# Patient Record
Sex: Male | Born: 1945 | Race: White | Hispanic: No | Marital: Married | Smoking: Former smoker
Health system: Southern US, Community
[De-identification: ages and names within clinical notes are randomized; demographics above are authoritative.]

## PROBLEM LIST (undated history)

## (undated) DIAGNOSIS — Z8489 Family history of other specified conditions: Secondary | ICD-10-CM

## (undated) DIAGNOSIS — K219 Gastro-esophageal reflux disease without esophagitis: Secondary | ICD-10-CM

## (undated) DIAGNOSIS — M549 Dorsalgia, unspecified: Secondary | ICD-10-CM

## (undated) DIAGNOSIS — N400 Enlarged prostate without lower urinary tract symptoms: Secondary | ICD-10-CM

## (undated) DIAGNOSIS — G8929 Other chronic pain: Secondary | ICD-10-CM

## (undated) DIAGNOSIS — M1990 Unspecified osteoarthritis, unspecified site: Secondary | ICD-10-CM

## (undated) DIAGNOSIS — E785 Hyperlipidemia, unspecified: Secondary | ICD-10-CM

## (undated) DIAGNOSIS — I10 Essential (primary) hypertension: Secondary | ICD-10-CM

## (undated) DIAGNOSIS — J189 Pneumonia, unspecified organism: Secondary | ICD-10-CM

## (undated) DIAGNOSIS — M4800 Spinal stenosis, site unspecified: Secondary | ICD-10-CM

## (undated) HISTORY — PX: COLONOSCOPY: SHX174

## (undated) HISTORY — DX: Essential (primary) hypertension: I10

## (undated) HISTORY — PX: FINGER SURGERY: SHX640

## (undated) HISTORY — DX: Hyperlipidemia, unspecified: E78.5

## (undated) HISTORY — DX: Other chronic pain: G89.29

## (undated) HISTORY — DX: Spinal stenosis, site unspecified: M48.00

## (undated) HISTORY — DX: Benign prostatic hyperplasia without lower urinary tract symptoms: N40.0

## (undated) HISTORY — DX: Dorsalgia, unspecified: M54.9

---

## 2000-04-04 ENCOUNTER — Ambulatory Visit (HOSPITAL_COMMUNITY): Admission: RE | Admit: 2000-04-04 | Discharge: 2000-04-04 | Payer: Self-pay | Admitting: Family Medicine

## 2000-04-04 ENCOUNTER — Encounter: Payer: Self-pay | Admitting: Family Medicine

## 2005-02-25 ENCOUNTER — Encounter: Admission: RE | Admit: 2005-02-25 | Discharge: 2005-02-25 | Payer: Self-pay | Admitting: Family Medicine

## 2008-06-27 ENCOUNTER — Encounter: Admission: RE | Admit: 2008-06-27 | Discharge: 2008-06-27 | Payer: Self-pay | Admitting: Family Medicine

## 2010-10-18 ENCOUNTER — Encounter: Payer: Self-pay | Admitting: Family Medicine

## 2011-09-02 ENCOUNTER — Ambulatory Visit
Admission: RE | Admit: 2011-09-02 | Discharge: 2011-09-02 | Disposition: A | Discharge status: Home / Self Care | Payer: Medicare Other | Source: Ambulatory Visit | Attending: Family Medicine | Admitting: Family Medicine

## 2011-09-02 ENCOUNTER — Other Ambulatory Visit: Payer: Self-pay | Admitting: Family Medicine

## 2011-09-02 DIAGNOSIS — M545 Low back pain, unspecified: Secondary | ICD-10-CM

## 2011-11-03 ENCOUNTER — Other Ambulatory Visit: Payer: Self-pay | Admitting: Family Medicine

## 2011-11-03 DIAGNOSIS — M545 Low back pain, unspecified: Secondary | ICD-10-CM | POA: Diagnosis not present

## 2011-11-03 DIAGNOSIS — M5430 Sciatica, unspecified side: Secondary | ICD-10-CM | POA: Diagnosis not present

## 2011-11-10 ENCOUNTER — Ambulatory Visit
Admission: RE | Admit: 2011-11-10 | Discharge: 2011-11-10 | Disposition: A | Discharge status: Home / Self Care | Payer: Medicare Other | Source: Ambulatory Visit | Attending: Family Medicine | Admitting: Family Medicine

## 2011-11-10 DIAGNOSIS — M5430 Sciatica, unspecified side: Secondary | ICD-10-CM

## 2011-11-10 DIAGNOSIS — M5137 Other intervertebral disc degeneration, lumbosacral region: Secondary | ICD-10-CM | POA: Diagnosis not present

## 2011-11-10 DIAGNOSIS — M47817 Spondylosis without myelopathy or radiculopathy, lumbosacral region: Secondary | ICD-10-CM | POA: Diagnosis not present

## 2011-11-10 DIAGNOSIS — M5126 Other intervertebral disc displacement, lumbar region: Secondary | ICD-10-CM | POA: Diagnosis not present

## 2011-11-22 DIAGNOSIS — M545 Low back pain, unspecified: Secondary | ICD-10-CM | POA: Diagnosis not present

## 2011-11-22 DIAGNOSIS — M5137 Other intervertebral disc degeneration, lumbosacral region: Secondary | ICD-10-CM | POA: Diagnosis not present

## 2011-11-22 DIAGNOSIS — IMO0002 Reserved for concepts with insufficient information to code with codable children: Secondary | ICD-10-CM | POA: Diagnosis not present

## 2011-11-22 DIAGNOSIS — M48061 Spinal stenosis, lumbar region without neurogenic claudication: Secondary | ICD-10-CM | POA: Diagnosis not present

## 2011-11-22 DIAGNOSIS — M47817 Spondylosis without myelopathy or radiculopathy, lumbosacral region: Secondary | ICD-10-CM | POA: Diagnosis not present

## 2012-01-17 DIAGNOSIS — M5137 Other intervertebral disc degeneration, lumbosacral region: Secondary | ICD-10-CM | POA: Diagnosis not present

## 2012-01-17 DIAGNOSIS — IMO0002 Reserved for concepts with insufficient information to code with codable children: Secondary | ICD-10-CM | POA: Diagnosis not present

## 2012-01-17 DIAGNOSIS — M47817 Spondylosis without myelopathy or radiculopathy, lumbosacral region: Secondary | ICD-10-CM | POA: Diagnosis not present

## 2012-01-17 DIAGNOSIS — M48061 Spinal stenosis, lumbar region without neurogenic claudication: Secondary | ICD-10-CM | POA: Diagnosis not present

## 2012-02-02 DIAGNOSIS — M545 Low back pain, unspecified: Secondary | ICD-10-CM | POA: Diagnosis not present

## 2012-02-02 DIAGNOSIS — M48061 Spinal stenosis, lumbar region without neurogenic claudication: Secondary | ICD-10-CM | POA: Diagnosis not present

## 2012-02-02 DIAGNOSIS — M5137 Other intervertebral disc degeneration, lumbosacral region: Secondary | ICD-10-CM | POA: Diagnosis not present

## 2012-02-02 DIAGNOSIS — M47817 Spondylosis without myelopathy or radiculopathy, lumbosacral region: Secondary | ICD-10-CM | POA: Diagnosis not present

## 2012-02-02 DIAGNOSIS — M4310 Spondylolisthesis, site unspecified: Secondary | ICD-10-CM | POA: Diagnosis not present

## 2012-02-16 DIAGNOSIS — M4310 Spondylolisthesis, site unspecified: Secondary | ICD-10-CM | POA: Diagnosis not present

## 2012-02-16 DIAGNOSIS — M48061 Spinal stenosis, lumbar region without neurogenic claudication: Secondary | ICD-10-CM | POA: Diagnosis not present

## 2012-02-16 DIAGNOSIS — M5137 Other intervertebral disc degeneration, lumbosacral region: Secondary | ICD-10-CM | POA: Diagnosis not present

## 2012-02-16 DIAGNOSIS — M47817 Spondylosis without myelopathy or radiculopathy, lumbosacral region: Secondary | ICD-10-CM | POA: Diagnosis not present

## 2012-02-23 DIAGNOSIS — I10 Essential (primary) hypertension: Secondary | ICD-10-CM | POA: Diagnosis not present

## 2012-02-23 DIAGNOSIS — Z125 Encounter for screening for malignant neoplasm of prostate: Secondary | ICD-10-CM | POA: Diagnosis not present

## 2012-02-23 DIAGNOSIS — M545 Low back pain, unspecified: Secondary | ICD-10-CM | POA: Diagnosis not present

## 2012-02-23 DIAGNOSIS — Z23 Encounter for immunization: Secondary | ICD-10-CM | POA: Diagnosis not present

## 2012-02-23 DIAGNOSIS — R7301 Impaired fasting glucose: Secondary | ICD-10-CM | POA: Diagnosis not present

## 2012-02-23 DIAGNOSIS — E782 Mixed hyperlipidemia: Secondary | ICD-10-CM | POA: Diagnosis not present

## 2012-02-23 DIAGNOSIS — Z0000 Encounter for general adult medical examination without abnormal findings: Secondary | ICD-10-CM | POA: Diagnosis not present

## 2012-02-23 DIAGNOSIS — Z1211 Encounter for screening for malignant neoplasm of colon: Secondary | ICD-10-CM | POA: Diagnosis not present

## 2012-03-16 DIAGNOSIS — Z87891 Personal history of nicotine dependence: Secondary | ICD-10-CM | POA: Diagnosis not present

## 2012-04-06 DIAGNOSIS — Z86010 Personal history of colonic polyps: Secondary | ICD-10-CM | POA: Diagnosis not present

## 2012-04-10 DIAGNOSIS — M48061 Spinal stenosis, lumbar region without neurogenic claudication: Secondary | ICD-10-CM | POA: Diagnosis not present

## 2012-07-04 DIAGNOSIS — H5310 Unspecified subjective visual disturbances: Secondary | ICD-10-CM | POA: Diagnosis not present

## 2012-07-04 DIAGNOSIS — H5210 Myopia, unspecified eye: Secondary | ICD-10-CM | POA: Diagnosis not present

## 2012-07-04 DIAGNOSIS — H259 Unspecified age-related cataract: Secondary | ICD-10-CM | POA: Diagnosis not present

## 2012-07-04 DIAGNOSIS — H524 Presbyopia: Secondary | ICD-10-CM | POA: Diagnosis not present

## 2012-08-07 DIAGNOSIS — H25019 Cortical age-related cataract, unspecified eye: Secondary | ICD-10-CM | POA: Diagnosis not present

## 2012-08-07 DIAGNOSIS — H269 Unspecified cataract: Secondary | ICD-10-CM | POA: Diagnosis not present

## 2012-08-07 DIAGNOSIS — H2510 Age-related nuclear cataract, unspecified eye: Secondary | ICD-10-CM | POA: Diagnosis not present

## 2012-08-07 DIAGNOSIS — H2181 Floppy iris syndrome: Secondary | ICD-10-CM | POA: Diagnosis not present

## 2012-09-11 DIAGNOSIS — E782 Mixed hyperlipidemia: Secondary | ICD-10-CM | POA: Diagnosis not present

## 2012-09-11 DIAGNOSIS — I10 Essential (primary) hypertension: Secondary | ICD-10-CM | POA: Diagnosis not present

## 2012-09-11 DIAGNOSIS — E669 Obesity, unspecified: Secondary | ICD-10-CM | POA: Diagnosis not present

## 2012-09-11 DIAGNOSIS — R7301 Impaired fasting glucose: Secondary | ICD-10-CM | POA: Diagnosis not present

## 2013-05-15 DIAGNOSIS — H259 Unspecified age-related cataract: Secondary | ICD-10-CM | POA: Diagnosis not present

## 2013-05-15 DIAGNOSIS — Z961 Presence of intraocular lens: Secondary | ICD-10-CM | POA: Diagnosis not present

## 2013-05-15 DIAGNOSIS — H04129 Dry eye syndrome of unspecified lacrimal gland: Secondary | ICD-10-CM | POA: Diagnosis not present

## 2013-05-15 DIAGNOSIS — H5231 Anisometropia: Secondary | ICD-10-CM | POA: Diagnosis not present

## 2013-05-22 DIAGNOSIS — S61209A Unspecified open wound of unspecified finger without damage to nail, initial encounter: Secondary | ICD-10-CM | POA: Diagnosis not present

## 2013-05-22 DIAGNOSIS — Z1211 Encounter for screening for malignant neoplasm of colon: Secondary | ICD-10-CM | POA: Diagnosis not present

## 2013-05-22 DIAGNOSIS — Z23 Encounter for immunization: Secondary | ICD-10-CM | POA: Diagnosis not present

## 2013-05-22 DIAGNOSIS — R7301 Impaired fasting glucose: Secondary | ICD-10-CM | POA: Diagnosis not present

## 2013-05-22 DIAGNOSIS — E782 Mixed hyperlipidemia: Secondary | ICD-10-CM | POA: Diagnosis not present

## 2013-05-22 DIAGNOSIS — Z1331 Encounter for screening for depression: Secondary | ICD-10-CM | POA: Diagnosis not present

## 2013-05-22 DIAGNOSIS — I10 Essential (primary) hypertension: Secondary | ICD-10-CM | POA: Diagnosis not present

## 2013-05-22 DIAGNOSIS — Z125 Encounter for screening for malignant neoplasm of prostate: Secondary | ICD-10-CM | POA: Diagnosis not present

## 2013-05-22 DIAGNOSIS — Z0000 Encounter for general adult medical examination without abnormal findings: Secondary | ICD-10-CM | POA: Diagnosis not present

## 2013-05-28 DIAGNOSIS — Z1211 Encounter for screening for malignant neoplasm of colon: Secondary | ICD-10-CM | POA: Diagnosis not present

## 2013-08-07 DIAGNOSIS — I10 Essential (primary) hypertension: Secondary | ICD-10-CM | POA: Diagnosis not present

## 2013-08-07 DIAGNOSIS — R42 Dizziness and giddiness: Secondary | ICD-10-CM | POA: Diagnosis not present

## 2013-11-18 DIAGNOSIS — H5231 Anisometropia: Secondary | ICD-10-CM | POA: Diagnosis not present

## 2013-11-18 DIAGNOSIS — H259 Unspecified age-related cataract: Secondary | ICD-10-CM | POA: Diagnosis not present

## 2013-11-18 DIAGNOSIS — H524 Presbyopia: Secondary | ICD-10-CM | POA: Diagnosis not present

## 2013-11-18 DIAGNOSIS — Z961 Presence of intraocular lens: Secondary | ICD-10-CM | POA: Diagnosis not present

## 2013-11-25 DIAGNOSIS — I10 Essential (primary) hypertension: Secondary | ICD-10-CM | POA: Diagnosis not present

## 2013-11-25 DIAGNOSIS — R7301 Impaired fasting glucose: Secondary | ICD-10-CM | POA: Diagnosis not present

## 2013-11-25 DIAGNOSIS — Z23 Encounter for immunization: Secondary | ICD-10-CM | POA: Diagnosis not present

## 2013-11-25 DIAGNOSIS — E782 Mixed hyperlipidemia: Secondary | ICD-10-CM | POA: Diagnosis not present

## 2013-12-03 DIAGNOSIS — H25049 Posterior subcapsular polar age-related cataract, unspecified eye: Secondary | ICD-10-CM | POA: Diagnosis not present

## 2013-12-03 DIAGNOSIS — H2510 Age-related nuclear cataract, unspecified eye: Secondary | ICD-10-CM | POA: Diagnosis not present

## 2013-12-03 DIAGNOSIS — H25019 Cortical age-related cataract, unspecified eye: Secondary | ICD-10-CM | POA: Diagnosis not present

## 2013-12-03 DIAGNOSIS — H269 Unspecified cataract: Secondary | ICD-10-CM | POA: Diagnosis not present

## 2014-05-27 DIAGNOSIS — J029 Acute pharyngitis, unspecified: Secondary | ICD-10-CM | POA: Diagnosis not present

## 2014-05-27 DIAGNOSIS — I10 Essential (primary) hypertension: Secondary | ICD-10-CM | POA: Diagnosis not present

## 2014-05-27 DIAGNOSIS — J019 Acute sinusitis, unspecified: Secondary | ICD-10-CM | POA: Diagnosis not present

## 2014-06-03 DIAGNOSIS — Z1211 Encounter for screening for malignant neoplasm of colon: Secondary | ICD-10-CM | POA: Diagnosis not present

## 2014-06-03 DIAGNOSIS — R7301 Impaired fasting glucose: Secondary | ICD-10-CM | POA: Diagnosis not present

## 2014-06-03 DIAGNOSIS — Z23 Encounter for immunization: Secondary | ICD-10-CM | POA: Diagnosis not present

## 2014-06-03 DIAGNOSIS — Z125 Encounter for screening for malignant neoplasm of prostate: Secondary | ICD-10-CM | POA: Diagnosis not present

## 2014-06-03 DIAGNOSIS — E782 Mixed hyperlipidemia: Secondary | ICD-10-CM | POA: Diagnosis not present

## 2014-06-03 DIAGNOSIS — R972 Elevated prostate specific antigen [PSA]: Secondary | ICD-10-CM | POA: Diagnosis not present

## 2014-06-03 DIAGNOSIS — I10 Essential (primary) hypertension: Secondary | ICD-10-CM | POA: Diagnosis not present

## 2014-06-03 DIAGNOSIS — Z0000 Encounter for general adult medical examination without abnormal findings: Secondary | ICD-10-CM | POA: Diagnosis not present

## 2014-06-11 DIAGNOSIS — J019 Acute sinusitis, unspecified: Secondary | ICD-10-CM | POA: Diagnosis not present

## 2014-06-11 DIAGNOSIS — H6500 Acute serous otitis media, unspecified ear: Secondary | ICD-10-CM | POA: Diagnosis not present

## 2014-06-11 DIAGNOSIS — I499 Cardiac arrhythmia, unspecified: Secondary | ICD-10-CM | POA: Diagnosis not present

## 2014-07-22 DIAGNOSIS — R972 Elevated prostate specific antigen [PSA]: Secondary | ICD-10-CM | POA: Diagnosis not present

## 2014-08-27 DIAGNOSIS — R972 Elevated prostate specific antigen [PSA]: Secondary | ICD-10-CM | POA: Diagnosis not present

## 2014-09-02 DIAGNOSIS — R972 Elevated prostate specific antigen [PSA]: Secondary | ICD-10-CM | POA: Diagnosis not present

## 2015-01-28 DIAGNOSIS — J018 Other acute sinusitis: Secondary | ICD-10-CM | POA: Diagnosis not present

## 2015-01-28 DIAGNOSIS — R05 Cough: Secondary | ICD-10-CM | POA: Diagnosis not present

## 2015-06-29 DIAGNOSIS — Z23 Encounter for immunization: Secondary | ICD-10-CM | POA: Diagnosis not present

## 2015-06-29 DIAGNOSIS — Z0000 Encounter for general adult medical examination without abnormal findings: Secondary | ICD-10-CM | POA: Diagnosis not present

## 2015-06-29 DIAGNOSIS — L309 Dermatitis, unspecified: Secondary | ICD-10-CM | POA: Diagnosis not present

## 2015-06-29 DIAGNOSIS — Z125 Encounter for screening for malignant neoplasm of prostate: Secondary | ICD-10-CM | POA: Diagnosis not present

## 2015-06-29 DIAGNOSIS — M545 Low back pain: Secondary | ICD-10-CM | POA: Diagnosis not present

## 2015-06-29 DIAGNOSIS — E669 Obesity, unspecified: Secondary | ICD-10-CM | POA: Diagnosis not present

## 2015-06-29 DIAGNOSIS — I10 Essential (primary) hypertension: Secondary | ICD-10-CM | POA: Diagnosis not present

## 2015-06-29 DIAGNOSIS — Z1211 Encounter for screening for malignant neoplasm of colon: Secondary | ICD-10-CM | POA: Diagnosis not present

## 2015-06-29 DIAGNOSIS — N400 Enlarged prostate without lower urinary tract symptoms: Secondary | ICD-10-CM | POA: Diagnosis not present

## 2015-06-29 DIAGNOSIS — E782 Mixed hyperlipidemia: Secondary | ICD-10-CM | POA: Diagnosis not present

## 2015-06-29 DIAGNOSIS — R7301 Impaired fasting glucose: Secondary | ICD-10-CM | POA: Diagnosis not present

## 2015-06-29 DIAGNOSIS — Z6830 Body mass index (BMI) 30.0-30.9, adult: Secondary | ICD-10-CM | POA: Diagnosis not present

## 2015-09-01 DIAGNOSIS — R972 Elevated prostate specific antigen [PSA]: Secondary | ICD-10-CM | POA: Diagnosis not present

## 2015-09-08 DIAGNOSIS — R972 Elevated prostate specific antigen [PSA]: Secondary | ICD-10-CM | POA: Diagnosis not present

## 2015-09-08 DIAGNOSIS — N400 Enlarged prostate without lower urinary tract symptoms: Secondary | ICD-10-CM | POA: Diagnosis not present

## 2016-02-01 DIAGNOSIS — E669 Obesity, unspecified: Secondary | ICD-10-CM | POA: Diagnosis not present

## 2016-02-01 DIAGNOSIS — I10 Essential (primary) hypertension: Secondary | ICD-10-CM | POA: Diagnosis not present

## 2016-02-01 DIAGNOSIS — J45909 Unspecified asthma, uncomplicated: Secondary | ICD-10-CM | POA: Diagnosis not present

## 2016-02-01 DIAGNOSIS — R7301 Impaired fasting glucose: Secondary | ICD-10-CM | POA: Diagnosis not present

## 2016-02-01 DIAGNOSIS — Z6831 Body mass index (BMI) 31.0-31.9, adult: Secondary | ICD-10-CM | POA: Diagnosis not present

## 2016-02-01 DIAGNOSIS — E782 Mixed hyperlipidemia: Secondary | ICD-10-CM | POA: Diagnosis not present

## 2016-06-02 DIAGNOSIS — J029 Acute pharyngitis, unspecified: Secondary | ICD-10-CM | POA: Diagnosis not present

## 2016-06-02 DIAGNOSIS — H66002 Acute suppurative otitis media without spontaneous rupture of ear drum, left ear: Secondary | ICD-10-CM | POA: Diagnosis not present

## 2016-06-07 DIAGNOSIS — J069 Acute upper respiratory infection, unspecified: Secondary | ICD-10-CM | POA: Diagnosis not present

## 2016-06-14 DIAGNOSIS — N411 Chronic prostatitis: Secondary | ICD-10-CM | POA: Diagnosis not present

## 2016-08-31 DIAGNOSIS — E782 Mixed hyperlipidemia: Secondary | ICD-10-CM | POA: Diagnosis not present

## 2016-08-31 DIAGNOSIS — M545 Low back pain: Secondary | ICD-10-CM | POA: Diagnosis not present

## 2016-08-31 DIAGNOSIS — Z0000 Encounter for general adult medical examination without abnormal findings: Secondary | ICD-10-CM | POA: Diagnosis not present

## 2016-08-31 DIAGNOSIS — Z23 Encounter for immunization: Secondary | ICD-10-CM | POA: Diagnosis not present

## 2016-08-31 DIAGNOSIS — R972 Elevated prostate specific antigen [PSA]: Secondary | ICD-10-CM | POA: Diagnosis not present

## 2016-08-31 DIAGNOSIS — E669 Obesity, unspecified: Secondary | ICD-10-CM | POA: Diagnosis not present

## 2016-08-31 DIAGNOSIS — R7301 Impaired fasting glucose: Secondary | ICD-10-CM | POA: Diagnosis not present

## 2016-08-31 DIAGNOSIS — L309 Dermatitis, unspecified: Secondary | ICD-10-CM | POA: Diagnosis not present

## 2016-08-31 DIAGNOSIS — Z1211 Encounter for screening for malignant neoplasm of colon: Secondary | ICD-10-CM | POA: Diagnosis not present

## 2016-08-31 DIAGNOSIS — I10 Essential (primary) hypertension: Secondary | ICD-10-CM | POA: Diagnosis not present

## 2016-08-31 DIAGNOSIS — N400 Enlarged prostate without lower urinary tract symptoms: Secondary | ICD-10-CM | POA: Diagnosis not present

## 2016-09-07 DIAGNOSIS — R972 Elevated prostate specific antigen [PSA]: Secondary | ICD-10-CM | POA: Diagnosis not present

## 2016-09-07 DIAGNOSIS — N400 Enlarged prostate without lower urinary tract symptoms: Secondary | ICD-10-CM | POA: Diagnosis not present

## 2017-03-13 DIAGNOSIS — E782 Mixed hyperlipidemia: Secondary | ICD-10-CM | POA: Diagnosis not present

## 2017-03-13 DIAGNOSIS — D692 Other nonthrombocytopenic purpura: Secondary | ICD-10-CM | POA: Diagnosis not present

## 2017-03-13 DIAGNOSIS — E669 Obesity, unspecified: Secondary | ICD-10-CM | POA: Diagnosis not present

## 2017-03-13 DIAGNOSIS — R7301 Impaired fasting glucose: Secondary | ICD-10-CM | POA: Diagnosis not present

## 2017-03-13 DIAGNOSIS — I10 Essential (primary) hypertension: Secondary | ICD-10-CM | POA: Diagnosis not present

## 2017-07-03 DIAGNOSIS — L570 Actinic keratosis: Secondary | ICD-10-CM | POA: Diagnosis not present

## 2017-07-03 DIAGNOSIS — D1801 Hemangioma of skin and subcutaneous tissue: Secondary | ICD-10-CM | POA: Diagnosis not present

## 2017-07-03 DIAGNOSIS — C44612 Basal cell carcinoma of skin of right upper limb, including shoulder: Secondary | ICD-10-CM | POA: Diagnosis not present

## 2017-07-03 DIAGNOSIS — D692 Other nonthrombocytopenic purpura: Secondary | ICD-10-CM | POA: Diagnosis not present

## 2017-07-03 DIAGNOSIS — D485 Neoplasm of uncertain behavior of skin: Secondary | ICD-10-CM | POA: Diagnosis not present

## 2017-07-03 DIAGNOSIS — L821 Other seborrheic keratosis: Secondary | ICD-10-CM | POA: Diagnosis not present

## 2017-07-25 ENCOUNTER — Other Ambulatory Visit: Payer: Self-pay | Admitting: Family Medicine

## 2017-07-25 DIAGNOSIS — I10 Essential (primary) hypertension: Secondary | ICD-10-CM

## 2017-08-02 ENCOUNTER — Ambulatory Visit
Admission: RE | Admit: 2017-08-02 | Discharge: 2017-08-02 | Disposition: A | Discharge status: Home / Self Care | Payer: Medicare Other | Source: Ambulatory Visit | Attending: Family Medicine | Admitting: Family Medicine

## 2017-08-02 DIAGNOSIS — I10 Essential (primary) hypertension: Secondary | ICD-10-CM

## 2017-08-02 DIAGNOSIS — N281 Cyst of kidney, acquired: Secondary | ICD-10-CM | POA: Diagnosis not present

## 2017-09-25 DIAGNOSIS — Z1211 Encounter for screening for malignant neoplasm of colon: Secondary | ICD-10-CM | POA: Diagnosis not present

## 2017-09-25 DIAGNOSIS — N400 Enlarged prostate without lower urinary tract symptoms: Secondary | ICD-10-CM | POA: Diagnosis not present

## 2017-09-25 DIAGNOSIS — I10 Essential (primary) hypertension: Secondary | ICD-10-CM | POA: Diagnosis not present

## 2017-09-25 DIAGNOSIS — E782 Mixed hyperlipidemia: Secondary | ICD-10-CM | POA: Diagnosis not present

## 2017-09-25 DIAGNOSIS — D692 Other nonthrombocytopenic purpura: Secondary | ICD-10-CM | POA: Diagnosis not present

## 2017-09-25 DIAGNOSIS — R7301 Impaired fasting glucose: Secondary | ICD-10-CM | POA: Diagnosis not present

## 2017-09-25 DIAGNOSIS — M791 Myalgia, unspecified site: Secondary | ICD-10-CM | POA: Diagnosis not present

## 2017-09-25 DIAGNOSIS — Z125 Encounter for screening for malignant neoplasm of prostate: Secondary | ICD-10-CM | POA: Diagnosis not present

## 2017-09-25 DIAGNOSIS — M4800 Spinal stenosis, site unspecified: Secondary | ICD-10-CM | POA: Diagnosis not present

## 2017-09-25 DIAGNOSIS — Z0000 Encounter for general adult medical examination without abnormal findings: Secondary | ICD-10-CM | POA: Diagnosis not present

## 2017-09-25 DIAGNOSIS — Z1159 Encounter for screening for other viral diseases: Secondary | ICD-10-CM | POA: Diagnosis not present

## 2017-09-25 DIAGNOSIS — E669 Obesity, unspecified: Secondary | ICD-10-CM | POA: Diagnosis not present

## 2017-09-25 DIAGNOSIS — R29898 Other symptoms and signs involving the musculoskeletal system: Secondary | ICD-10-CM | POA: Diagnosis not present

## 2017-10-12 DIAGNOSIS — R69 Illness, unspecified: Secondary | ICD-10-CM | POA: Diagnosis not present

## 2017-11-06 DIAGNOSIS — R69 Illness, unspecified: Secondary | ICD-10-CM | POA: Diagnosis not present

## 2018-01-02 DIAGNOSIS — M546 Pain in thoracic spine: Secondary | ICD-10-CM | POA: Diagnosis not present

## 2018-01-02 DIAGNOSIS — M25552 Pain in left hip: Secondary | ICD-10-CM | POA: Diagnosis not present

## 2018-01-02 DIAGNOSIS — M4156 Other secondary scoliosis, lumbar region: Secondary | ICD-10-CM | POA: Diagnosis not present

## 2018-01-02 DIAGNOSIS — M549 Dorsalgia, unspecified: Secondary | ICD-10-CM | POA: Diagnosis not present

## 2018-01-02 DIAGNOSIS — M47816 Spondylosis without myelopathy or radiculopathy, lumbar region: Secondary | ICD-10-CM | POA: Diagnosis not present

## 2018-01-02 DIAGNOSIS — M5136 Other intervertebral disc degeneration, lumbar region: Secondary | ICD-10-CM | POA: Diagnosis not present

## 2018-01-02 DIAGNOSIS — M1612 Unilateral primary osteoarthritis, left hip: Secondary | ICD-10-CM | POA: Diagnosis not present

## 2018-01-02 DIAGNOSIS — M5416 Radiculopathy, lumbar region: Secondary | ICD-10-CM | POA: Diagnosis not present

## 2018-02-13 DIAGNOSIS — M5416 Radiculopathy, lumbar region: Secondary | ICD-10-CM | POA: Diagnosis not present

## 2018-02-13 DIAGNOSIS — M47816 Spondylosis without myelopathy or radiculopathy, lumbar region: Secondary | ICD-10-CM | POA: Diagnosis not present

## 2018-02-13 DIAGNOSIS — M5136 Other intervertebral disc degeneration, lumbar region: Secondary | ICD-10-CM | POA: Diagnosis not present

## 2018-02-13 DIAGNOSIS — M25552 Pain in left hip: Secondary | ICD-10-CM | POA: Diagnosis not present

## 2018-02-13 DIAGNOSIS — M4156 Other secondary scoliosis, lumbar region: Secondary | ICD-10-CM | POA: Diagnosis not present

## 2018-02-13 DIAGNOSIS — M1612 Unilateral primary osteoarthritis, left hip: Secondary | ICD-10-CM | POA: Diagnosis not present

## 2018-02-20 DIAGNOSIS — M25552 Pain in left hip: Secondary | ICD-10-CM | POA: Diagnosis not present

## 2018-02-20 DIAGNOSIS — M1612 Unilateral primary osteoarthritis, left hip: Secondary | ICD-10-CM | POA: Diagnosis not present

## 2018-02-20 DIAGNOSIS — M5416 Radiculopathy, lumbar region: Secondary | ICD-10-CM | POA: Diagnosis not present

## 2018-02-20 DIAGNOSIS — M545 Low back pain: Secondary | ICD-10-CM | POA: Diagnosis not present

## 2018-02-23 DIAGNOSIS — H9201 Otalgia, right ear: Secondary | ICD-10-CM | POA: Diagnosis not present

## 2018-03-26 DIAGNOSIS — M5416 Radiculopathy, lumbar region: Secondary | ICD-10-CM | POA: Diagnosis not present

## 2018-03-26 DIAGNOSIS — M47816 Spondylosis without myelopathy or radiculopathy, lumbar region: Secondary | ICD-10-CM | POA: Diagnosis not present

## 2018-03-26 DIAGNOSIS — Z6833 Body mass index (BMI) 33.0-33.9, adult: Secondary | ICD-10-CM | POA: Diagnosis not present

## 2018-03-26 DIAGNOSIS — M48062 Spinal stenosis, lumbar region with neurogenic claudication: Secondary | ICD-10-CM | POA: Diagnosis not present

## 2018-03-26 DIAGNOSIS — M5136 Other intervertebral disc degeneration, lumbar region: Secondary | ICD-10-CM | POA: Diagnosis not present

## 2018-03-26 DIAGNOSIS — M4156 Other secondary scoliosis, lumbar region: Secondary | ICD-10-CM | POA: Diagnosis not present

## 2018-04-02 DIAGNOSIS — D692 Other nonthrombocytopenic purpura: Secondary | ICD-10-CM | POA: Diagnosis not present

## 2018-04-02 DIAGNOSIS — M4800 Spinal stenosis, site unspecified: Secondary | ICD-10-CM | POA: Diagnosis not present

## 2018-04-02 DIAGNOSIS — R7301 Impaired fasting glucose: Secondary | ICD-10-CM | POA: Diagnosis not present

## 2018-04-02 DIAGNOSIS — I10 Essential (primary) hypertension: Secondary | ICD-10-CM | POA: Diagnosis not present

## 2018-04-02 DIAGNOSIS — E782 Mixed hyperlipidemia: Secondary | ICD-10-CM | POA: Diagnosis not present

## 2018-04-02 DIAGNOSIS — Z6832 Body mass index (BMI) 32.0-32.9, adult: Secondary | ICD-10-CM | POA: Diagnosis not present

## 2018-04-02 DIAGNOSIS — E669 Obesity, unspecified: Secondary | ICD-10-CM | POA: Diagnosis not present

## 2018-04-12 DIAGNOSIS — M5136 Other intervertebral disc degeneration, lumbar region: Secondary | ICD-10-CM | POA: Diagnosis not present

## 2018-04-12 DIAGNOSIS — M48061 Spinal stenosis, lumbar region without neurogenic claudication: Secondary | ICD-10-CM | POA: Diagnosis not present

## 2018-04-12 DIAGNOSIS — M4726 Other spondylosis with radiculopathy, lumbar region: Secondary | ICD-10-CM | POA: Diagnosis not present

## 2018-05-21 DIAGNOSIS — R69 Illness, unspecified: Secondary | ICD-10-CM | POA: Diagnosis not present

## 2018-05-30 ENCOUNTER — Other Ambulatory Visit: Payer: Self-pay | Admitting: Neurosurgery

## 2018-05-30 DIAGNOSIS — M5416 Radiculopathy, lumbar region: Secondary | ICD-10-CM | POA: Diagnosis not present

## 2018-05-30 DIAGNOSIS — M47816 Spondylosis without myelopathy or radiculopathy, lumbar region: Secondary | ICD-10-CM | POA: Diagnosis not present

## 2018-05-30 DIAGNOSIS — M48062 Spinal stenosis, lumbar region with neurogenic claudication: Secondary | ICD-10-CM | POA: Diagnosis not present

## 2018-05-30 DIAGNOSIS — M4156 Other secondary scoliosis, lumbar region: Secondary | ICD-10-CM | POA: Diagnosis not present

## 2018-05-30 DIAGNOSIS — Z6833 Body mass index (BMI) 33.0-33.9, adult: Secondary | ICD-10-CM | POA: Diagnosis not present

## 2018-05-30 DIAGNOSIS — M5136 Other intervertebral disc degeneration, lumbar region: Secondary | ICD-10-CM | POA: Diagnosis not present

## 2018-07-02 DIAGNOSIS — L570 Actinic keratosis: Secondary | ICD-10-CM | POA: Diagnosis not present

## 2018-07-02 DIAGNOSIS — L821 Other seborrheic keratosis: Secondary | ICD-10-CM | POA: Diagnosis not present

## 2018-07-02 DIAGNOSIS — D171 Benign lipomatous neoplasm of skin and subcutaneous tissue of trunk: Secondary | ICD-10-CM | POA: Diagnosis not present

## 2018-07-02 DIAGNOSIS — Z85828 Personal history of other malignant neoplasm of skin: Secondary | ICD-10-CM | POA: Diagnosis not present

## 2018-07-09 DIAGNOSIS — E782 Mixed hyperlipidemia: Secondary | ICD-10-CM | POA: Diagnosis not present

## 2018-07-19 ENCOUNTER — Other Ambulatory Visit: Payer: Self-pay | Admitting: Neurosurgery

## 2018-07-19 NOTE — Pre-Procedure Instructions (Addendum)
Ruben Rodriguez  07/19/2018    Your procedure is scheduled on Wednesday, August 01, 2018 at 8:30 AM.   Report to [facility] Entrance "A" Admitting Office at 6:30 AM.   Call this number if you have problems the morning of surgery: [phone]   Questions prior to day of surgery, please call [phone] between 8 & 4 PM.   Remember:  Do not eat or drink after midnight Tuesday, 07/31/18.  Take these medicines the morning of surgery with A SIP OF WATER: Amlodipine (Norvasc), Bisoprolol-Hydrochlorothiazide (Ziac), Finasteride (Proscar)  Stop NSAIDS (Diclofenac, Advil, Aleve, etc) 7 days prior to surgery. Do not use any Aspirin products (BC powders, Goodys, etc) or Herbal medications 7 days prior to surgery     Do not wear jewelry.  Do not wear lotions, powders, cologne or deodorant.  Men may shave face and neck.  Do not bring valuables to the hospital.  [facility] is not responsible for any belongings or valuables.  Contacts, dentures or bridgework may not be worn into surgery.  Leave your suitcase in the car.  After surgery it may be brought to your room.  For patients admitted to the hospital, discharge time will be determined by your treatment team.  [facility] - Preparing for Surgery  Before surgery, you can play an important role.  Because skin is not sterile, your skin needs to be as free of germs as possible.  You can reduce the number of germs on you skin by washing with CHG (chlorahexidine gluconate) soap before surgery.  CHG is an antiseptic cleaner which kills germs and bonds with the skin to continue killing germs even after washing.  Oral Hygiene is also important in reducing the risk of infection.  Remember to brush your teeth with your regular toothpaste the morning of surgery.  Please DO NOT use if you have an allergy to CHG or antibacterial soaps.  If your skin becomes reddened/irritated stop using the CHG and inform your nurse when you arrive at Short  Stay.  Do not shave (including legs and underarms) for at least 48 hours prior to the first CHG shower.  You may shave your face.  Please follow these instructions carefully:   1.  Shower with CHG Soap the night before surgery and the morning of Surgery.  2.  If you choose to wash your hair, wash your hair first as usual with your normal shampoo.  3.  After you shampoo, rinse your hair and body thoroughly to remove the shampoo. 4.  Use CHG as you would any other liquid soap.  You can apply chg directly to the skin and wash gently with a      scrungie or washcloth.           5.  Apply the CHG Soap to your body ONLY FROM THE NECK DOWN.   Do not use on open wounds or open sores. Avoid contact with your eyes, ears, mouth and genitals (private parts).  Wash genitals (private parts) with your normal soap.  6.  Wash thoroughly, paying special attention to the area where your surgery will be performed.  7.  Thoroughly rinse your body with warm water from the neck down.  8.  DO NOT shower/wash with your normal soap after using and rinsing off the CHG Soap.  9.  Pat yourself dry with a clean towel.            10.  Wear clean pajamas.  11.  Place clean sheets on your bed the night of your first shower and do not sleep with pets.  Day of Surgery  Shower as above. Do not apply any lotions/deodorants the morning of surgery.   Please wear clean clothes to the hospital. Remember to brush your teeth with toothpaste.  Please read over the fact sheets that you were given.

## 2018-07-20 ENCOUNTER — Other Ambulatory Visit: Payer: Self-pay

## 2018-07-20 ENCOUNTER — Encounter (HOSPITAL_COMMUNITY): Payer: Self-pay

## 2018-07-20 ENCOUNTER — Encounter (HOSPITAL_COMMUNITY)
Admission: RE | Admit: 2018-07-20 | Discharge: 2018-07-20 | Disposition: A | Discharge status: Home / Self Care | Payer: Medicare HMO | Source: Ambulatory Visit | Attending: Neurosurgery | Admitting: Neurosurgery

## 2018-07-20 DIAGNOSIS — Z01818 Encounter for other preprocedural examination: Secondary | ICD-10-CM | POA: Diagnosis not present

## 2018-07-20 DIAGNOSIS — I10 Essential (primary) hypertension: Secondary | ICD-10-CM | POA: Diagnosis not present

## 2018-07-20 HISTORY — DX: Pneumonia, unspecified organism: J18.9

## 2018-07-20 HISTORY — DX: Unspecified osteoarthritis, unspecified site: M19.90

## 2018-07-20 HISTORY — DX: Gastro-esophageal reflux disease without esophagitis: K21.9

## 2018-07-20 HISTORY — DX: Family history of other specified conditions: Z84.89

## 2018-07-20 LAB — CBC
HCT: 43.4 % (ref 39.0–52.0)
HEMOGLOBIN: 13.2 g/dL (ref 13.0–17.0)
MCH: 25.7 pg — ABNORMAL LOW (ref 26.0–34.0)
MCHC: 30.4 g/dL (ref 30.0–36.0)
MCV: 84.6 fL (ref 80.0–100.0)
NRBC: 0 % (ref 0.0–0.2)
PLATELETS: 216 10*3/uL (ref 150–400)
RBC: 5.13 MIL/uL (ref 4.22–5.81)
RDW: 14.7 % (ref 11.5–15.5)
WBC: 7.4 10*3/uL (ref 4.0–10.5)

## 2018-07-20 LAB — BASIC METABOLIC PANEL
Anion gap: 11 (ref 5–15)
BUN: 16 mg/dL (ref 8–23)
CHLORIDE: 105 mmol/L (ref 98–111)
CO2: 26 mmol/L (ref 22–32)
Calcium: 9.4 mg/dL (ref 8.9–10.3)
Creatinine, Ser: 1.17 mg/dL (ref 0.61–1.24)
Glucose, Bld: 81 mg/dL (ref 70–99)
POTASSIUM: 3.7 mmol/L (ref 3.5–5.1)
Sodium: 142 mmol/L (ref 135–145)

## 2018-07-20 LAB — SURGICAL PCR SCREEN
MRSA, PCR: NEGATIVE
Staphylococcus aureus: POSITIVE — AB

## 2018-07-20 LAB — TYPE AND SCREEN
ABO/RH(D): O POS
Antibody Screen: NEGATIVE

## 2018-07-20 LAB — ABO/RH: ABO/RH(D): O POS

## 2018-07-20 NOTE — Progress Notes (Signed)
Mupirocin Ointment Rx called into [facility] in [employer] on [address] for positive PCR of Staph. Pt notified of results and need to pick up Rx.

## 2018-07-20 NOTE — Progress Notes (Signed)
Pt denies cardiac history, chest pain or sob. Pt is treated for HTN. Pt is an 81 mg Aspirin and he states he was told to stop it 7 days prior to surgery by Dr. Sherwood Gambler. Pt's PCP is Dr. Mayra Neer.

## 2018-08-01 ENCOUNTER — Inpatient Hospital Stay (HOSPITAL_COMMUNITY): Payer: Medicare HMO

## 2018-08-01 ENCOUNTER — Other Ambulatory Visit: Payer: Self-pay

## 2018-08-01 ENCOUNTER — Encounter (HOSPITAL_COMMUNITY): Payer: Self-pay | Admitting: Surgery

## 2018-08-01 ENCOUNTER — Inpatient Hospital Stay (HOSPITAL_COMMUNITY): Payer: Medicare HMO | Admitting: Anesthesiology

## 2018-08-01 ENCOUNTER — Inpatient Hospital Stay (HOSPITAL_COMMUNITY)
Admission: RE | Admit: 2018-08-01 | Discharge: 2018-08-02 | DRG: 455 | Disposition: A | Discharge status: Home / Self Care | Payer: Medicare HMO | Attending: Neurosurgery | Admitting: Neurosurgery

## 2018-08-01 ENCOUNTER — Inpatient Hospital Stay (HOSPITAL_COMMUNITY)
Admission: RE | Disposition: A | Discharge status: Home / Self Care | Payer: Self-pay | Source: Home / Self Care | Attending: Neurosurgery

## 2018-08-01 DIAGNOSIS — I10 Essential (primary) hypertension: Secondary | ICD-10-CM | POA: Diagnosis not present

## 2018-08-01 DIAGNOSIS — G8929 Other chronic pain: Secondary | ICD-10-CM | POA: Diagnosis present

## 2018-08-01 DIAGNOSIS — Z87891 Personal history of nicotine dependence: Secondary | ICD-10-CM

## 2018-08-01 DIAGNOSIS — E785 Hyperlipidemia, unspecified: Secondary | ICD-10-CM | POA: Diagnosis present

## 2018-08-01 DIAGNOSIS — Z880 Allergy status to penicillin: Secondary | ICD-10-CM | POA: Diagnosis not present

## 2018-08-01 DIAGNOSIS — M4326 Fusion of spine, lumbar region: Secondary | ICD-10-CM | POA: Diagnosis not present

## 2018-08-01 DIAGNOSIS — M5416 Radiculopathy, lumbar region: Secondary | ICD-10-CM | POA: Diagnosis not present

## 2018-08-01 DIAGNOSIS — M48062 Spinal stenosis, lumbar region with neurogenic claudication: Secondary | ICD-10-CM | POA: Diagnosis not present

## 2018-08-01 DIAGNOSIS — Z888 Allergy status to other drugs, medicaments and biological substances status: Secondary | ICD-10-CM | POA: Diagnosis not present

## 2018-08-01 DIAGNOSIS — M4726 Other spondylosis with radiculopathy, lumbar region: Secondary | ICD-10-CM | POA: Diagnosis not present

## 2018-08-01 DIAGNOSIS — M47816 Spondylosis without myelopathy or radiculopathy, lumbar region: Secondary | ICD-10-CM | POA: Diagnosis not present

## 2018-08-01 DIAGNOSIS — Z79899 Other long term (current) drug therapy: Secondary | ICD-10-CM | POA: Diagnosis not present

## 2018-08-01 DIAGNOSIS — N400 Enlarged prostate without lower urinary tract symptoms: Secondary | ICD-10-CM | POA: Diagnosis present

## 2018-08-01 DIAGNOSIS — Z79891 Long term (current) use of opiate analgesic: Secondary | ICD-10-CM | POA: Diagnosis not present

## 2018-08-01 DIAGNOSIS — Z419 Encounter for procedure for purposes other than remedying health state, unspecified: Secondary | ICD-10-CM

## 2018-08-01 DIAGNOSIS — M48061 Spinal stenosis, lumbar region without neurogenic claudication: Secondary | ICD-10-CM | POA: Diagnosis not present

## 2018-08-01 DIAGNOSIS — K219 Gastro-esophageal reflux disease without esophagitis: Secondary | ICD-10-CM | POA: Diagnosis not present

## 2018-08-01 HISTORY — PX: LAMINECTOMY WITH POSTERIOR LATERAL ARTHRODESIS LEVEL 2: SHX6336

## 2018-08-01 SURGERY — LAMINECTOMY WITH POSTERIOR LATERAL ARTHRODESIS LEVEL 2
Anesthesia: General | Site: Spine Lumbar

## 2018-08-01 MED ORDER — PHENOL 1.4 % MT LIQD: 1.0000 | OROMUCOSAL | Status: DC | PRN

## 2018-08-01 MED ORDER — MIDAZOLAM HCL 2 MG/2ML IJ SOLN
INTRAMUSCULAR | Status: AC
  Filled 2018-08-01: qty 2

## 2018-08-01 MED ORDER — DEXAMETHASONE SODIUM PHOSPHATE 10 MG/ML IJ SOLN
INTRAMUSCULAR | Status: DC | PRN
  Administered 2018-08-01: 10 mg via INTRAVENOUS

## 2018-08-01 MED ORDER — CHLORHEXIDINE GLUCONATE CLOTH 2 % EX PADS: 6.0000 | MEDICATED_PAD | Freq: Once | CUTANEOUS | Status: DC

## 2018-08-01 MED ORDER — ONDANSETRON HCL 4 MG/2ML IJ SOLN
INTRAMUSCULAR | Status: DC | PRN
  Administered 2018-08-01 (×2): 4 mg via INTRAVENOUS

## 2018-08-01 MED ORDER — GENTAMICIN IN SALINE 1.6-0.9 MG/ML-% IV SOLN
80.0000 mg | INTRAVENOUS | Status: AC
  Administered 2018-08-01: 80 mg via INTRAVENOUS
  Filled 2018-08-01: qty 50

## 2018-08-01 MED ORDER — BUPIVACAINE HCL (PF) 0.5 % IJ SOLN
INTRAMUSCULAR | Status: AC
  Filled 2018-08-01: qty 30

## 2018-08-01 MED ORDER — EPHEDRINE SULFATE 50 MG/ML IJ SOLN
INTRAMUSCULAR | Status: DC | PRN
  Administered 2018-08-01 (×5): 10 mg via INTRAVENOUS

## 2018-08-01 MED ORDER — THROMBIN (RECOMBINANT) 20000 UNITS EX SOLR
CUTANEOUS | Status: AC
  Filled 2018-08-01: qty 20000

## 2018-08-01 MED ORDER — LIDOCAINE-EPINEPHRINE 1 %-1:100000 IJ SOLN
INTRAMUSCULAR | Status: AC
  Filled 2018-08-01: qty 1

## 2018-08-01 MED ORDER — VECURONIUM BROMIDE 10 MG IV SOLR
INTRAVENOUS | Status: AC
  Filled 2018-08-01: qty 10

## 2018-08-01 MED ORDER — ONDANSETRON HCL 4 MG/2ML IJ SOLN
INTRAMUSCULAR | Status: AC
  Filled 2018-08-01: qty 4

## 2018-08-01 MED ORDER — FINASTERIDE 5 MG PO TABS: 5.0000 mg | ORAL_TABLET | Freq: Every day | ORAL | Status: DC

## 2018-08-01 MED ORDER — LIDOCAINE 2% (20 MG/ML) 5 ML SYRINGE
INTRAMUSCULAR | Status: DC | PRN
  Administered 2018-08-01: 80 mg via INTRAVENOUS

## 2018-08-01 MED ORDER — SODIUM CHLORIDE 0.9% FLUSH: 3.0000 mL | INTRAVENOUS | Status: DC | PRN

## 2018-08-01 MED ORDER — SODIUM CHLORIDE 0.9 % IV SOLN: 250.0000 mL | INTRAVENOUS | Status: DC

## 2018-08-01 MED ORDER — SUGAMMADEX SODIUM 200 MG/2ML IV SOLN
INTRAVENOUS | Status: DC | PRN
  Administered 2018-08-01: 195 mg via INTRAVENOUS

## 2018-08-01 MED ORDER — HYDROXYZINE HCL 25 MG PO TABS: 50.0000 mg | ORAL_TABLET | ORAL | Status: DC | PRN

## 2018-08-01 MED ORDER — VECURONIUM BROMIDE 10 MG IV SOLR
INTRAVENOUS | Status: DC | PRN
  Administered 2018-08-01: 4 mg via INTRAVENOUS
  Administered 2018-08-01: 1 mg via INTRAVENOUS

## 2018-08-01 MED ORDER — LACTATED RINGERS IV SOLN
INTRAVENOUS | Status: DC | PRN
  Administered 2018-08-01: 08:00:00 via INTRAVENOUS

## 2018-08-01 MED ORDER — ACETAMINOPHEN 10 MG/ML IV SOLN
INTRAVENOUS | Status: DC | PRN
  Administered 2018-08-01: 1000 mg via INTRAVENOUS

## 2018-08-01 MED ORDER — SODIUM CHLORIDE 0.9 % IV SOLN
INTRAVENOUS | Status: DC | PRN
  Administered 2018-08-01: 500 mL

## 2018-08-01 MED ORDER — SODIUM CHLORIDE 0.9% FLUSH: 3.0000 mL | Freq: Two times a day (BID) | INTRAVENOUS | Status: DC

## 2018-08-01 MED ORDER — FENTANYL CITRATE (PF) 250 MCG/5ML IJ SOLN
INTRAMUSCULAR | Status: DC | PRN
  Administered 2018-08-01 (×2): 50 ug via INTRAVENOUS
  Administered 2018-08-01: 100 ug via INTRAVENOUS
  Administered 2018-08-01: 50 ug via INTRAVENOUS

## 2018-08-01 MED ORDER — RAMIPRIL 10 MG PO CAPS
10.0000 mg | ORAL_CAPSULE | Freq: Every day | ORAL | Status: DC
  Filled 2018-08-01: qty 1

## 2018-08-01 MED ORDER — VANCOMYCIN HCL IN DEXTROSE 1-5 GM/200ML-% IV SOLN
1000.0000 mg | INTRAVENOUS | Status: AC
  Administered 2018-08-01: 1000 mg via INTRAVENOUS
  Filled 2018-08-01: qty 200

## 2018-08-01 MED ORDER — KETOROLAC TROMETHAMINE 30 MG/ML IJ SOLN
15.0000 mg | Freq: Once | INTRAMUSCULAR | Status: AC
  Administered 2018-08-01: 15 mg via INTRAVENOUS

## 2018-08-01 MED ORDER — THROMBIN 5000 UNITS EX SOLR
OROMUCOSAL | Status: DC | PRN
  Administered 2018-08-01: 5 mL via TOPICAL

## 2018-08-01 MED ORDER — SODIUM CHLORIDE 0.9 % IV SOLN
INTRAVENOUS | Status: DC | PRN
  Administered 2018-08-01: 25 ug/min via INTRAVENOUS

## 2018-08-01 MED ORDER — THROMBIN 20000 UNITS EX SOLR
CUTANEOUS | Status: DC | PRN
  Administered 2018-08-01: 20 mL via TOPICAL

## 2018-08-01 MED ORDER — HYDROXYZINE HCL 50 MG/ML IM SOLN: 50.0000 mg | INTRAMUSCULAR | Status: DC | PRN

## 2018-08-01 MED ORDER — FLEET ENEMA 7-19 GM/118ML RE ENEM: 1.0000 | ENEMA | Freq: Once | RECTAL | Status: DC | PRN

## 2018-08-01 MED ORDER — FENTANYL CITRATE (PF) 100 MCG/2ML IJ SOLN
25.0000 ug | INTRAMUSCULAR | Status: DC | PRN
  Administered 2018-08-01: 50 ug via INTRAVENOUS

## 2018-08-01 MED ORDER — BUPIVACAINE HCL (PF) 0.5 % IJ SOLN
INTRAMUSCULAR | Status: DC | PRN
  Administered 2018-08-01: 20 mL

## 2018-08-01 MED ORDER — CYCLOBENZAPRINE HCL 5 MG PO TABS
5.0000 mg | ORAL_TABLET | Freq: Three times a day (TID) | ORAL | Status: DC | PRN
  Administered 2018-08-01 – 2018-08-02 (×2): 5 mg via ORAL
  Filled 2018-08-01 (×2): qty 1

## 2018-08-01 MED ORDER — THROMBIN 5000 UNITS EX SOLR
CUTANEOUS | Status: AC
  Filled 2018-08-01: qty 5000

## 2018-08-01 MED ORDER — AMLODIPINE BESYLATE 10 MG PO TABS
10.0000 mg | ORAL_TABLET | Freq: Every day | ORAL | Status: DC
  Filled 2018-08-01: qty 1

## 2018-08-01 MED ORDER — MIDAZOLAM HCL 5 MG/5ML IJ SOLN
INTRAMUSCULAR | Status: DC | PRN
  Administered 2018-08-01: 2 mg via INTRAVENOUS

## 2018-08-01 MED ORDER — HYDROCODONE-ACETAMINOPHEN 5-325 MG PO TABS
1.0000 | ORAL_TABLET | ORAL | Status: DC | PRN
  Administered 2018-08-01 – 2018-08-02 (×5): 1 via ORAL
  Filled 2018-08-01 (×2): qty 1
  Filled 2018-08-01: qty 2
  Filled 2018-08-01 (×2): qty 1

## 2018-08-01 MED ORDER — ACETAMINOPHEN 325 MG PO TABS: 650.0000 mg | ORAL_TABLET | ORAL | Status: DC | PRN

## 2018-08-01 MED ORDER — MAGNESIUM HYDROXIDE 400 MG/5ML PO SUSP: 30.0000 mL | Freq: Every day | ORAL | Status: DC | PRN

## 2018-08-01 MED ORDER — DEXAMETHASONE SODIUM PHOSPHATE 10 MG/ML IJ SOLN
INTRAMUSCULAR | Status: AC
  Filled 2018-08-01: qty 1

## 2018-08-01 MED ORDER — MENTHOL 3 MG MT LOZG: 1.0000 | LOZENGE | OROMUCOSAL | Status: DC | PRN

## 2018-08-01 MED ORDER — ROCURONIUM BROMIDE 50 MG/5ML IV SOSY
PREFILLED_SYRINGE | INTRAVENOUS | Status: DC | PRN
  Administered 2018-08-01: 50 mg via INTRAVENOUS

## 2018-08-01 MED ORDER — ROCURONIUM BROMIDE 50 MG/5ML IV SOSY
PREFILLED_SYRINGE | INTRAVENOUS | Status: AC
  Filled 2018-08-01: qty 5

## 2018-08-01 MED ORDER — LIDOCAINE 2% (20 MG/ML) 5 ML SYRINGE
INTRAMUSCULAR | Status: AC
  Filled 2018-08-01: qty 5

## 2018-08-01 MED ORDER — ACETAMINOPHEN 10 MG/ML IV SOLN
INTRAVENOUS | Status: AC
  Filled 2018-08-01: qty 100

## 2018-08-01 MED ORDER — FENTANYL CITRATE (PF) 250 MCG/5ML IJ SOLN
INTRAMUSCULAR | Status: AC
  Filled 2018-08-01: qty 5

## 2018-08-01 MED ORDER — LIDOCAINE-EPINEPHRINE 1 %-1:100000 IJ SOLN
INTRAMUSCULAR | Status: DC | PRN
  Administered 2018-08-01: 20 mL

## 2018-08-01 MED ORDER — PROPOFOL 10 MG/ML IV BOLUS
INTRAVENOUS | Status: DC | PRN
  Administered 2018-08-01: 150 mg via INTRAVENOUS

## 2018-08-01 MED ORDER — BISOPROLOL-HYDROCHLOROTHIAZIDE 10-6.25 MG PO TABS
1.0000 | ORAL_TABLET | Freq: Every day | ORAL | Status: DC
  Filled 2018-08-01: qty 1

## 2018-08-01 MED ORDER — SODIUM CHLORIDE 0.9 % IV SOLN
INTRAVENOUS | Status: DC | PRN
  Administered 2018-08-01: 13:00:00 via INTRAVENOUS

## 2018-08-01 MED ORDER — ALUM & MAG HYDROXIDE-SIMETH 200-200-20 MG/5ML PO SUSP: 30.0000 mL | Freq: Four times a day (QID) | ORAL | Status: DC | PRN

## 2018-08-01 MED ORDER — FENTANYL CITRATE (PF) 100 MCG/2ML IJ SOLN
INTRAMUSCULAR | Status: AC
  Filled 2018-08-01: qty 2

## 2018-08-01 MED ORDER — ACETAMINOPHEN 650 MG RE SUPP: 650.0000 mg | RECTAL | Status: DC | PRN

## 2018-08-01 MED ORDER — METHYLPREDNISOLONE ACETATE 80 MG/ML IJ SUSP
INTRAMUSCULAR | Status: AC
  Filled 2018-08-01: qty 1

## 2018-08-01 MED ORDER — KETOROLAC TROMETHAMINE 30 MG/ML IJ SOLN
INTRAMUSCULAR | Status: AC
  Filled 2018-08-01: qty 1

## 2018-08-01 MED ORDER — KCL IN DEXTROSE-NACL 20-5-0.45 MEQ/L-%-% IV SOLN: INTRAVENOUS | Status: DC

## 2018-08-01 MED ORDER — 0.9 % SODIUM CHLORIDE (POUR BTL) OPTIME
TOPICAL | Status: DC | PRN
  Administered 2018-08-01 (×3): 1000 mL

## 2018-08-01 MED ORDER — MORPHINE SULFATE (PF) 4 MG/ML IV SOLN: 4.0000 mg | INTRAVENOUS | Status: DC | PRN

## 2018-08-01 MED ORDER — BISACODYL 10 MG RE SUPP: 10.0000 mg | Freq: Every day | RECTAL | Status: DC | PRN

## 2018-08-01 MED ORDER — PROPOFOL 10 MG/ML IV BOLUS
INTRAVENOUS | Status: AC
  Filled 2018-08-01: qty 20

## 2018-08-01 MED ORDER — KETOROLAC TROMETHAMINE 30 MG/ML IJ SOLN
15.0000 mg | Freq: Four times a day (QID) | INTRAMUSCULAR | Status: DC
  Administered 2018-08-01 – 2018-08-02 (×3): 15 mg via INTRAVENOUS
  Filled 2018-08-01 (×3): qty 1

## 2018-08-01 SURGICAL SUPPLY — 78 items
BAG DECANTER FOR FLEXI CONT (MISCELLANEOUS) ×2 IMPLANT
BENZOIN TINCTURE PRP APPL 2/3 (GAUZE/BANDAGES/DRESSINGS) IMPLANT
BLADE CLIPPER SURG (BLADE) ×2 IMPLANT
BUR ACRON 5.0MM COATED (BURR) ×4 IMPLANT
BUR MATCHSTICK NEURO 3.0 LAGG (BURR) ×2 IMPLANT
CANISTER SUCT 3000ML PPV (MISCELLANEOUS) ×2 IMPLANT
CAP LCK SPNE (Orthopedic Implant) ×6 IMPLANT
CAP LOCK SPINE RADIUS (Orthopedic Implant) ×6 IMPLANT
CAP LOCKING (Orthopedic Implant) ×6 IMPLANT
CARTRIDGE OIL MAESTRO DRILL (MISCELLANEOUS) ×1 IMPLANT
CONT SPEC 4OZ CLIKSEAL STRL BL (MISCELLANEOUS) ×2 IMPLANT
COVER BACK TABLE 60X90IN (DRAPES) ×2 IMPLANT
COVER WAND RF STERILE (DRAPES) ×2 IMPLANT
DECANTER SPIKE VIAL GLASS SM (MISCELLANEOUS) ×2 IMPLANT
DERMABOND ADVANCED (GAUZE/BANDAGES/DRESSINGS) ×2
DERMABOND ADVANCED .7 DNX12 (GAUZE/BANDAGES/DRESSINGS) ×2 IMPLANT
DIFFUSER DRILL AIR PNEUMATIC (MISCELLANEOUS) ×2 IMPLANT
DRAPE C-ARM 42X72 X-RAY (DRAPES) ×2 IMPLANT
DRAPE C-ARMOR (DRAPES) ×2 IMPLANT
DRAPE HALF SHEET 40X57 (DRAPES) ×2 IMPLANT
DRAPE LAPAROTOMY 100X72X124 (DRAPES) ×2 IMPLANT
DRAPE POUCH INSTRU U-SHP 10X18 (DRAPES) ×2 IMPLANT
ELECT BLADE 4.0 EZ CLEAN MEGAD (MISCELLANEOUS) ×2
ELECT REM PT RETURN 9FT ADLT (ELECTROSURGICAL) ×2
ELECTRODE BLDE 4.0 EZ CLN MEGD (MISCELLANEOUS) ×1 IMPLANT
ELECTRODE REM PT RTRN 9FT ADLT (ELECTROSURGICAL) ×1 IMPLANT
GAUZE 4X4 16PLY RFD (DISPOSABLE) IMPLANT
GAUZE SPONGE 4X4 12PLY STRL (GAUZE/BANDAGES/DRESSINGS) IMPLANT
GAUZE SPONGE 4X4 16PLY XRAY LF (GAUZE/BANDAGES/DRESSINGS) ×2 IMPLANT
GLOVE BIOGEL PI IND STRL 7.0 (GLOVE) ×2 IMPLANT
GLOVE BIOGEL PI IND STRL 8 (GLOVE) ×5 IMPLANT
GLOVE BIOGEL PI INDICATOR 7.0 (GLOVE) ×2
GLOVE BIOGEL PI INDICATOR 8 (GLOVE) ×5
GLOVE ECLIPSE 7.5 STRL STRAW (GLOVE) ×10 IMPLANT
GOWN STRL REUS W/ TWL LRG LVL3 (GOWN DISPOSABLE) IMPLANT
GOWN STRL REUS W/ TWL XL LVL3 (GOWN DISPOSABLE) ×3 IMPLANT
GOWN STRL REUS W/TWL 2XL LVL3 (GOWN DISPOSABLE) ×2 IMPLANT
GOWN STRL REUS W/TWL LRG LVL3 (GOWN DISPOSABLE)
GOWN STRL REUS W/TWL XL LVL3 (GOWN DISPOSABLE) ×3
HEMOSTAT POWDER KIT SURGIFOAM (HEMOSTASIS) ×2 IMPLANT
KIT BASIN OR (CUSTOM PROCEDURE TRAY) ×2 IMPLANT
KIT INFUSE X SMALL 1.4CC (Orthopedic Implant) ×2 IMPLANT
KIT TURNOVER KIT B (KITS) ×2 IMPLANT
NEEDLE 18GX1X1/2 (RX/OR ONLY) (NEEDLE) ×4 IMPLANT
NEEDLE ASP BONE MRW 8GX15 (NEEDLE) ×2 IMPLANT
NEEDLE SPNL 18GX3.5 QUINCKE PK (NEEDLE) ×2 IMPLANT
NEEDLE SPNL 22GX3.5 QUINCKE BK (NEEDLE) ×2 IMPLANT
NS IRRIG 1000ML POUR BTL (IV SOLUTION) ×6 IMPLANT
OIL CARTRIDGE MAESTRO DRILL (MISCELLANEOUS) ×2
PACK LAMINECTOMY NEURO (CUSTOM PROCEDURE TRAY) ×2 IMPLANT
PAD ARMBOARD 7.5X6 YLW CONV (MISCELLANEOUS) ×6 IMPLANT
PATTIES SURGICAL .5 X.5 (GAUZE/BANDAGES/DRESSINGS) IMPLANT
PATTIES SURGICAL .5 X1 (DISPOSABLE) IMPLANT
PATTIES SURGICAL 1X1 (DISPOSABLE) ×2 IMPLANT
ROD 70MM (Rod) ×2 IMPLANT
ROD CROSS LINK SHORT (Rod) ×2 IMPLANT
ROD SPNL 70X5.5 NS TI RDS (Rod) ×2 IMPLANT
SCREW 5.75X45MM (Screw) ×8 IMPLANT
SCREW 5.75X50MM (Screw) ×4 IMPLANT
SPONGE LAP 4X18 RFD (DISPOSABLE) IMPLANT
SPONGE NEURO XRAY DETECT 1X3 (DISPOSABLE) ×2 IMPLANT
SPONGE SURGIFOAM ABS GEL 100 (HEMOSTASIS) ×2 IMPLANT
STAPLER SKIN PROX WIDE 3.9 (STAPLE) IMPLANT
STRIP BIOACTIVE VITOSS 25X100X (Neuro Prosthesis/Implant) ×2 IMPLANT
STRIP CLOSURE SKIN 1/2X4 (GAUZE/BANDAGES/DRESSINGS) IMPLANT
SUT PROLENE 6 0 BV (SUTURE) IMPLANT
SUT VIC AB 1 CT1 18XBRD ANBCTR (SUTURE) ×3 IMPLANT
SUT VIC AB 1 CT1 8-18 (SUTURE) ×3
SUT VIC AB 2-0 CP2 18 (SUTURE) ×6 IMPLANT
SYR 3ML LL SCALE MARK (SYRINGE) ×8 IMPLANT
SYR 5ML LL (SYRINGE) IMPLANT
SYR CONTROL 10ML LL (SYRINGE) ×2 IMPLANT
TAPE CLOTH SURG 4X10 WHT LF (GAUZE/BANDAGES/DRESSINGS) ×2 IMPLANT
TOWEL GREEN STERILE (TOWEL DISPOSABLE) ×2 IMPLANT
TOWEL GREEN STERILE FF (TOWEL DISPOSABLE) ×2 IMPLANT
TRAP SPECIMEN MUCOUS 40CC (MISCELLANEOUS) IMPLANT
TRAY FOLEY MTR SLVR 16FR STAT (SET/KITS/TRAYS/PACK) ×2 IMPLANT
WATER STERILE IRR 1000ML POUR (IV SOLUTION) ×2 IMPLANT

## 2018-08-01 NOTE — Anesthesia Preprocedure Evaluation (Addendum)
Anesthesia Evaluation  Patient identified by MRN, date of birth, ID band Patient awake    Airway Mallampati: II  TM Distance: >3 FB     Dental   Pulmonary pneumonia, former smoker,    breath sounds clear to auscultation       Cardiovascular hypertension,  Rhythm:Regular Rate:Normal     Neuro/Psych    GI/Hepatic GERD  ,  Endo/Other    Renal/GU      Musculoskeletal   Abdominal   Peds  Hematology   Anesthesia Other Findings   Reproductive/Obstetrics                             Anesthesia Physical Anesthesia Plan  ASA: III  Anesthesia Plan: General   Post-op Pain Management:    Induction: Intravenous  PONV Risk Score and Plan: Treatment may vary due to age or medical condition, Ondansetron and Midazolam  Airway Management Planned: Oral ETT  Additional Equipment:   Intra-op Plan:   Post-operative Plan: Possible Post-op intubation/ventilation  Informed Consent:   Dental advisory given  Plan Discussed with: CRNA and Anesthesiologist  Anesthesia Plan Comments:        Anesthesia Quick Evaluation

## 2018-08-01 NOTE — Progress Notes (Signed)
Vitals:   08/01/18 1417 08/01/18 1432 08/01/18 1502 08/01/18 1525  BP: (!) 98/59 101/65 100/66 122/78  Pulse: 72 67 64 65  Resp: 16 14 14 18   Temp:  98.1 F (36.7 C)  97.7 F (36.5 C)  TempSrc:    Oral  SpO2: 95% 95% 95% 99%  Weight:      Height:        Patient sitting up on the side of the bed, has ambulated good way down the hall.  Dressing clean and dry.  Nursing staff to DC Foley, and monitor voiding function.  Patient notes excellent relief of neurogenic claudication.  Plan: Doing well following surgery.  Encouraged to ambulate.  Continue to progress through postoperative recovery.  Hosie Spangle, MD 08/01/2018, 6:06 PM

## 2018-08-01 NOTE — Op Note (Addendum)
08/01/2018  1:19 PM  PATIENT:  Ruben Rodriguez  72 y.o. male  PRE-OPERATIVE DIAGNOSIS: Multilevel, multifactorial lumbar stenosis; lumbar spondylosis  POST-OPERATIVE DIAGNOSIS:  Multilevel, multifactorial lumbar stenosis; lumbar spondylosis  PROCEDURE:  Procedure(s): L3, L4, and L5 decompressive lumbar laminectomy, with bilateral facetectomies and foraminotomies for the exiting L3, L4, L5 nerve roots; L3-L5 posterior lateral arthrodesis with segmental radius posterior instrumentation, locally harvested morselized autograft, Vitoss BA with bone marrow aspirate, and infuse  SURGEON: Jovita Gamma, MD  ASSISTANTS: Kary Kos, MD  ANESTHESIA:   general  EBL:  Total I/O In: 1300 [I.V.:1150; Blood:150] Out: 1010 [Urine:560; Blood:450]  BLOOD ADMINISTERED:150 CC CELLSAVER  COUNT:  Correct per nursing staff  DICTATION: Patient was brought to the operating room placed under general endotracheal anesthesia. The patient was turned to prone position, the lumbar region was prepped with Betadine soap and solution and draped in a sterile fashion. The midline was infiltrated with local anesthesia with epinephrine. A localizing x-ray was taken and then a midline incision was made and carried down through the subcutaneous tissue, bipolar cautery and electrocautery were used to maintain hemostasis. Dissection was carried down to the lumbar fascia. The fascia was incised bilaterally and the paraspinal muscles were dissected with a spinous process and lamina in a subperiosteal fashion. Another x-ray was taken for localization and the L3, L4, L5 levels were localized. Dissection was then carried out laterally over the facet complexes, bilateral facetectomies at L3-4 and L4-5 were begun with the high-speed drill, and the transverse processes of L3, L4, L5 were exposed and decorticated.  We then proceeded with the L3, L4, and L5 decompressive lumbar laminectomy using double-action rongeurs, the high-speed drill  and Kerrison punches.  Thickened ligament of flavum was carefully removed, decompressing the stenotic compression of the thecal sac.  Dissection was carried out laterally, extending the bilateral facetectomies.  We then performed foraminotomies for decompression of the stenotic compression of the exiting L3, L4, and L5 nerve roots bilaterally.  Hemostasis established with bipolar cautery and Gelfoam and thrombin.  The Gelfoam was removed, and a thin layer of Surgifoam applied.  We then proceeded with the posterior lateral arthrodesis.  The C-arm fluoroscope was then draped and brought in the field and we identified the pedicle entry points bilaterally at the L3, L4, and L5 levels. Each of the 6 pedicles was probed, we aspirated bone marrow aspirate from the vertebral bodies, this was injected over a 10 cc strip of Vitoss BA. Then each of the pedicles was examined with the ball probe, good bony surfaces were found and no bony cuts were found. Each of the pedicles was then tapped with a 5.25 mm tap, again examined with the ball probe good threading was found and no bony cuts were found. We then placed 5.75 by 50 millimeter screws bilaterally at the L3 level, 5.75 by 45 millimeter screws bilaterally at the L4 level, and 5.75 by 45 millimeter screws bilaterally at the L5 level.  We then packed the lateral gutter over the transverse processes and intertransverse space with locally harvested morselized autograft, Vitoss BA with bone marrow aspirate, and infuse. We then selected 70 mm hyper lordosed rods, they were placed within the screw heads and secured with locking caps.  Once all 6 locking caps were placed, final tightening was performed against a counter torque.  We then selected a small cross connector.  It was secured to the rods between the L4 and L5 screw heads.  The central locking collar was tightened  down.  The wound had been irrigated multiple times during the procedure with saline solution and  bacitracin solution, good hemostasis was established with a combination of bipolar cautery and Gelfoam with thrombin. Once good hemostasis was confirmed we proceeded with closure paraspinal muscles deep fascia and Scarpa's fascia were closed with interrupted undyed 1 Vicryl sutures the subcutaneous and subcuticular closed with interrupted inverted 2-0 undyed Vicryl sutures the skin edges were approximated with Dermabond.  Wound was dressed with sterile gauze and Hypafix.  Following surgery the patient was turned back to the supine position to be reversed and the anesthetic extubated and transferred to the recovery room for further care.   PLAN OF CARE: Admit to inpatient   PATIENT DISPOSITION:  PACU - hemodynamically stable.   Delay start of Pharmacological VTE agent (>24hrs) due to surgical blood loss or risk of bleeding:  yes

## 2018-08-01 NOTE — Transfer of Care (Signed)
Immediate Anesthesia Transfer of Care Note  Patient: [place]  Procedure(s) Performed: LUMBAR THREE- LUMBAR FOUR, LUMBAR FOUR-LUMBAR FIVE DECOMPRESSION, LUMBAR THREE-LUMBAR FOUR, LUMBAR FOUR-LUMBAR FIVE POSTEROLATERAL ARTHRODESIS (N/A Spine Lumbar)  Patient Location: PACU  Anesthesia Type:General  Level of Consciousness: awake, alert , oriented and patient cooperative  Airway & Oxygen Therapy: Patient Spontanous Breathing and Patient connected to nasal cannula oxygen  Post-op Assessment: Report given to RN and Post -op Vital signs reviewed and stable  Post vital signs: Reviewed and stable  Last Vitals:  Vitals Value Taken Time  BP 127/84 08/01/2018  1:32 PM  Temp 36.7 C 08/01/2018  1:32 PM  Pulse 88 08/01/2018  1:35 PM  Resp 21 08/01/2018  1:35 PM  SpO2 97 % 08/01/2018  1:35 PM  Vitals shown include unvalidated device data.  Last Pain:  Vitals:   08/01/18 0740  TempSrc:   PainSc: 5       Patients Stated Pain Goal: 5 (43/[id number])  Complications: No apparent anesthesia complications

## 2018-08-01 NOTE — H&P (Signed)
Subjective: Patient is a 72 y.o. right-handed white male who is admitted for treatment of multilevel, multifactorial lumbar stenosis with neurogenic claudication.  Patient's been having difficulties with low back pain and bilateral lumbar radicular pain, left worse than right.  He has been treated extensively for the past year with steroid Dosepaks, NSAIDs, epidural steroid injections, and water aerobics with persistent, and in fact worsening pain.  He is admitted now at L3-L5 decompressive lumbar laminectomy and an L3-L5 posterior lateral arthrodesis with posterior instrumentation and bone graft.  Past Medical History:  Diagnosis Date  . Arthritis   . BPH (benign prostatic hyperplasia)   . Chronic back pain   . Family history of adverse reaction to anesthesia    father died during carotid artery surgery  . GERD (gastroesophageal reflux disease)   . Hyperlipidemia   . Hypertension   . Pneumonia   . Spinal stenosis     Past Surgical History:  Procedure Laterality Date  . COLONOSCOPY    . FINGER SURGERY     to remove a tumor from right little finger    Medications Prior to Admission  Medication Sig Dispense Refill Last Dose  . amLODipine (NORVASC) 10 MG tablet Take 10 mg by mouth daily.   08/01/2018 at 0530  . aspirin 81 MG tablet Take 81 mg by mouth daily.   Past Month at Unknown time  . bisoprolol-hydrochlorothiazide (ZIAC) 10-6.25 MG per tablet Take 1 tablet by mouth daily.   08/01/2018 at 0530  . diclofenac (VOLTAREN) 75 MG EC tablet Take 75 mg by mouth 2 (two) times daily.   Past Month at Unknown time  . finasteride (PROSCAR) 5 MG tablet Take 5 mg by mouth daily.   08/01/2018 at 0530  . fluocinonide cream (LIDEX) 9.38 % Apply 1 application topically daily as needed (itchy ears).    Past Month at Unknown time  . ramipril (ALTACE) 10 MG capsule Take 10 mg by mouth daily.   07/31/2018 at Unknown time  . traMADol (ULTRAM) 50 MG tablet Take 50 mg by mouth every 6 (six) hours as needed for  moderate pain.   07/31/2018 at Unknown time   Allergies  Allergen Reactions  . Lipitor [Atorvastatin] Other (See Comments)    MUSCLE ACHES   . Zetia [Ezetimibe] Other (See Comments)    ACHES   . Zocor [Simvastatin] Other (See Comments)    ACHES  . Other Rash    FISH   . Penicillins Rash    Social History   Tobacco Use  . Smoking status: Former Research scientist (life sciences)  . Smokeless tobacco: Never Used  Substance Use Topics  . Alcohol use: No    History reviewed. No pertinent family history.   Review of Systems Pertinent items noted in HPI and remainder of comprehensive ROS otherwise negative.  Objective: Vital signs in last 24 hours: Temp:  [98.3 F (36.8 C)] 98.3 F (36.8 C) (11/06 0703) Pulse Rate:  [60] 60 (11/06 0703) Resp:  [20] 20 (11/06 0703) BP: (158)/(90) 158/90 (11/06 0703) SpO2:  [99 %] 99 % (11/06 0703) Weight:  [97.5 kg] 97.5 kg (11/06 0703)  EXAM: Patient is a well-developed well-nourished white male in no acute distress.   Lungs are clear to auscultation , the patient has symmetrical respiratory excursion. Heart has a regular rate and rhythm normal S1 and S2 no murmur.   Abdomen is soft nontender nondistended bowel sounds are present. Extremity examination shows no clubbing cyanosis or edema. Motor examination shows 5 over 5 strength  in the lower extremities including the iliopsoas quadriceps dorsiflexor extensor hallicus  longus and plantar flexor bilaterally. Sensation is intact to pinprick in the distal lower extremities. Reflexes are symmetrical bilaterally. No pathologic reflexes are present. Patient has a normal gait and stance.  Data Review:CBC    Component Value Date/Time   WBC 7.4 07/20/2018 1112   RBC 5.13 07/20/2018 1112   HGB 13.2 07/20/2018 1112   HCT 43.4 07/20/2018 1112   PLT 216 07/20/2018 1112   MCV 84.6 07/20/2018 1112   MCH 25.7 (L) 07/20/2018 1112   MCHC 30.4 07/20/2018 1112   RDW 14.7 07/20/2018 1112                          BMET     Component Value Date/Time   NA 142 07/20/2018 1112   K 3.7 07/20/2018 1112   CL 105 07/20/2018 1112   CO2 26 07/20/2018 1112   GLUCOSE 81 07/20/2018 1112   BUN 16 07/20/2018 1112   CREATININE 1.17 07/20/2018 1112   CALCIUM 9.4 07/20/2018 1112   GFRNONAA >60 07/20/2018 1112   GFRAA >60 07/20/2018 1112     Assessment/Plan: Patient with lumbar stenosis with neurogenic claudication who is admitted for lumbar decompression and arthrodesis.  I've discussed with the patient the nature of his condition, the nature the surgical procedure, the typical length of surgery, hospital stay, and overall recuperation, the limitations postoperatively, and risks of surgery. I discussed risks including risks of infection, bleeding, possibly need for transfusion, the risk of nerve root dysfunction with pain, weakness, numbness, or paresthesias, the risk of dural tear and CSF leakage and possible need for further surgery, the risk of failure of the arthrodesis and possibly for further surgery, the risk of anesthetic complications including myocardial infarction, stroke, pneumonia, and death. We discussed the need for postoperative immobilization in a lumbar brace. Understanding all this the patient does wish to proceed with surgery and is admitted for such.  Hosie Spangle, MD 08/01/2018 8:00 AM

## 2018-08-01 NOTE — Anesthesia Postprocedure Evaluation (Signed)
Anesthesia Post Note  Patient: Ruben Rodriguez  Procedure(s) Performed: LUMBAR THREE- LUMBAR FOUR, LUMBAR FOUR-LUMBAR FIVE DECOMPRESSION, LUMBAR THREE-LUMBAR FOUR, LUMBAR FOUR-LUMBAR FIVE POSTEROLATERAL ARTHRODESIS (N/A Spine Lumbar)     Patient location during evaluation: PACU Anesthesia Type: General Level of consciousness: awake Pain management: pain level controlled Vital Signs Assessment: post-procedure vital signs reviewed and stable Respiratory status: spontaneous breathing Cardiovascular status: stable Postop Assessment: no apparent nausea or vomiting Anesthetic complications: no    Last Vitals:  Vitals:   08/01/18 1502 08/01/18 1525  BP: 100/66 122/78  Pulse: 64 65  Resp: 14 18  Temp:  36.5 C  SpO2: 95% 99%    Last Pain:  Vitals:   08/01/18 1645  TempSrc:   PainSc: 2                  Diyan Dave

## 2018-08-02 ENCOUNTER — Encounter (HOSPITAL_COMMUNITY): Payer: Self-pay | Admitting: Neurosurgery

## 2018-08-02 MED ORDER — CYCLOBENZAPRINE HCL 10 MG PO TABS: 10.0000 mg | ORAL_TABLET | Freq: Three times a day (TID) | ORAL | Status: DC | PRN

## 2018-08-02 MED ORDER — HYDROCODONE-ACETAMINOPHEN 5-325 MG PO TABS: 1.0000 | ORAL_TABLET | ORAL | 0 refills | Status: DC | PRN

## 2018-08-02 MED ORDER — CYCLOBENZAPRINE HCL 10 MG PO TABS: 5.0000 mg | ORAL_TABLET | Freq: Three times a day (TID) | ORAL | 0 refills | Status: DC | PRN

## 2018-08-02 MED ORDER — CYCLOBENZAPRINE HCL 5 MG PO TABS: 5.0000 mg | ORAL_TABLET | Freq: Three times a day (TID) | ORAL | Status: DC | PRN

## 2018-08-02 MED FILL — Thrombin (Recombinant) For Soln 20000 Unit: CUTANEOUS | Qty: 1 | Status: AC

## 2018-08-02 NOTE — Discharge Instructions (Signed)
Wound Care Leave incision open to air. You may shower. Do not scrub directly on incision.  Do not put any creams, lotions, or ointments on incision. Activity Walk each and every day, increasing distance each day. No lifting greater than 5 lbs.  Avoid bending, arching, and twisting. No driving for 2 weeks; may ride as a passenger locally. If provided with back brace, wear when out of bed.  It is not necessary to wear in bed. Diet Resume your normal diet.  Return to Work Will be discussed at you follow up appointment. Call Your Doctor If Any of These Occur Redness, drainage, or swelling at the wound.  Temperature greater than 101 degrees. Severe pain not relieved by pain medication. Incision starts to come apart. Follow Up Appt Call today for appointment in 3 weeks ([phone]) or for problems.  If you have any hardware placed in your spine, you will need an x-ray before your appointment.    Spinal Fusion, Care After These instructions give you information about caring for yourself after your procedure. Your doctor may also give you more specific instructions. Call your doctor if you have any problems or questions after your procedure. Follow these instructions at home: Medicines  Take over-the-counter and prescription medicines only as told by your doctor. These include any medicines for pain.  Do not drive for 24 hours if you received a sedative.  Do not drive or use heavy machinery while taking prescription pain medicine.  If you were prescribed an antibiotic medicine, take it as told by your doctor. Do not stop taking the antibiotic even if you start to feel better. Surgical Cut (Incision) Care  Follow instructions from your doctor about how to take care of your surgical cut. Make sure you: ? Wash your hands with soap and water before you change your bandage (dressing). If you cannot use soap and water, use hand sanitizer. ? Change your bandage as told by your doctor. ? Leave  stitches (sutures), skin glue, or skin tape (adhesive) strips in place. They may need to stay in place for 2 weeks or longer. If tape strips get loose and curl up, you may trim the loose edges. Do not remove tape strips completely unless your doctor says it is okay.  Keep your surgical cut clean and dry. Do not take baths, swim, or use a hot tub until your doctor says it is okay.  Check your surgical cut and the area around it every day for: ? Redness. ? Swelling. ? Fluid. Physical Activity  Return to your normal activities as told by your doctor. Ask your doctor what activities are safe for you. Rest and protect your back as much as you can.  Follow instructions from your doctor about how to move. Use good posture to help your spine heal.  Do not lift anything that is heavier than 8 lb (3.6 kg) or as told by your doctor until he or she says that it is safe. Do not lift anything over your head.  Do not twist or bend at the waist until your doctor says it is okay.  Avoid pushing or pulling motions.  Do not sit or lie down in the same position for long periods of time.  Do not start to exercise until your doctor says it is okay. Ask your doctor what kinds of exercise you can do to make your back stronger. General instructions  If you were given a brace, use it as told by your doctor.  Wear  compression stockings as told by your doctor.  Do not use tobacco products. These include cigarettes, chewing tobacco, or e-cigarettes. If you need help quitting, ask your doctor.  Keep all follow-up visits as told by your doctor. This is important. This includes any visits with your physical therapist, if this applies. Contact a doctor if:  Your pain gets worse.  Your medicine does not help your pain.  Your legs or feet become painful or swollen.  Your surgical cut is red, swollen, or painful.  You have fluid, blood, or pus coming from your surgical cut.  You feel sick to your stomach  (nauseous).  You throw up (vomit).  Your have weakness or loss of feeling (numbness) in your legs that is new or getting worse.  You have a fever.  You have trouble controlling when you pee (urinate) or poop (have a bowel movement). Get help right away if:  Your pain is very bad.  You have chest pain.  You have trouble breathing.  You start to have a cough. These symptoms may be an emergency. Do not wait to see if the symptoms will go away. Get medical help right away. Call your local emergency services (911 in the [place] Do not drive yourself to the hospital. This information is not intended to replace advice given to you by your health care provider. Make sure you discuss any questions you have with your health care provider. Document Released: 01/06/2011 Document Revised: 05/10/2016 Document Reviewed: 02/25/2015 Elsevier Interactive Patient Education  [employer].

## 2018-08-02 NOTE — Progress Notes (Signed)
Pt doing well. Pt and wife given D/C instructions with Rx's, verbal understanding was provided. Pt's incision is open to air with no sign of infection. Pt's IV was removed prior to D/C. Pt D/C'd home via wheelchair @ 0945 per MD order. Pt is stable @ D/C and has no other needs at this time. Holli Humbles, RN

## 2018-08-02 NOTE — Discharge Summary (Signed)
Physician Discharge Summary  Patient ID: Ruben Rodriguez MRN: [id number] DOB/AGE: 1946-02-12 72 y.o.  Admit date: 08/01/2018 Discharge date: 08/02/2018  Admission Diagnoses:  Multilevel, multifactorial lumbar stenosis; lumbar spondylosis  Discharge Diagnoses:  Multilevel, multifactorial lumbar stenosis; lumbar spondylosis Active Problems:   Lumbar stenosis with neurogenic claudication   Discharged Condition: good  Hospital Course: Patient was admitted, underwent to L3, L4, and L5 decompressive lumbar laminectomy and a L3-L5 posterior lateral arthrodesis.  Postoperatively he has done well.  He has had excellent relief of his neurogenic claudication.  He does have moderate incisional discomfort and some spasm, but has been up and ambulating actively in the halls.  His dressing was removed, and his incision is healing nicely.  He is voiding well since the Foley catheter was removed.  He is being discharged home with instructions regarding wound care and activities.  He is scheduled for follow-up with me in about 3 weeks.  Discharge Exam: Blood pressure 126/78, pulse 70, temperature 98.3 F (36.8 C), temperature source Oral, resp. rate 20, height 5\' 9"  (1.753 m), weight 97.5 kg, SpO2 98 %.  Disposition: Discharge disposition: 01-Home or Self Care       Discharge Instructions    Discharge wound care:   Complete by:  As directed    Leave the wound open to air. Shower daily with the wound uncovered. Water and soapy water should run over the incision area. Do not wash directly on the incision for 2 weeks. Remove the glue after 2 weeks.   Driving Restrictions   Complete by:  As directed    No driving for 2 weeks. May ride in the car locally now. May begin to drive locally in 2 weeks.   Other Restrictions   Complete by:  As directed    Walk gradually increasing distances out in the fresh air at least twice a day. Walking additional 6 times inside the house, gradually increasing distances,  daily. No bending, lifting, or twisting. Perform activities between shoulder and waist height (that is at counter height when standing or table height when sitting).     Allergies as of 08/02/2018      Reactions   Lipitor [atorvastatin] Other (See Comments)   MUSCLE ACHES   Zetia [ezetimibe] Other (See Comments)   ACHES   Zocor [simvastatin] Other (See Comments)   ACHES   Other Rash   FISH   Penicillins Rash      Medication List    TAKE these medications   amLODipine 10 MG tablet Commonly known as:  NORVASC Take 10 mg by mouth daily.   aspirin 81 MG tablet Take 81 mg by mouth daily.   bisoprolol-hydrochlorothiazide 10-6.25 MG tablet Commonly known as:  ZIAC Take 1 tablet by mouth daily.   cyclobenzaprine 10 MG tablet Commonly known as:  FLEXERIL Take 0.5-1 tablets (5-10 mg total) by mouth 3 (three) times daily as needed for muscle spasms.   diclofenac 75 MG EC tablet Commonly known as:  VOLTAREN Take 75 mg by mouth 2 (two) times daily.   finasteride 5 MG tablet Commonly known as:  PROSCAR Take 5 mg by mouth daily.   fluocinonide cream 0.05 % Commonly known as:  LIDEX Apply 1 application topically daily as needed (itchy ears).   HYDROcodone-acetaminophen 5-325 MG tablet Commonly known as:  NORCO/VICODIN Take 1-2 tablets by mouth every 4 (four) hours as needed (pain).   ramipril 10 MG capsule Commonly known as:  ALTACE Take 10 mg by mouth daily.  traMADol 50 MG tablet Commonly known as:  ULTRAM Take 50 mg by mouth every 6 (six) hours as needed for moderate pain.            Discharge Care Instructions  (From admission, onward)         Start     Ordered   08/02/18 0000  Discharge wound care:    Comments:  Leave the wound open to air. Shower daily with the wound uncovered. Water and soapy water should run over the incision area. Do not wash directly on the incision for 2 weeks. Remove the glue after 2 weeks.   08/02/18 0724            Signed: Hosie Spangle 08/02/2018, 7:24 AM

## 2018-08-03 MED FILL — Sodium Chloride IV Soln 0.9%: INTRAVENOUS | Qty: 2000 | Status: AC

## 2018-08-03 MED FILL — Heparin Sodium (Porcine) Inj 1000 Unit/ML: INTRAMUSCULAR | Qty: 30 | Status: AC

## 2018-08-28 DIAGNOSIS — Z981 Arthrodesis status: Secondary | ICD-10-CM | POA: Diagnosis not present

## 2018-08-28 DIAGNOSIS — M48062 Spinal stenosis, lumbar region with neurogenic claudication: Secondary | ICD-10-CM | POA: Diagnosis not present

## 2018-10-22 DIAGNOSIS — Z0000 Encounter for general adult medical examination without abnormal findings: Secondary | ICD-10-CM | POA: Diagnosis not present

## 2018-10-22 DIAGNOSIS — E782 Mixed hyperlipidemia: Secondary | ICD-10-CM | POA: Diagnosis not present

## 2018-10-22 DIAGNOSIS — I10 Essential (primary) hypertension: Secondary | ICD-10-CM | POA: Diagnosis not present

## 2018-10-22 DIAGNOSIS — N400 Enlarged prostate without lower urinary tract symptoms: Secondary | ICD-10-CM | POA: Diagnosis not present

## 2018-10-22 DIAGNOSIS — Z1211 Encounter for screening for malignant neoplasm of colon: Secondary | ICD-10-CM | POA: Diagnosis not present

## 2018-10-22 DIAGNOSIS — M4800 Spinal stenosis, site unspecified: Secondary | ICD-10-CM | POA: Diagnosis not present

## 2018-10-22 DIAGNOSIS — Z125 Encounter for screening for malignant neoplasm of prostate: Secondary | ICD-10-CM | POA: Diagnosis not present

## 2018-10-22 DIAGNOSIS — R7301 Impaired fasting glucose: Secondary | ICD-10-CM | POA: Diagnosis not present

## 2018-10-22 DIAGNOSIS — D692 Other nonthrombocytopenic purpura: Secondary | ICD-10-CM | POA: Diagnosis not present

## 2018-10-22 DIAGNOSIS — R69 Illness, unspecified: Secondary | ICD-10-CM | POA: Diagnosis not present

## 2018-10-22 DIAGNOSIS — L309 Dermatitis, unspecified: Secondary | ICD-10-CM | POA: Diagnosis not present

## 2018-10-25 DIAGNOSIS — Z981 Arthrodesis status: Secondary | ICD-10-CM | POA: Diagnosis not present

## 2018-10-25 DIAGNOSIS — M47816 Spondylosis without myelopathy or radiculopathy, lumbar region: Secondary | ICD-10-CM | POA: Diagnosis not present

## 2018-10-25 DIAGNOSIS — M5136 Other intervertebral disc degeneration, lumbar region: Secondary | ICD-10-CM | POA: Diagnosis not present

## 2018-10-26 ENCOUNTER — Ambulatory Visit: Payer: Medicare HMO | Attending: Neurosurgery | Admitting: Physical Therapy

## 2018-10-26 ENCOUNTER — Encounter: Payer: Self-pay | Admitting: Physical Therapy

## 2018-10-26 ENCOUNTER — Other Ambulatory Visit: Payer: Self-pay

## 2018-10-26 DIAGNOSIS — R293 Abnormal posture: Secondary | ICD-10-CM | POA: Diagnosis not present

## 2018-10-26 DIAGNOSIS — M25652 Stiffness of left hip, not elsewhere classified: Secondary | ICD-10-CM

## 2018-10-26 DIAGNOSIS — M6281 Muscle weakness (generalized): Secondary | ICD-10-CM

## 2018-10-26 NOTE — Patient Instructions (Signed)
Access Code: [email or web address]  URL: [email or web address]  Date: 10/26/2018  Prepared by: Sherol Dade   Exercises  Supine March - 10 reps - 2 sets - 2x daily - 7x weekly  Supine Figure 4 Piriformis Stretch - 5 reps - 30 hold - 2x daily - 7x weekly  Seated Hip Abduction - 15 reps - 2 sets - 2x daily - 7x weekly  Hip Flexion Stretch - 10 reps - 10 hold - 2x daily - 7x weekly    [facility] [address], [place], [place] [address] Phone # [phone] Fax [phone]

## 2018-10-26 NOTE — Therapy (Signed)
[facility] [address] [place], [place], [address] Phone: [phone]   Fax:  [phone]  Physical Therapy Evaluation  Patient Details  Name: Ruben Rodriguez MRN: [id number] Date of Birth: October 03, 1945 Referring Provider (PT): Jovita Gamma, MD    Encounter Date: 10/26/2018  PT End of Session - 10/26/18 0842    Visit Number  1    Date for PT Re-Evaluation  11/26/18    Authorization Type  [employer] Medicare     Authorization Time Period  10/26/18 to 11/26/18    Authorization - Visit Number  1    Authorization - Number of Visits  10    PT Start Time  0800    PT Stop Time  0841    PT Time Calculation (min)  41 min    Activity Tolerance  No increased pain;Patient tolerated treatment well    Behavior During Therapy  [facility] for tasks assessed/performed       Past Medical History:  Diagnosis Date  . Arthritis   . BPH (benign prostatic hyperplasia)   . Chronic back pain   . Family history of adverse reaction to anesthesia    father died during carotid artery surgery  . GERD (gastroesophageal reflux disease)   . Hyperlipidemia   . Hypertension   . Pneumonia   . Spinal stenosis     Past Surgical History:  Procedure Laterality Date  . COLONOSCOPY    . FINGER SURGERY     to remove a tumor from right little finger  . LAMINECTOMY WITH POSTERIOR LATERAL ARTHRODESIS LEVEL 2 N/A 08/01/2018   Procedure: LUMBAR THREE- LUMBAR FOUR, LUMBAR FOUR-LUMBAR FIVE DECOMPRESSION, LUMBAR THREE-LUMBAR FOUR, LUMBAR FOUR-LUMBAR FIVE POSTEROLATERAL ARTHRODESIS;  Surgeon: Jovita Gamma, MD;  Location: [place];  Service: Neurosurgery;  Laterality: N/A;    There were no vitals filed for this visit.   Subjective Assessment - 10/26/18 0803    Subjective  Pt had back surgery on 08/01/18. Since then, he has been doing very well. He still has some soreness/stiffness which he would like to address.     Pertinent History  Lumbar surgery on november  2019    Patient Stated Goals  improve flexibility    Currently in Pain?  No/denies         [facility] PT Assessment - 10/26/18 0001      Assessment   Medical Diagnosis  Arthrodesis status    Referring Provider (PT)  Jovita Gamma, MD     Onset Date/Surgical Date  08/01/18    Next MD Visit  in a couple of months     Prior Therapy  none       Precautions   Precautions  Back      Balance Screen   Has the patient fallen in the past 6 months  No    Has the patient had a decrease in activity level because of a fear of falling?   No    Is the patient reluctant to leave their home because of a fear of falling?   No      Prior Function   Vocation Requirements  [place]       Cognition   Overall Cognitive Status  Within Functional Limits for tasks assessed      Posture/Postural Control   Posture Comments  Pt standing with Lt hip held in slight flexion; flexed posture      ROM / Strength  AROM / PROM / Strength  Strength;PROM;AROM      AROM   Overall AROM Comments  thoracic rotation limited to 20 deg Rt and 45 deg Lt       PROM   Overall PROM Comments  Lt hip flexion 110 deg, IR 0 deg, extension lacking 5 deg from neutral      Strength   Overall Strength Comments  Lt hip abduction 3/5 MMT, extension 3/5 MMT, hip flexion 4/5 MMT      Flexibility   Soft Tissue Assessment /Muscle Length  yes    Hamstrings  BLE 90/90 lacking 35 deg       Palpation   Palpation comment  tenderness along Lt glute max and med; Pt tender over Lt greater trochanter                Objective measurements completed on examination: See above findings.      [facility] Adult PT Treatment/Exercise - 10/26/18 0001      Exercises   Exercises  Knee/Hip      Knee/Hip Exercises: Stretches   Piriformis Stretch  Both;1 rep;20 seconds    Piriformis Stretch Limitations  supine     Other Knee/Hip Stretches  single knee to chest x5 reps each       Knee/Hip  Exercises: Seated   Clamshell with TheraBand  Green   x10 reps each      Knee/Hip Exercises: Supine   Other Supine Knee/Hip Exercises  abdominal brace with alternating hip flexion x5 reps each       Manual Therapy   Manual Therapy  Joint mobilization    Joint Mobilization  Lt hip long axis distraction grade III-IV x3 bouts              PT Education - 10/26/18 1016    Education Details  eval findings/POC; implemented and reviewed HEP    Person(s) Educated  Patient    Methods  Explanation;Verbal cues;Tactile cues;Handout    Comprehension  Returned demonstration;Verbalized understanding       PT Short Term Goals - 10/26/18 0919      PT SHORT TERM GOAL #1   Title  Pt will demo consistency and independence with his initial HEP to decrease pain and improve hip flexibility.     Time  2    Period  Weeks    Status  New    Target Date  11/09/18        PT Long Term Goals - 10/26/18 0920      PT LONG TERM GOAL #1   Title  Pt will have increase in Lt hip flexion to atleast 120 deg and extension to atleast 0 deg which will assist with sit to stand posture.     Time  4    Period  Weeks    Status  New    Target Date  11/26/18      PT LONG TERM GOAL #2   Title  Pt will have atleast 4/5 MMT strength of the Lt hip which will improve his efficency with daily activity.     Time  4    Period  Weeks    Status  New      PT LONG TERM GOAL #3   Title  Pt will report atleast 40% improvement in his pain and flexibility from the start of PT which will allow him to return to other extra-curricular activity.     Time  4    Period  Weeks  Status  New      PT LONG TERM GOAL #4   Title  Pt will demo understanding of his advanced HEP to allow for progression to a gym program for further strength and flexibility gains.     Time  4    Period  Weeks    Status  New             Plan - 10/26/18 0845    Clinical Impression Statement  Pt is a pleasant 73 y.o M referred to OPPT with  complaints of stiffness and intermittent soreness following lumbar surgery back in November. He demonstrates limitations in Lt hip flexibility and joint mobility as well as residual weakness of the hip and trunk musculature. He is unable to attain Lt hip extension to neutral in both supine and standing positions due to increase in pain. This limits his ability to maintain upright posture during ambulation and other standing tasks. He would like to return to the gym and improve his overall strength and flexibility for safe return to his other extracurricular activities. He would benefit from skilled PT to decrease pain with activity, and promote increase in strength and flexibility for transition to a gym program.     History and Personal Factors relevant to plan of care:  lumbar surgery in november 2019    Clinical Presentation  Stable    Clinical Decision Making  Low    Rehab Potential  Good    PT Frequency  2x / week    PT Duration  4 weeks    PT Treatment/Interventions  ADLs/Self Care Home Management;Moist Heat;Stair training;Gait training;Functional mobility training;Therapeutic activities;Neuromuscular re-education;Therapeutic exercise;Patient/family education;Balance training;Manual techniques;Passive range of motion;Dry needling       Patient will benefit from skilled therapeutic intervention in order to improve the following deficits and impairments:  Abnormal gait, Decreased activity tolerance, Decreased range of motion, Decreased endurance, Difficulty walking, Hypomobility, Increased muscle spasms, Improper body mechanics, Impaired flexibility, Postural dysfunction, Pain  Visit Diagnosis: Stiffness of left hip, not elsewhere classified - Plan: PT plan of care cert/re-cert  Muscle weakness (generalized) - Plan: PT plan of care cert/re-cert  Abnormal posture - Plan: PT plan of care cert/re-cert     Problem List Patient Active Problem List   Diagnosis Date Noted  . Lumbar stenosis  with neurogenic claudication 08/01/2018   10:26 AM,10/26/18 Sherol Dade PT, DPT [facility] [address], [place], [place], [address] Phone: [phone]   Fax:  [phone]  Name: Ruben Rodriguez MRN: [id number] Date of Birth: 05/31/1946

## 2018-10-29 DIAGNOSIS — R69 Illness, unspecified: Secondary | ICD-10-CM | POA: Diagnosis not present

## 2018-11-01 ENCOUNTER — Ambulatory Visit: Payer: Medicare HMO | Attending: Neurosurgery

## 2018-11-01 DIAGNOSIS — M25652 Stiffness of left hip, not elsewhere classified: Secondary | ICD-10-CM | POA: Insufficient documentation

## 2018-11-01 DIAGNOSIS — R293 Abnormal posture: Secondary | ICD-10-CM | POA: Insufficient documentation

## 2018-11-01 DIAGNOSIS — M6281 Muscle weakness (generalized): Secondary | ICD-10-CM

## 2018-11-01 NOTE — Patient Instructions (Signed)
Access Code: [email or web address]  URL: [email or web address]  Date: 11/01/2018  Prepared by: Sigurd Sos   Supine Butterfly Groin Stretch - 3 reps - 20 hold - 3x daily - 7x weekly

## 2018-11-01 NOTE — Therapy (Signed)
[facility] [address] [place], [place], [address] Phone: [phone]   Fax:  [phone]  Physical Therapy Treatment  Patient Details  Name: Ruben Rodriguez MRN: [id number] Date of Birth: [birth year] Referring Provider (PT): Jovita Gamma, MD    Encounter Date: 11/01/2018  PT End of Session - 11/01/18 1101    Visit Number  2    Date for PT Re-Evaluation  11/26/18    Authorization Type  [employer] Medicare     PT Start Time  1017    PT Stop Time  1059    PT Time Calculation (min)  42 min    Activity Tolerance  No increased pain;Patient tolerated treatment well    Behavior During Therapy  [facility] for tasks assessed/performed       Past Medical History:  Diagnosis Date  . Arthritis   . BPH (benign prostatic hyperplasia)   . Chronic back pain   . Family history of adverse reaction to anesthesia    father died during carotid artery surgery  . GERD (gastroesophageal reflux disease)   . Hyperlipidemia   . Hypertension   . Pneumonia   . Spinal stenosis     Past Surgical History:  Procedure Laterality Date  . COLONOSCOPY    . FINGER SURGERY     to remove a tumor from right little finger  . LAMINECTOMY WITH POSTERIOR LATERAL ARTHRODESIS LEVEL 2 N/A 08/01/2018   Procedure: LUMBAR THREE- LUMBAR FOUR, LUMBAR FOUR-LUMBAR FIVE DECOMPRESSION, LUMBAR THREE-LUMBAR FOUR, LUMBAR FOUR-LUMBAR FIVE POSTEROLATERAL ARTHRODESIS;  Surgeon: Jovita Gamma, MD;  Location: [facility];  Service: Neurosurgery;  Laterality: N/A;    There were no vitals filed for this visit.  Subjective Assessment - 11/01/18 1020    Subjective  I am doing well with my exercises.      Patient Stated Goals  improve flexibility    Currently in Pain?  No/denies                       [facility] Adult PT Treatment/Exercise - 11/01/18 0001      Knee/Hip Exercises: Stretches   Active Hamstring Stretch  Both;20 seconds;3 reps    Piriformis Stretch   Both;20 seconds;3 reps    Piriformis Stretch Limitations  supine     Other Knee/Hip Stretches  single knee to chest x5 reps each       Knee/Hip Exercises: Aerobic   Nustep  Level 3x 8 minutes   PT present to discuss progress     Knee/Hip Exercises: Standing   Hip Abduction  Stengthening;Both;2 sets;10 reps    Hip Extension  Stengthening;Both;2 sets;10 reps      Knee/Hip Exercises: Seated   Clamshell with TheraBand  Blue   x10 reps each      Knee/Hip Exercises: Supine   Other Supine Knee/Hip Exercises  abdominal brace with alternating hip flexion x5 reps each     Other Supine Knee/Hip Exercises  butterfly stretch 5x 20 seconds             PT Education - 11/01/18 1041    Education Details   Access Code: [email or web address]     Person(s) Educated  Patient    Methods  Explanation;Demonstration;Handout    Comprehension  Returned demonstration;Verbalized understanding       PT Short Term Goals - 10/26/18 0919      PT SHORT TERM GOAL #1   Title  Pt will demo consistency and independence with his initial HEP  to decrease pain and improve hip flexibility.     Time  2    Period  Weeks    Status  New    Target Date  11/09/18        PT Long Term Goals - 10/26/18 0920      PT LONG TERM GOAL #1   Title  Pt will have increase in Lt hip flexion to atleast 120 deg and extension to atleast 0 deg which will assist with sit to stand posture.     Time  4    Period  Weeks    Status  New    Target Date  11/26/18      PT LONG TERM GOAL #2   Title  Pt will have atleast 4/5 MMT strength of the Lt hip which will improve his efficency with daily activity.     Time  4    Period  Weeks    Status  New      PT LONG TERM GOAL #3   Title  Pt will report atleast 40% improvement in his pain and flexibility from the start of PT which will allow him to return to other extra-curricular activity.     Time  4    Period  Weeks    Status  New      PT LONG TERM GOAL #4   Title  Pt will demo  understanding of his advanced HEP to allow for progression to a gym program for further strength and flexibility gains.     Time  4    Period  Weeks    Status  New            Plan - 11/01/18 1038    Clinical Impression Statement  Pt with first time follow-up after evaluation.  Pt required verbal and demo cues to reduce speed of movement with exercise and to reduce substitution with flexibility exercises.  Pt required verbal cueing for abdominal bracing and erect posture with standing exercises.  Pt will continue to benefit from skilled PT for hip strength, endurance, and hip flexibility.      Rehab Potential  Good    PT Frequency  2x / week    PT Duration  4 weeks    PT Treatment/Interventions  ADLs/Self Care Home Management;Moist Heat;Stair training;Gait training;Functional mobility training;Therapeutic activities;Neuromuscular re-education;Therapeutic exercise;Patient/family education;Balance training;Manual techniques;Passive range of motion;Dry needling    PT Next Visit Plan  continue hip flexibility, manual for hip flexibility, core strenth, erect posture, endurance    PT Home Exercise Plan  Access Code: [email or web address]    Consulted and Agree with Plan of Care  Patient       Patient will benefit from skilled therapeutic intervention in order to improve the following deficits and impairments:  Abnormal gait, Decreased activity tolerance, Decreased range of motion, Decreased endurance, Difficulty walking, Hypomobility, Increased muscle spasms, Improper body mechanics, Impaired flexibility, Postural dysfunction, Pain  Visit Diagnosis: Stiffness of left hip, not elsewhere classified  Muscle weakness (generalized)  Abnormal posture     Problem List Patient Active Problem List   Diagnosis Date Noted  . 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   Sigurd Sos, PT 11/01/18 11:02 AM  [facility] [address],  [place], [place], [address] Phone: [phone]   Fax:  [phone]  Name: Ruben Rodriguez MRN: [id number] Date of Birth: [birth year]

## 2018-11-05 ENCOUNTER — Ambulatory Visit: Payer: Medicare HMO | Admitting: Physical Therapy

## 2018-11-05 ENCOUNTER — Encounter: Payer: Self-pay | Admitting: Physical Therapy

## 2018-11-05 DIAGNOSIS — R293 Abnormal posture: Secondary | ICD-10-CM | POA: Diagnosis not present

## 2018-11-05 DIAGNOSIS — M6281 Muscle weakness (generalized): Secondary | ICD-10-CM

## 2018-11-05 DIAGNOSIS — M25652 Stiffness of left hip, not elsewhere classified: Secondary | ICD-10-CM

## 2018-11-05 NOTE — Therapy (Signed)
[facility] [address], [place], [place], [address] Phone: [phone]   Fax:  [phone]  Physical Therapy Treatment  Patient Details  Name: Ruben Rodriguez MRN: [id number] Date of Birth: November 21, 1945 Referring Provider (PT): Jovita Gamma, MD    Encounter Date: 11/05/2018  PT End of Session - 11/05/18 1150    Visit Number  3    Date for PT Re-Evaluation  11/26/18    Authorization Type  [employer]ET[employer] Medicare     Authorization Time Period  10/26/18 to 11/26/18    Authorization - Visit Number  3    Authorization - Number of Visits  10    PT Start Time  1146    PT Stop Time  1230    PT Time Calculation (min)  44 min    Activity Tolerance  No increased pain;Patient tolerated treatment well    Behavior During Therapy  [facility] for tasks assessed/performed       Past Medical History:  Diagnosis Date  . Arthritis   . BPH (benign prostatic hyperplasia)   . Chronic back pain   . Family history of adverse reaction to anesthesia    father died during carotid artery surgery  . GERD (gastroesophageal reflux disease)   . Hyperlipidemia   . Hypertension   . Pneumonia   . Spinal stenosis     Past Surgical History:  Procedure Laterality Date  . COLONOSCOPY    . FINGER SURGERY     to remove a tumor from right little finger  . LAMINECTOMY WITH POSTERIOR LATERAL ARTHRODESIS LEVEL 2 N/A 08/01/2018   Procedure: LUMBAR THREE- LUMBAR FOUR, LUMBAR FOUR-LUMBAR FIVE DECOMPRESSION, LUMBAR THREE-LUMBAR FOUR, LUMBAR FOUR-LUMBAR FIVE POSTEROLATERAL ARTHRODESIS;  Surgeon: Jovita Gamma, MD;  Location: [place];  Service: Neurosurgery;  Laterality: N/A;    There were no vitals filed for this visit.  Subjective Assessment - 11/05/18 1152    Subjective  I feel much better.    Currently in Pain?  No/denies    Multiple Pain Sites  No                       [facility] Adult PT Treatment/Exercise - 11/05/18 0001      Ambulation/Gait   Gait Comments  Worked on lateral shift and ambulating with more upright posture. TC to lessen lateral shift. 20 feet 4x       Knee/Hip Exercises: Stretches   Active Hamstring Stretch  Both;20 seconds;3 reps    Piriformis Stretch  Both;2 reps;20 seconds    Piriformis Stretch Limitations  supine     Other Knee/Hip Stretches  Bil hip ER release : needs props to help relax hip 2 min each side      Knee/Hip Exercises: Aerobic   Recumbent Bike  L1-2 x 10 min: VC to maintain > 50 rpms and "push & pull" with this thigh muscles      Knee/Hip Exercises: Supine   Other Supine Knee/Hip Exercises  core/TA activaiton with Pilates breath 10x,     Other Supine Knee/Hip Exercises  LE MR release including hip IR/ER 10x along the calf divided up into 3 sections.    Repeat at hamstrings": using soft foam roll      Manual Therapy   Passive ROM  Hip ER 10x bil             PT Education - 11/05/18 1225    Education Details  Gait practice at home: relaxing through his  LT shoulder, standing more erect.    Person(s) Educated  Patient    Methods  Explanation;Demonstration;Tactile cues;Verbal cues    Comprehension  Returned demonstration;Verbalized understanding       PT Short Term Goals - 10/26/18 0919      PT SHORT TERM GOAL #1   Title  Pt will demo consistency and independence with his initial HEP to decrease pain and improve hip flexibility.     Time  2    Period  Weeks    Status  New    Target Date  11/09/18        PT Long Term Goals - 10/26/18 0920      PT LONG TERM GOAL #1   Title  Pt will have increase in Lt hip flexion to atleast 120 deg and extension to atleast 0 deg which will assist with sit to stand posture.     Time  4    Period  Weeks    Status  New    Target Date  11/26/18      PT LONG TERM GOAL #2   Title  Pt will have atleast 4/5 MMT strength of the Lt hip which will improve his efficency with daily activity.     Time  4    Period  Weeks    Status  New      PT LONG  TERM GOAL #3   Title  Pt will report atleast 40% improvement in his pain and flexibility from the start of PT which will allow him to return to other extra-curricular activity.     Time  4    Period  Weeks    Status  New      PT LONG TERM GOAL #4   Title  Pt will demo understanding of his advanced HEP to allow for progression to a gym program for further strength and flexibility gains.     Time  4    Period  Weeks    Status  New            Plan - 11/05/18 1159    Clinical Impression Statement  Pt arrives to therapy today without any pain, just a "feeling" in the anterior Lt thigh. Continueto work on hip ER LT > RT. Pt demonstrated a fflexed posture when standing with what appeared to be a lateral shift including LT shoulder hiking ( significant). Pt will work on this at home as aprt of his HEP.     Rehab Potential  Good    PT Frequency  2x / week    PT Duration  4 weeks    PT Treatment/Interventions  ADLs/Self Care Home Management;Moist Heat;Stair training;Gait training;Functional mobility training;Therapeutic activities;Neuromuscular re-education;Therapeutic exercise;Patient/family education;Balance training;Manual techniques;Passive range of motion;Dry needling    PT Next Visit Plan  continue hip flexibility, manual for hip flexibility, core strenth, erect posture, endurance    PT Home Exercise Plan  Access Code: [email or web address]    Consulted and Agree with Plan of Care  Patient       Patient will benefit from skilled therapeutic intervention in order to improve the following deficits and impairments:  Abnormal gait, Decreased activity tolerance, Decreased range of motion, Decreased endurance, Difficulty walking, Hypomobility, Increased muscle spasms, Improper body mechanics, Impaired flexibility, Postural dysfunction, Pain  Visit Diagnosis: Stiffness of left hip, not elsewhere classified  Muscle weakness (generalized)  Abnormal posture     Problem List Patient Active Problem  List   Diagnosis Date Noted  .  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    Inetta Dicke, PTA 11/05/2018, 12:27 PM  [facility] [address], [place], [place], [address] Phone: [phone]   Fax:  [phone]  Name: Ruben Rodriguez MRN: [id number] Date of Birth: 17-Oct-1945

## 2018-11-08 ENCOUNTER — Ambulatory Visit: Payer: Medicare HMO

## 2018-11-08 DIAGNOSIS — M25652 Stiffness of left hip, not elsewhere classified: Secondary | ICD-10-CM

## 2018-11-08 DIAGNOSIS — R293 Abnormal posture: Secondary | ICD-10-CM

## 2018-11-08 DIAGNOSIS — M6281 Muscle weakness (generalized): Secondary | ICD-10-CM

## 2018-11-08 NOTE — Therapy (Signed)
[facility] [address], [place], [place], [address] Phone: [phone]   Fax:  [phone]  Physical Therapy Treatment  Patient Details  Name: Ruben Rodriguez MRN: [id number] Date of Birth: March 06, 1946 Referring Provider (PT): Jovita Gamma, MD    Encounter Date: 11/08/2018  PT End of Session - 11/08/18 0924    Visit Number  4    Date for PT Re-Evaluation  11/26/18    Authorization Type  AETNA Medicare     PT Start Time  8756    PT Stop Time  0925    PT Time Calculation (min)  41 min    Activity Tolerance  No increased pain;Patient tolerated treatment well    Behavior During Therapy  [facility] for tasks assessed/performed       Past Medical History:  Diagnosis Date  . Arthritis   . BPH (benign prostatic hyperplasia)   . Chronic back pain   . Family history of adverse reaction to anesthesia    father died during carotid artery surgery  . GERD (gastroesophageal reflux disease)   . Hyperlipidemia   . Hypertension   . Pneumonia   . Spinal stenosis     Past Surgical History:  Procedure Laterality Date  . COLONOSCOPY    . FINGER SURGERY     to remove a tumor from right little finger  . LAMINECTOMY WITH POSTERIOR LATERAL ARTHRODESIS LEVEL 2 N/A 08/01/2018   Procedure: LUMBAR THREE- LUMBAR FOUR, LUMBAR FOUR-LUMBAR FIVE DECOMPRESSION, LUMBAR THREE-LUMBAR FOUR, LUMBAR FOUR-LUMBAR FIVE POSTEROLATERAL ARTHRODESIS;  Surgeon: Jovita Gamma, MD;  Location: [place];  Service: Neurosurgery;  Laterality: N/A;    There were no vitals filed for this visit.  Subjective Assessment - 11/08/18 0849    Subjective  I feel pretty good today.  Yesterday my Lt leg was huting a lot becasue I was working.      Pertinent History  Lumbar surgery on november 2019    Patient Stated Goals  improve flexibility    Currently in Pain?  No/denies   aching yesterday 1-2/10                      [facility] Adult PT Treatment/Exercise  - 11/08/18 0001      Knee/Hip Exercises: Stretches   Active Hamstring Stretch  Both;20 seconds;3 reps    Hip Flexor Stretch  Left;3 reps;20 seconds   edge of mat with 8" step under foot   Piriformis Stretch  Both;2 reps;20 seconds    Piriformis Stretch Limitations  seated    Other Knee/Hip Stretches  butterfly stretch 10 secx5 each      Knee/Hip Exercises: Aerobic   Nustep  Level 3x 10 minutes   PT present to discuss progress     Knee/Hip Exercises: Seated   Sit to Sand  2 sets;10 reps;without UE support   emphasis on posture and Rt=Lt     Knee/Hip Exercises: Supine   Bridges  Strengthening;2 sets;5 reps;Both      Knee/Hip Exercises: Sidelying   Clams  2x10 bil each               PT Short Term Goals - 11/08/18 0851      PT SHORT TERM GOAL #1   Title  Pt will demo consistency and independence with his initial HEP to decrease pain and improve hip flexibility.     Status  Achieved        PT Long Term Goals - 10/26/18 4332  PT LONG TERM GOAL #1   Title  Pt will have increase in Lt hip flexion to atleast 120 deg and extension to atleast 0 deg which will assist with sit to stand posture.     Time  4    Period  Weeks    Status  New    Target Date  11/26/18      PT LONG TERM GOAL #2   Title  Pt will have atleast 4/5 MMT strength of the Lt hip which will improve his efficency with daily activity.     Time  4    Period  Weeks    Status  New      PT LONG TERM GOAL #3   Title  Pt will report atleast 40% improvement in his pain and flexibility from the start of PT which will allow him to return to other extra-curricular activity.     Time  4    Period  Weeks    Status  New      PT LONG TERM GOAL #4   Title  Pt will demo understanding of his advanced HEP to allow for progression to a gym program for further strength and flexibility gains.     Time  4    Period  Weeks    Status  New            Plan - 11/08/18 0907    Clinical Impression Statement  Pt  with improved posture today and has been working at home to correct his pelvic shift.  Pt with weak core and hips and exercise is focused on improving this.  Pt with significant bil. Hip flexor tightness is working on this with postural adjustments and flexibility to address.  Pt will continue to benefit from skilled PT s/p lumbar fusion to improve strength, flexibility and endurance.      Rehab Potential  Good    PT Frequency  2x / week    PT Duration  4 weeks    PT Treatment/Interventions  ADLs/Self Care Home Management;Moist Heat;Stair training;Gait training;Functional mobility training;Therapeutic activities;Neuromuscular re-education;Therapeutic exercise;Patient/family education;Balance training;Manual techniques;Passive range of motion;Dry needling    PT Next Visit Plan  continue hip flexibility, manual for hip flexibility, core strenth, erect posture, endurance    PT Home Exercise Plan  Access Code: [email or web address]    Recommended Other Services  MD signed initial certification    Consulted and Agree with Plan of Care  Patient       Patient will benefit from skilled therapeutic intervention in order to improve the following deficits and impairments:  Abnormal gait, Decreased activity tolerance, Decreased range of motion, Decreased endurance, Difficulty walking, Hypomobility, Increased muscle spasms, Improper body mechanics, Impaired flexibility, Postural dysfunction, Pain  Visit Diagnosis: Stiffness of left hip, not elsewhere classified  Muscle weakness (generalized)  Abnormal posture     Problem List Patient Active Problem List   Diagnosis Date Noted  . 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    Ruben Rodriguez, PT 11/08/18 9:27 AM  [facility] [address], [place], [place], [address] Phone: [phone]   Fax:  [phone]  Name: Ruben Rodriguez MRN: [id number] Date of Birth: May 11, 1946

## 2018-11-09 DIAGNOSIS — N179 Acute kidney failure, unspecified: Secondary | ICD-10-CM | POA: Diagnosis not present

## 2018-11-12 ENCOUNTER — Ambulatory Visit: Payer: Medicare HMO

## 2018-11-12 DIAGNOSIS — M25652 Stiffness of left hip, not elsewhere classified: Secondary | ICD-10-CM

## 2018-11-12 DIAGNOSIS — M6281 Muscle weakness (generalized): Secondary | ICD-10-CM

## 2018-11-12 DIAGNOSIS — R293 Abnormal posture: Secondary | ICD-10-CM | POA: Diagnosis not present

## 2018-11-12 NOTE — Therapy (Signed)
[facility] [address] [place], [place], [address] Phone: [phone]   Fax:  [phone]  Physical Therapy Treatment  Patient Details  Name: Ruben Rodriguez MRN: [id number] Date of Birth: Jan 26, 1946 Referring Provider (PT): Jovita Gamma, MD    Encounter Date: 11/12/2018  PT End of Session - 11/12/18 0928    Visit Number  5    Date for PT Re-Evaluation  11/26/18    Authorization Type  AETNA Medicare     PT Start Time  6948    PT Stop Time  0928    PT Time Calculation (min)  41 min    Activity Tolerance  No increased pain;Patient tolerated treatment well    Behavior During Therapy  [facility] for tasks assessed/performed       Past Medical History:  Diagnosis Date  . Arthritis   . BPH (benign prostatic hyperplasia)   . Chronic back pain   . Family history of adverse reaction to anesthesia    father died during carotid artery surgery  . GERD (gastroesophageal reflux disease)   . Hyperlipidemia   . Hypertension   . Pneumonia   . Spinal stenosis     Past Surgical History:  Procedure Laterality Date  . COLONOSCOPY    . FINGER SURGERY     to remove a tumor from right little finger  . LAMINECTOMY WITH POSTERIOR LATERAL ARTHRODESIS LEVEL 2 N/A 08/01/2018   Procedure: LUMBAR THREE- LUMBAR FOUR, LUMBAR FOUR-LUMBAR FIVE DECOMPRESSION, LUMBAR THREE-LUMBAR FOUR, LUMBAR FOUR-LUMBAR FIVE POSTEROLATERAL ARTHRODESIS;  Surgeon: Jovita Gamma, MD;  Location: [place];  Service: Neurosurgery;  Laterality: N/A;    There were no vitals filed for this visit.  Subjective Assessment - 11/12/18 0850    Subjective  I had a bad day on Saturday.  I think it was because of the cold.  Pain was up to 4/10 in the center of my low back.      Pertinent History  Lumbar surgery on november 2019    Patient Stated Goals  improve flexibility    Currently in Pain?  No/denies         [facility] PT Assessment - 11/12/18 0001      ROM / Strength    AROM / PROM / Strength  Strength      Strength   Overall Strength  Deficits    Strength Assessment Site  Hip    Right/Left Hip  Left    Left Hip Flexion  4/5    Left Hip Extension  3/5    Left Hip ABduction  3+/5                   [facility] Adult PT Treatment/Exercise - 11/12/18 0001      Knee/Hip Exercises: Stretches   Active Hamstring Stretch  Both;20 seconds;3 reps    Hip Flexor Stretch  Left;3 reps;20 seconds   leg straight on mat with concurrent Addaday   Piriformis Stretch  Both;2 reps;20 seconds    Piriformis Stretch Limitations  seated    Other Knee/Hip Stretches  butterfly stretch 10 secx5 each      Knee/Hip Exercises: Aerobic   Nustep  Level 3x 10 minutes   PT present to discuss progress     Knee/Hip Exercises: Seated   Sit to Sand  2 sets;10 reps;without UE support   emphasis on posture and Rt=Lt     Knee/Hip Exercises: Supine   Bridges  Strengthening;2 sets;5 reps;Both    Straight  Leg Raises  Strengthening;Both;2 sets;5 sets      Knee/Hip Exercises: Sidelying   Clams  2x10 bil each      Manual Therapy   Manual Therapy  Soft tissue mobilization    Manual therapy comments  Addaday with red attachment to Lt hip flexor and lateral quad               PT Short Term Goals - 11/12/18 0855      PT SHORT TERM GOAL #1   Title  Pt will demo consistency and independence with his initial HEP to decrease pain and improve hip flexibility.     Status  Achieved        PT Long Term Goals - 11/12/18 0856      PT LONG TERM GOAL #3   Title  Pt will report atleast 40% improvement in his pain and flexibility from the start of PT which will allow him to return to other extra-curricular activity.     Baseline  20%     Time  4    Period  Weeks    Status  On-going            Plan - 11/12/18 0919    Clinical Impression Statement  Pt is making steady progress after lumbar surgery.  Pt experienced temporary central lumbar pain up to 4/10 over the  weekend.  PT is focused on core and hip strength and flexibility.  Pt reports 20% overall improvement in pain and flexibility since the start of care.  Lt hip strength is improved into abduction.  Pt is challenged by full knee extension with straight leg raise today and requires tactile and verbal cues.  Pt with tension and trigger points over Lt hip flexor and lateral quad.  Pt will continue to benefit from skilled PT for hip and core strength, flexibility and manual therapy for tissue mobility.      Rehab Potential  Good    PT Frequency  2x / week    PT Duration  4 weeks    PT Treatment/Interventions  ADLs/Self Care Home Management;Moist Heat;Stair training;Gait training;Functional mobility training;Therapeutic activities;Neuromuscular re-education;Therapeutic exercise;Patient/family education;Balance training;Manual techniques;Passive range of motion;Dry needling    PT Next Visit Plan  continue hip flexibility, manual for hip flexibility, core strenth, erect posture, endurance    PT Home Exercise Plan  Access Code: [email or web address]    Consulted and Agree with Plan of Care  Patient       Patient will benefit from skilled therapeutic intervention in order to improve the following deficits and impairments:  Abnormal gait, Decreased activity tolerance, Decreased range of motion, Decreased endurance, Difficulty walking, Hypomobility, Increased muscle spasms, Improper body mechanics, Impaired flexibility, Postural dysfunction, Pain  Visit Diagnosis: Stiffness of left hip, not elsewhere classified  Muscle weakness (generalized)  Abnormal posture     Problem List Patient Active Problem List   Diagnosis Date Noted  . 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    Sigurd Sos, PT 11/12/18 9:36 AM  [facility] [address] [place], [place], [address] Phone: [phone]   Fax:  [phone]  Name: Ruben Rodriguez MRN:  [id number] Date of Birth: 03/28/1946

## 2018-11-15 ENCOUNTER — Ambulatory Visit: Payer: Medicare HMO

## 2018-11-15 DIAGNOSIS — R293 Abnormal posture: Secondary | ICD-10-CM

## 2018-11-15 DIAGNOSIS — M6281 Muscle weakness (generalized): Secondary | ICD-10-CM | POA: Diagnosis not present

## 2018-11-15 DIAGNOSIS — M25652 Stiffness of left hip, not elsewhere classified: Secondary | ICD-10-CM | POA: Diagnosis not present

## 2018-11-15 NOTE — Therapy (Signed)
[facility] [address], [place], [place], [address] Phone: [phone]   Fax:  [phone]  Physical Therapy Treatment  Patient Details  Name: Ruben Rodriguez MRN: [id number] Date of Birth: [birth year] Referring Provider (PT): Jovita Gamma, MD    Encounter Date: 11/15/2018  PT End of Session - 11/15/18 0928    Visit Number  6    Date for PT Re-Evaluation  11/26/18    Authorization Type  AETNA Medicare     PT Start Time  0846    PT Stop Time  0929    PT Time Calculation (min)  43 min    Activity Tolerance  No increased pain;Patient tolerated treatment well    Behavior During Therapy  [facility] for tasks assessed/performed       Past Medical History:  Diagnosis Date  . Arthritis   . BPH (benign prostatic hyperplasia)   . Chronic back pain   . Family history of adverse reaction to anesthesia    father died during carotid artery surgery  . GERD (gastroesophageal reflux disease)   . Hyperlipidemia   . Hypertension   . Pneumonia   . Spinal stenosis     Past Surgical History:  Procedure Laterality Date  . COLONOSCOPY    . FINGER SURGERY     to remove a tumor from right little finger  . LAMINECTOMY WITH POSTERIOR LATERAL ARTHRODESIS LEVEL 2 N/A 08/01/2018   Procedure: LUMBAR THREE- LUMBAR FOUR, LUMBAR FOUR-LUMBAR FIVE DECOMPRESSION, LUMBAR THREE-LUMBAR FOUR, LUMBAR FOUR-LUMBAR FIVE POSTEROLATERAL ARTHRODESIS;  Surgeon: Jovita Gamma, MD;  Location: [place];  Service: Neurosurgery;  Laterality: N/A;    There were no vitals filed for this visit.  Subjective Assessment - 11/15/18 0901    Subjective  I am having a little more pain today because of the weather.      Currently in Pain?  Yes    Pain Score  2     Pain Location  Back    Pain Orientation  Lower    Pain Descriptors / Indicators  Aching;Throbbing    Pain Type  Surgical pain    Pain Onset  More than a month ago    Pain Frequency  Intermittent     Aggravating Factors   weather changes, random, sitting at work    Pain Relieving Factors  change of position                       [facility] Adult PT Treatment/Exercise - 11/15/18 0001      Exercises   Exercises  Lumbar      Lumbar Exercises: Stretches   Active Hamstring Stretch  Both;20 seconds;3 reps    Piriformis Stretch  Both;2 reps;20 seconds    Piriformis Stretch Limitations  seated      Lumbar Exercises: Standing   Other Standing Lumbar Exercises  extension x 5      Lumbar Exercises: Prone   Other Prone Lumbar Exercises  prone for stretch to bil hip flexors x 2 minutes      Knee/Hip Exercises: Stretches   Hip Flexor Stretch  Left;3 reps;20 seconds   leg straight on mat with concurrent Addaday   Other Knee/Hip Stretches  butterfly stretch 10 secx5 each      Knee/Hip Exercises: Aerobic   Nustep  Level 3x 10 minutes   PT present to discuss progress     Knee/Hip Exercises: Supine   Bridges  Strengthening;2 sets;5 reps;Both  Manual Therapy   Manual Therapy  Soft tissue mobilization    Manual therapy comments  Addaday with red attachment to Lt hip flexor and lateral quad               PT Short Term Goals - 11/12/18 0855      PT SHORT TERM GOAL #1   Title  Pt will demo consistency and independence with his initial HEP to decrease pain and improve hip flexibility.     Status  Achieved        PT Long Term Goals - 11/12/18 0856      PT LONG TERM GOAL #3   Title  Pt will report atleast 40% improvement in his pain and flexibility from the start of PT which will allow him to return to other extra-curricular activity.     Baseline  20%     Time  4    Period  Weeks    Status  On-going            Plan - 11/15/18 4356    Clinical Impression Statement  Pt with significant hip flexor tightness and has difficulty obtaining erect posture in standing.  Pt requires knee flexion with pillow under knees in supine with manual therapy to address hip  flexor length.  Pt is independent and compliant in HEP for core strength and flexibility and is making postural corrections and position changes with work tasks.  Pt will continue to benefit from skilled PT for flexibility, postural strength and manual to address hip flexor length.      Rehab Potential  Good    PT Frequency  2x / week    PT Duration  4 weeks    PT Treatment/Interventions  ADLs/Self Care Home Management;Moist Heat;Stair training;Gait training;Functional mobility training;Therapeutic activities;Neuromuscular re-education;Therapeutic exercise;Patient/family education;Balance training;Manual techniques;Passive range of motion;Dry needling    PT Next Visit Plan  continue hip flexibility, manual for hip flexibility, core strenth, erect posture, endurance    PT Home Exercise Plan  Access Code: [email or web address]    Consulted and Agree with Plan of Care  Patient       Patient will benefit from skilled therapeutic intervention in order to improve the following deficits and impairments:  Abnormal gait, Decreased activity tolerance, Decreased range of motion, Decreased endurance, Difficulty walking, Hypomobility, Increased muscle spasms, Improper body mechanics, Impaired flexibility, Postural dysfunction, Pain  Visit Diagnosis: Stiffness of left hip, not elsewhere classified  Muscle weakness (generalized)  Abnormal posture     Problem List Patient Active Problem List   Diagnosis Date Noted  . 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    Ruben Rodriguez, PT 11/15/18 9:30 AM  [facility] [address] [place], [place], [address] Phone: [phone]   Fax:  [phone]  Name: Ruben Rodriguez MRN: [id number] Date of Birth: 16-Nov-1945

## 2018-11-22 ENCOUNTER — Ambulatory Visit: Payer: Medicare HMO

## 2018-11-22 DIAGNOSIS — M25652 Stiffness of left hip, not elsewhere classified: Secondary | ICD-10-CM

## 2018-11-22 DIAGNOSIS — R293 Abnormal posture: Secondary | ICD-10-CM | POA: Diagnosis not present

## 2018-11-22 DIAGNOSIS — M6281 Muscle weakness (generalized): Secondary | ICD-10-CM

## 2018-11-22 NOTE — Therapy (Signed)
[facility] [address] [place], [place], [address] Phone: [phone]   Fax:  [phone]  Physical Therapy Treatment  Patient Details  Name: Ruben Rodriguez MRN: [id number] Date of Birth: November 03, 1945 Referring Provider (PT): Jovita Gamma, MD    Encounter Date: 11/22/2018  PT End of Session - 11/22/18 0927    Visit Number  7    Date for PT Re-Evaluation  11/26/18    Authorization Type  AETNA Medicare     PT Start Time  0846    PT Stop Time  0929    PT Time Calculation (min)  43 min    Activity Tolerance  No increased pain;Patient tolerated treatment well    Behavior During Therapy  [facility] for tasks assessed/performed       Past Medical History:  Diagnosis Date  . Arthritis   . BPH (benign prostatic hyperplasia)   . Chronic back pain   . Family history of adverse reaction to anesthesia    father died during carotid artery surgery  . GERD (gastroesophageal reflux disease)   . Hyperlipidemia   . Hypertension   . Pneumonia   . Spinal stenosis     Past Surgical History:  Procedure Laterality Date  . COLONOSCOPY    . FINGER SURGERY     to remove a tumor from right little finger  . LAMINECTOMY WITH POSTERIOR LATERAL ARTHRODESIS LEVEL 2 N/A 08/01/2018   Procedure: LUMBAR THREE- LUMBAR FOUR, LUMBAR FOUR-LUMBAR FIVE DECOMPRESSION, LUMBAR THREE-LUMBAR FOUR, LUMBAR FOUR-LUMBAR FIVE POSTEROLATERAL ARTHRODESIS;  Surgeon: Jovita Gamma, MD;  Location: [place];  Service: Neurosurgery;  Laterality: N/A;    There were no vitals filed for this visit.  Subjective Assessment - 11/22/18 0849    Subjective  I am stiff today.  I drove to [place] last weekend and then worked 2 days so I think that I was sitting too long.      Patient Stated Goals  improve flexibility    Currently in Pain?  Yes    Pain Score  3     Pain Location  Back    Pain Orientation  Lower    Pain Descriptors / Indicators  Aching;Throbbing    Pain  Type  Surgical pain    Pain Onset  More than a month ago    Pain Frequency  Intermittent    Aggravating Factors   sitting too long, random    Pain Relieving Factors  change of position                       [facility] Adult PT Treatment/Exercise - 11/22/18 0001      Lumbar Exercises: Standing   Other Standing Lumbar Exercises  extension x 5      Lumbar Exercises: Prone   Other Prone Lumbar Exercises  prone for stretch to bil hip flexors x 5 minutes- PT provided overpressure on the Lt      Knee/Hip Exercises: Stretches   Active Hamstring Stretch  Both;20 seconds;3 reps    Active Hamstring Stretch Limitations  strap    Hip Flexor Stretch  Left;3 reps;20 seconds   leg straight on mat with concurrent Addaday   Piriformis Stretch  Both;2 reps;20 seconds    Piriformis Stretch Limitations  seated    Other Knee/Hip Stretches  butterfly stretch 10 secx5 each      Knee/Hip Exercises: Aerobic   Nustep  Level 3x 10 minutes   PT present to discuss progress  Knee/Hip Exercises: Supine   Bridges  Strengthening;2 sets;Both;10 reps      Manual Therapy   Manual Therapy  Soft tissue mobilization    Manual therapy comments  Addaday with red attachment to Lt hip flexor and lateral quad               PT Short Term Goals - 11/22/18 0851      PT SHORT TERM GOAL #1   Title  Pt will demo consistency and independence with his initial HEP to decrease pain and improve hip flexibility.     Status  Achieved        PT Long Term Goals - 11/22/18 0850      PT LONG TERM GOAL #3   Title  Pt will report atleast 40% improvement in his pain and flexibility from the start of PT which will allow him to return to other extra-curricular activity.     Baseline  20%     Time  4    Period  Weeks    Status  On-going            Plan - 11/22/18 0908    Clinical Impression Statement  Pt traveled last week and had a long drive to [facility] followed by work for 2 days where he sat a  lot.  Pt is stiff due to this and reduced compliance with stretching while traveling.  Pt continues to demonstrate flexed trunk posture and reduced hip flexor flexibility.  Pt requires tactile and verbal cues to reduce trunk substitution with exercise.  Pt will continue to benefit from skilled PT for flexibility, core strength and endurance.      Rehab Potential  Good    PT Frequency  2x / week    PT Duration  4 weeks    PT Treatment/Interventions  ADLs/Self Care Home Management;Moist Heat;Stair training;Gait training;Functional mobility training;Therapeutic activities;Neuromuscular re-education;Therapeutic exercise;Patient/family education;Balance training;Manual techniques;Passive range of motion;Dry needling    PT Next Visit Plan  1 more session- D/C next session to HEP.  Check hip A/ROM and strength for LTG    PT Home Exercise Plan  Access Code: [email or web address]    Consulted and Agree with Plan of Care  Patient       Patient will benefit from skilled therapeutic intervention in order to improve the following deficits and impairments:  Abnormal gait, Decreased activity tolerance, Decreased range of motion, Decreased endurance, Difficulty walking, Hypomobility, Increased muscle spasms, Improper body mechanics, Impaired flexibility, Postural dysfunction, Pain  Visit Diagnosis: Stiffness of left hip, not elsewhere classified  Muscle weakness (generalized)  Abnormal posture     Problem List Patient Active Problem List   Diagnosis Date Noted  . 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    Sigurd Sos, PT 11/22/18 9:31 AM  [facility] [address], [place], [place], [address] Phone: [phone]   Fax:  [phone]  Name: Ruben Rodriguez MRN: [id number] Date of Birth: June 19, 1946

## 2018-11-26 ENCOUNTER — Ambulatory Visit: Payer: Medicare HMO | Attending: Neurosurgery | Admitting: Physical Therapy

## 2018-11-26 ENCOUNTER — Encounter: Payer: Self-pay | Admitting: Physical Therapy

## 2018-11-26 DIAGNOSIS — R293 Abnormal posture: Secondary | ICD-10-CM | POA: Diagnosis not present

## 2018-11-26 DIAGNOSIS — M6281 Muscle weakness (generalized): Secondary | ICD-10-CM | POA: Insufficient documentation

## 2018-11-26 DIAGNOSIS — M25652 Stiffness of left hip, not elsewhere classified: Secondary | ICD-10-CM | POA: Diagnosis not present

## 2018-11-26 NOTE — Therapy (Addendum)
[facility] [address], [place], [place], [address] Phone: [phone]   Fax:  [phone]  Physical Therapy Treatment  Patient Details  Name: Ruben Rodriguez MRN: [id number] Date of Birth: 06-23-1946 Referring Provider (PT): Jovita Gamma, MD    Encounter Date: 11/26/2018  PT End of Session - 11/26/18 0841    Visit Number  8    Date for PT Re-Evaluation  11/26/18    Authorization Type  AETNA Medicare     Authorization Time Period  10/26/18 to 11/26/18    PT Start Time  0840    PT Stop Time  0910    PT Time Calculation (min)  30 min    Activity Tolerance  No increased pain;Patient tolerated treatment well    Behavior During Therapy  [facility] for tasks assessed/performed       Past Medical History:  Diagnosis Date  . Arthritis   . BPH (benign prostatic hyperplasia)   . Chronic back pain   . Family history of adverse reaction to anesthesia    father died during carotid artery surgery  . GERD (gastroesophageal reflux disease)   . Hyperlipidemia   . Hypertension   . Pneumonia   . Spinal stenosis     Past Surgical History:  Procedure Laterality Date  . COLONOSCOPY    . FINGER SURGERY     to remove a tumor from right little finger  . LAMINECTOMY WITH POSTERIOR LATERAL ARTHRODESIS LEVEL 2 N/A 08/01/2018   Procedure: LUMBAR THREE- LUMBAR FOUR, LUMBAR FOUR-LUMBAR FIVE DECOMPRESSION, LUMBAR THREE-LUMBAR FOUR, LUMBAR FOUR-LUMBAR FIVE POSTEROLATERAL ARTHRODESIS;  Surgeon: Jovita Gamma, MD;  Location: [place];  Service: Neurosurgery;  Laterality: N/A;    There were no vitals filed for this visit.  Subjective Assessment - 11/26/18 0843    Subjective  I am ready to do this on my own.     Currently in Pain?  No/denies    Multiple Pain Sites  No         [facility] PT Assessment - 11/26/18 0001      Assessment   Medical Diagnosis  Arthrodesis status    Referring Provider (PT)  Jovita Gamma, MD     Onset  Date/Surgical Date  08/01/18      AROM   Overall AROM   --   LT hip AROM flexion 119 degrees, extension 0-3 degrees activ     Strength   Left Hip Flexion  4+/5    Left Hip Extension  3+/5    Left Hip ABduction  4-/5                   [facility] Adult PT Treatment/Exercise - 11/26/18 0001      Lumbar Exercises: Stretches   Active Hamstring Stretch  Right;Left;3 reps;20 seconds   VC for flat back     Knee/Hip Exercises: Aerobic   Nustep  Level 3x 10 minutes   PTA present to discuss progress            PT Education - 11/26/18 0914    Education Details  General review of current HEP    Person(s) Educated  Patient    Methods  Explanation    Comprehension  Returned demonstration;Verbalized understanding       PT Short Term Goals - 11/22/18 0851      PT SHORT TERM GOAL #1   Title  Pt will demo consistency and independence with his initial HEP to decrease pain and improve hip  flexibility.     Status  Achieved        PT Long Term Goals - 11/26/18 1751      PT LONG TERM GOAL #1   Title  Pt will have increase in Lt hip flexion to atleast 120 deg and extension to atleast 0 deg which will assist with sit to stand posture.     Time  4    Period  Weeks    Status  Partially Met      PT LONG TERM GOAL #2   Title  Pt will have atleast 4/5 MMT strength of the Lt hip which will improve his efficency with daily activity.     Time  4    Period  Weeks    Status  Partially Met      PT LONG TERM GOAL #3   Title  Pt will report atleast 40% improvement in his pain and flexibility from the start of PT which will allow him to return to other extra-curricular activity.     Time  4    Period  Weeks    Status  Achieved   30% 50%      PT LONG TERM GOAL #4   Title  Pt will demo understanding of his advanced HEP to allow for progression to a gym program for further strength and flexibility gains.     Time  4    Period  Weeks    Status  Achieved            Plan -  11/26/18 0258    Clinical Impression Statement  Pt reports since his evaluation he feels day to day 30%-50% improved, less pain. He is independent in his final HEP and appears very motivated to continue with his exercises at home. Left hip strength all improved since eval by at least half a grade and left hip AROM also improved. He did not quite reach the hip flexion & extension ROm goal but certainly close.      Rehab Potential  Good    PT Frequency  2x / week    PT Duration  4 weeks    PT Treatment/Interventions  ADLs/Self Care Home Management;Moist Heat;Stair training;Gait training;Functional mobility training;Therapeutic activities;Neuromuscular re-education;Therapeutic exercise;Patient/family education;Balance training;Manual techniques;Passive range of motion;Dry needling    PT Next Visit Plan  DC to HEP    PT Home Exercise Plan  Access Code: [email or web address]    Consulted and Agree with Plan of Care  Patient       Patient will benefit from skilled therapeutic intervention in order to improve the following deficits and impairments:  Abnormal gait, Decreased activity tolerance, Decreased range of motion, Decreased endurance, Difficulty walking, Hypomobility, Increased muscle spasms, Improper body mechanics, Impaired flexibility, Postural dysfunction, Pain  Visit Diagnosis: Stiffness of left hip, not elsewhere classified  Muscle weakness (generalized)  Abnormal posture     Problem List Patient Active Problem List   Diagnosis Date Noted  . 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   Myrene Galas, PTA 11/26/18 9:16 AM  PHYSICAL THERAPY DISCHARGE SUMMARY  Visits from Start of Care: 8  Current functional level related to goals / functional outcomes: Pt requested to D/C to HEP.  He is making steady progress and will continue with HEP to address strength and flexibility deficits.     Remaining deficits: Hip stiffness, trunk flexion in standing, weakness in core and  hips-HEP in place to address these deficits.     Education / Equipment: HEP,  posture/body mechanics Plan: Patient agrees to discharge.  Patient goals were partially met. Patient is being discharged due to being pleased with the current functional level.  ?????        Sigurd Sos, PT 11/26/18 10:23 AM  [facility] [address], [place], [place], [address] Phone: [phone]   Fax:  [phone]  Name: Ruben Rodriguez MRN: [id number] Date of Birth: 01/28/1946

## 2019-02-25 DIAGNOSIS — Z981 Arthrodesis status: Secondary | ICD-10-CM | POA: Diagnosis not present

## 2019-02-25 DIAGNOSIS — M47816 Spondylosis without myelopathy or radiculopathy, lumbar region: Secondary | ICD-10-CM | POA: Diagnosis not present

## 2019-02-25 DIAGNOSIS — M5136 Other intervertebral disc degeneration, lumbar region: Secondary | ICD-10-CM | POA: Diagnosis not present

## 2019-03-13 DIAGNOSIS — Z7189 Other specified counseling: Secondary | ICD-10-CM | POA: Diagnosis not present

## 2019-03-13 DIAGNOSIS — M7022 Olecranon bursitis, left elbow: Secondary | ICD-10-CM | POA: Diagnosis not present

## 2019-03-28 DIAGNOSIS — H10413 Chronic giant papillary conjunctivitis, bilateral: Secondary | ICD-10-CM | POA: Diagnosis not present

## 2019-03-28 DIAGNOSIS — H26492 Other secondary cataract, left eye: Secondary | ICD-10-CM | POA: Diagnosis not present

## 2019-03-28 DIAGNOSIS — H524 Presbyopia: Secondary | ICD-10-CM | POA: Diagnosis not present

## 2019-03-28 DIAGNOSIS — H43813 Vitreous degeneration, bilateral: Secondary | ICD-10-CM | POA: Diagnosis not present

## 2019-04-15 DIAGNOSIS — R7301 Impaired fasting glucose: Secondary | ICD-10-CM | POA: Diagnosis not present

## 2019-04-15 DIAGNOSIS — I10 Essential (primary) hypertension: Secondary | ICD-10-CM | POA: Diagnosis not present

## 2019-04-15 DIAGNOSIS — E782 Mixed hyperlipidemia: Secondary | ICD-10-CM | POA: Diagnosis not present

## 2019-04-18 DIAGNOSIS — E669 Obesity, unspecified: Secondary | ICD-10-CM | POA: Diagnosis not present

## 2019-04-18 DIAGNOSIS — I10 Essential (primary) hypertension: Secondary | ICD-10-CM | POA: Diagnosis not present

## 2019-04-18 DIAGNOSIS — Z6832 Body mass index (BMI) 32.0-32.9, adult: Secondary | ICD-10-CM | POA: Diagnosis not present

## 2019-04-18 DIAGNOSIS — D692 Other nonthrombocytopenic purpura: Secondary | ICD-10-CM | POA: Diagnosis not present

## 2019-04-18 DIAGNOSIS — Z7189 Other specified counseling: Secondary | ICD-10-CM | POA: Diagnosis not present

## 2019-04-18 DIAGNOSIS — R7301 Impaired fasting glucose: Secondary | ICD-10-CM | POA: Diagnosis not present

## 2019-04-18 DIAGNOSIS — E782 Mixed hyperlipidemia: Secondary | ICD-10-CM | POA: Diagnosis not present

## 2019-04-18 DIAGNOSIS — N179 Acute kidney failure, unspecified: Secondary | ICD-10-CM | POA: Diagnosis not present

## 2019-04-18 DIAGNOSIS — M4800 Spinal stenosis, site unspecified: Secondary | ICD-10-CM | POA: Diagnosis not present

## 2019-04-25 DIAGNOSIS — H26492 Other secondary cataract, left eye: Secondary | ICD-10-CM | POA: Diagnosis not present

## 2019-05-20 DIAGNOSIS — N179 Acute kidney failure, unspecified: Secondary | ICD-10-CM | POA: Diagnosis not present

## 2019-07-04 DIAGNOSIS — L821 Other seborrheic keratosis: Secondary | ICD-10-CM | POA: Diagnosis not present

## 2019-07-04 DIAGNOSIS — D225 Melanocytic nevi of trunk: Secondary | ICD-10-CM | POA: Diagnosis not present

## 2019-07-04 DIAGNOSIS — D1801 Hemangioma of skin and subcutaneous tissue: Secondary | ICD-10-CM | POA: Diagnosis not present

## 2019-07-04 DIAGNOSIS — L570 Actinic keratosis: Secondary | ICD-10-CM | POA: Diagnosis not present

## 2019-07-04 DIAGNOSIS — D485 Neoplasm of uncertain behavior of skin: Secondary | ICD-10-CM | POA: Diagnosis not present

## 2019-07-04 DIAGNOSIS — D692 Other nonthrombocytopenic purpura: Secondary | ICD-10-CM | POA: Diagnosis not present

## 2019-07-04 DIAGNOSIS — C44519 Basal cell carcinoma of skin of other part of trunk: Secondary | ICD-10-CM | POA: Diagnosis not present

## 2019-07-04 DIAGNOSIS — C44619 Basal cell carcinoma of skin of left upper limb, including shoulder: Secondary | ICD-10-CM | POA: Diagnosis not present

## 2019-07-04 DIAGNOSIS — Z85828 Personal history of other malignant neoplasm of skin: Secondary | ICD-10-CM | POA: Diagnosis not present

## 2019-08-19 DIAGNOSIS — Z981 Arthrodesis status: Secondary | ICD-10-CM | POA: Diagnosis not present

## 2019-10-24 DIAGNOSIS — R69 Illness, unspecified: Secondary | ICD-10-CM | POA: Diagnosis not present

## 2019-10-25 ENCOUNTER — Ambulatory Visit: Payer: Medicare HMO

## 2019-11-02 ENCOUNTER — Ambulatory Visit: Payer: Medicare HMO | Attending: Internal Medicine

## 2019-11-02 DIAGNOSIS — Z23 Encounter for immunization: Secondary | ICD-10-CM | POA: Insufficient documentation

## 2019-11-02 NOTE — Progress Notes (Signed)
   Covid-19 Vaccination Clinic  Name:  Ruben Rodriguez    MRN: [id number] DOB: [birth year]  11/02/2019  [name] was observed post Covid-19 immunization for 15 minutes without incidence. He was provided with Vaccine Information Sheet and instruction to access the V-Safe system.   [name] was instructed to call 911 with any severe reactions post vaccine: Marland Kitchen Difficulty breathing  . Swelling of your face and throat  . A fast heartbeat  . A bad rash all over your body  . Dizziness and weakness    Immunizations Administered    Name Date Dose VIS Date Route   Pfizer COVID-19 Vaccine 11/02/2019  3:58 PM 0.3 mL 09/06/2019 Intramuscular   Manufacturer: [place]   Lot: [id number]   [place]: [id number]

## 2019-11-05 ENCOUNTER — Ambulatory Visit: Payer: Medicare HMO

## 2019-11-06 DIAGNOSIS — R7309 Other abnormal glucose: Secondary | ICD-10-CM | POA: Diagnosis not present

## 2019-11-06 DIAGNOSIS — D692 Other nonthrombocytopenic purpura: Secondary | ICD-10-CM | POA: Diagnosis not present

## 2019-11-06 DIAGNOSIS — M4800 Spinal stenosis, site unspecified: Secondary | ICD-10-CM | POA: Diagnosis not present

## 2019-11-06 DIAGNOSIS — Z0000 Encounter for general adult medical examination without abnormal findings: Secondary | ICD-10-CM | POA: Diagnosis not present

## 2019-11-06 DIAGNOSIS — Z125 Encounter for screening for malignant neoplasm of prostate: Secondary | ICD-10-CM | POA: Diagnosis not present

## 2019-11-06 DIAGNOSIS — E782 Mixed hyperlipidemia: Secondary | ICD-10-CM | POA: Diagnosis not present

## 2019-11-06 DIAGNOSIS — I10 Essential (primary) hypertension: Secondary | ICD-10-CM | POA: Diagnosis not present

## 2019-11-06 DIAGNOSIS — R972 Elevated prostate specific antigen [PSA]: Secondary | ICD-10-CM | POA: Diagnosis not present

## 2019-11-06 DIAGNOSIS — N400 Enlarged prostate without lower urinary tract symptoms: Secondary | ICD-10-CM | POA: Diagnosis not present

## 2019-11-06 DIAGNOSIS — E669 Obesity, unspecified: Secondary | ICD-10-CM | POA: Diagnosis not present

## 2019-11-06 DIAGNOSIS — L309 Dermatitis, unspecified: Secondary | ICD-10-CM | POA: Diagnosis not present

## 2019-11-12 DIAGNOSIS — Z1211 Encounter for screening for malignant neoplasm of colon: Secondary | ICD-10-CM | POA: Diagnosis not present

## 2019-11-12 DIAGNOSIS — Z0000 Encounter for general adult medical examination without abnormal findings: Secondary | ICD-10-CM | POA: Diagnosis not present

## 2019-11-27 ENCOUNTER — Ambulatory Visit: Payer: Medicare HMO | Attending: Internal Medicine

## 2019-11-27 DIAGNOSIS — Z23 Encounter for immunization: Secondary | ICD-10-CM

## 2019-11-27 NOTE — Progress Notes (Signed)
   Covid-19 Vaccination Clinic  Name:  Ruben Rodriguez    MRN: [id number] DOB: Oct 27, 1945  11/27/2019  [name] was observed post Covid-19 immunization for 30 minutes based on pre-vaccination screening without incident. He was provided with Vaccine Information Sheet and instruction to access the V-Safe system.   [name] was instructed to call 911 with any severe reactions post vaccine: Marland Kitchen Difficulty breathing  . Swelling of face and throat  . A fast heartbeat  . A bad rash all over body  . Dizziness and weakness   Immunizations Administered    Name Date Dose VIS Date Route   Pfizer COVID-19 Vaccine 11/27/2019 12:10 PM 0.3 mL 09/06/2019 Intramuscular   Manufacturer: [place]   Lot: [id number]   [place]: [id number]

## 2019-12-03 DIAGNOSIS — R69 Illness, unspecified: Secondary | ICD-10-CM | POA: Diagnosis not present

## 2019-12-18 DIAGNOSIS — R195 Other fecal abnormalities: Secondary | ICD-10-CM | POA: Diagnosis not present

## 2019-12-18 DIAGNOSIS — Z86010 Personal history of colonic polyps: Secondary | ICD-10-CM | POA: Diagnosis not present

## 2020-01-22 DIAGNOSIS — Z1159 Encounter for screening for other viral diseases: Secondary | ICD-10-CM | POA: Diagnosis not present

## 2020-01-27 DIAGNOSIS — R195 Other fecal abnormalities: Secondary | ICD-10-CM | POA: Diagnosis not present

## 2020-01-27 DIAGNOSIS — K644 Residual hemorrhoidal skin tags: Secondary | ICD-10-CM | POA: Diagnosis not present

## 2020-01-27 DIAGNOSIS — K648 Other hemorrhoids: Secondary | ICD-10-CM | POA: Diagnosis not present

## 2020-01-27 DIAGNOSIS — K633 Ulcer of intestine: Secondary | ICD-10-CM | POA: Diagnosis not present

## 2020-01-27 DIAGNOSIS — Z86010 Personal history of colonic polyps: Secondary | ICD-10-CM | POA: Diagnosis not present

## 2020-01-27 DIAGNOSIS — K5730 Diverticulosis of large intestine without perforation or abscess without bleeding: Secondary | ICD-10-CM | POA: Diagnosis not present

## 2020-01-29 DIAGNOSIS — K633 Ulcer of intestine: Secondary | ICD-10-CM | POA: Diagnosis not present

## 2020-01-29 DIAGNOSIS — R195 Other fecal abnormalities: Secondary | ICD-10-CM | POA: Diagnosis not present

## 2020-02-19 DIAGNOSIS — R69 Illness, unspecified: Secondary | ICD-10-CM | POA: Diagnosis not present

## 2020-03-25 DIAGNOSIS — M9903 Segmental and somatic dysfunction of lumbar region: Secondary | ICD-10-CM | POA: Diagnosis not present

## 2020-03-25 DIAGNOSIS — M5136 Other intervertebral disc degeneration, lumbar region: Secondary | ICD-10-CM | POA: Diagnosis not present

## 2020-03-25 DIAGNOSIS — M5137 Other intervertebral disc degeneration, lumbosacral region: Secondary | ICD-10-CM | POA: Diagnosis not present

## 2020-03-25 DIAGNOSIS — M5032 Other cervical disc degeneration, mid-cervical region, unspecified level: Secondary | ICD-10-CM | POA: Diagnosis not present

## 2020-03-25 DIAGNOSIS — M9901 Segmental and somatic dysfunction of cervical region: Secondary | ICD-10-CM | POA: Diagnosis not present

## 2020-03-25 DIAGNOSIS — M9904 Segmental and somatic dysfunction of sacral region: Secondary | ICD-10-CM | POA: Diagnosis not present

## 2020-04-02 DIAGNOSIS — M5032 Other cervical disc degeneration, mid-cervical region, unspecified level: Secondary | ICD-10-CM | POA: Diagnosis not present

## 2020-04-02 DIAGNOSIS — M9901 Segmental and somatic dysfunction of cervical region: Secondary | ICD-10-CM | POA: Diagnosis not present

## 2020-04-02 DIAGNOSIS — M5137 Other intervertebral disc degeneration, lumbosacral region: Secondary | ICD-10-CM | POA: Diagnosis not present

## 2020-04-02 DIAGNOSIS — M9903 Segmental and somatic dysfunction of lumbar region: Secondary | ICD-10-CM | POA: Diagnosis not present

## 2020-04-02 DIAGNOSIS — M5136 Other intervertebral disc degeneration, lumbar region: Secondary | ICD-10-CM | POA: Diagnosis not present

## 2020-04-02 DIAGNOSIS — M9904 Segmental and somatic dysfunction of sacral region: Secondary | ICD-10-CM | POA: Diagnosis not present

## 2020-04-04 DIAGNOSIS — M9901 Segmental and somatic dysfunction of cervical region: Secondary | ICD-10-CM | POA: Diagnosis not present

## 2020-04-04 DIAGNOSIS — M9904 Segmental and somatic dysfunction of sacral region: Secondary | ICD-10-CM | POA: Diagnosis not present

## 2020-04-04 DIAGNOSIS — M9903 Segmental and somatic dysfunction of lumbar region: Secondary | ICD-10-CM | POA: Diagnosis not present

## 2020-04-04 DIAGNOSIS — M5137 Other intervertebral disc degeneration, lumbosacral region: Secondary | ICD-10-CM | POA: Diagnosis not present

## 2020-04-04 DIAGNOSIS — M5032 Other cervical disc degeneration, mid-cervical region, unspecified level: Secondary | ICD-10-CM | POA: Diagnosis not present

## 2020-04-04 DIAGNOSIS — M5136 Other intervertebral disc degeneration, lumbar region: Secondary | ICD-10-CM | POA: Diagnosis not present

## 2020-04-06 DIAGNOSIS — M5137 Other intervertebral disc degeneration, lumbosacral region: Secondary | ICD-10-CM | POA: Diagnosis not present

## 2020-04-06 DIAGNOSIS — M5136 Other intervertebral disc degeneration, lumbar region: Secondary | ICD-10-CM | POA: Diagnosis not present

## 2020-04-06 DIAGNOSIS — M9903 Segmental and somatic dysfunction of lumbar region: Secondary | ICD-10-CM | POA: Diagnosis not present

## 2020-04-06 DIAGNOSIS — M5032 Other cervical disc degeneration, mid-cervical region, unspecified level: Secondary | ICD-10-CM | POA: Diagnosis not present

## 2020-04-06 DIAGNOSIS — M9904 Segmental and somatic dysfunction of sacral region: Secondary | ICD-10-CM | POA: Diagnosis not present

## 2020-04-06 DIAGNOSIS — M9901 Segmental and somatic dysfunction of cervical region: Secondary | ICD-10-CM | POA: Diagnosis not present

## 2020-04-08 DIAGNOSIS — M5137 Other intervertebral disc degeneration, lumbosacral region: Secondary | ICD-10-CM | POA: Diagnosis not present

## 2020-04-08 DIAGNOSIS — M5032 Other cervical disc degeneration, mid-cervical region, unspecified level: Secondary | ICD-10-CM | POA: Diagnosis not present

## 2020-04-08 DIAGNOSIS — M9903 Segmental and somatic dysfunction of lumbar region: Secondary | ICD-10-CM | POA: Diagnosis not present

## 2020-04-08 DIAGNOSIS — M5136 Other intervertebral disc degeneration, lumbar region: Secondary | ICD-10-CM | POA: Diagnosis not present

## 2020-04-08 DIAGNOSIS — M9904 Segmental and somatic dysfunction of sacral region: Secondary | ICD-10-CM | POA: Diagnosis not present

## 2020-04-08 DIAGNOSIS — M9901 Segmental and somatic dysfunction of cervical region: Secondary | ICD-10-CM | POA: Diagnosis not present

## 2020-04-13 DIAGNOSIS — M9903 Segmental and somatic dysfunction of lumbar region: Secondary | ICD-10-CM | POA: Diagnosis not present

## 2020-04-13 DIAGNOSIS — M5136 Other intervertebral disc degeneration, lumbar region: Secondary | ICD-10-CM | POA: Diagnosis not present

## 2020-04-13 DIAGNOSIS — M5032 Other cervical disc degeneration, mid-cervical region, unspecified level: Secondary | ICD-10-CM | POA: Diagnosis not present

## 2020-04-13 DIAGNOSIS — M9904 Segmental and somatic dysfunction of sacral region: Secondary | ICD-10-CM | POA: Diagnosis not present

## 2020-04-13 DIAGNOSIS — M5137 Other intervertebral disc degeneration, lumbosacral region: Secondary | ICD-10-CM | POA: Diagnosis not present

## 2020-04-13 DIAGNOSIS — M9901 Segmental and somatic dysfunction of cervical region: Secondary | ICD-10-CM | POA: Diagnosis not present

## 2020-04-15 DIAGNOSIS — M5137 Other intervertebral disc degeneration, lumbosacral region: Secondary | ICD-10-CM | POA: Diagnosis not present

## 2020-04-15 DIAGNOSIS — M5032 Other cervical disc degeneration, mid-cervical region, unspecified level: Secondary | ICD-10-CM | POA: Diagnosis not present

## 2020-04-15 DIAGNOSIS — M9903 Segmental and somatic dysfunction of lumbar region: Secondary | ICD-10-CM | POA: Diagnosis not present

## 2020-04-15 DIAGNOSIS — M9904 Segmental and somatic dysfunction of sacral region: Secondary | ICD-10-CM | POA: Diagnosis not present

## 2020-04-15 DIAGNOSIS — M5136 Other intervertebral disc degeneration, lumbar region: Secondary | ICD-10-CM | POA: Diagnosis not present

## 2020-04-15 DIAGNOSIS — M9901 Segmental and somatic dysfunction of cervical region: Secondary | ICD-10-CM | POA: Diagnosis not present

## 2020-04-20 DIAGNOSIS — M5032 Other cervical disc degeneration, mid-cervical region, unspecified level: Secondary | ICD-10-CM | POA: Diagnosis not present

## 2020-04-20 DIAGNOSIS — M5136 Other intervertebral disc degeneration, lumbar region: Secondary | ICD-10-CM | POA: Diagnosis not present

## 2020-04-20 DIAGNOSIS — M5137 Other intervertebral disc degeneration, lumbosacral region: Secondary | ICD-10-CM | POA: Diagnosis not present

## 2020-04-20 DIAGNOSIS — M9903 Segmental and somatic dysfunction of lumbar region: Secondary | ICD-10-CM | POA: Diagnosis not present

## 2020-04-20 DIAGNOSIS — M9901 Segmental and somatic dysfunction of cervical region: Secondary | ICD-10-CM | POA: Diagnosis not present

## 2020-04-20 DIAGNOSIS — M9904 Segmental and somatic dysfunction of sacral region: Secondary | ICD-10-CM | POA: Diagnosis not present

## 2020-04-22 DIAGNOSIS — M5136 Other intervertebral disc degeneration, lumbar region: Secondary | ICD-10-CM | POA: Diagnosis not present

## 2020-04-22 DIAGNOSIS — M9904 Segmental and somatic dysfunction of sacral region: Secondary | ICD-10-CM | POA: Diagnosis not present

## 2020-04-22 DIAGNOSIS — M5032 Other cervical disc degeneration, mid-cervical region, unspecified level: Secondary | ICD-10-CM | POA: Diagnosis not present

## 2020-04-22 DIAGNOSIS — M9903 Segmental and somatic dysfunction of lumbar region: Secondary | ICD-10-CM | POA: Diagnosis not present

## 2020-04-22 DIAGNOSIS — M9901 Segmental and somatic dysfunction of cervical region: Secondary | ICD-10-CM | POA: Diagnosis not present

## 2020-04-22 DIAGNOSIS — M5137 Other intervertebral disc degeneration, lumbosacral region: Secondary | ICD-10-CM | POA: Diagnosis not present

## 2020-04-29 DIAGNOSIS — M9904 Segmental and somatic dysfunction of sacral region: Secondary | ICD-10-CM | POA: Diagnosis not present

## 2020-04-29 DIAGNOSIS — M9903 Segmental and somatic dysfunction of lumbar region: Secondary | ICD-10-CM | POA: Diagnosis not present

## 2020-04-29 DIAGNOSIS — M5032 Other cervical disc degeneration, mid-cervical region, unspecified level: Secondary | ICD-10-CM | POA: Diagnosis not present

## 2020-04-29 DIAGNOSIS — M5136 Other intervertebral disc degeneration, lumbar region: Secondary | ICD-10-CM | POA: Diagnosis not present

## 2020-04-29 DIAGNOSIS — M5137 Other intervertebral disc degeneration, lumbosacral region: Secondary | ICD-10-CM | POA: Diagnosis not present

## 2020-04-29 DIAGNOSIS — M9901 Segmental and somatic dysfunction of cervical region: Secondary | ICD-10-CM | POA: Diagnosis not present

## 2020-05-06 DIAGNOSIS — R69 Illness, unspecified: Secondary | ICD-10-CM | POA: Diagnosis not present

## 2020-05-07 DIAGNOSIS — M5136 Other intervertebral disc degeneration, lumbar region: Secondary | ICD-10-CM | POA: Diagnosis not present

## 2020-05-07 DIAGNOSIS — M9903 Segmental and somatic dysfunction of lumbar region: Secondary | ICD-10-CM | POA: Diagnosis not present

## 2020-05-07 DIAGNOSIS — M5032 Other cervical disc degeneration, mid-cervical region, unspecified level: Secondary | ICD-10-CM | POA: Diagnosis not present

## 2020-05-07 DIAGNOSIS — M9901 Segmental and somatic dysfunction of cervical region: Secondary | ICD-10-CM | POA: Diagnosis not present

## 2020-05-07 DIAGNOSIS — M5137 Other intervertebral disc degeneration, lumbosacral region: Secondary | ICD-10-CM | POA: Diagnosis not present

## 2020-05-07 DIAGNOSIS — M9904 Segmental and somatic dysfunction of sacral region: Secondary | ICD-10-CM | POA: Diagnosis not present

## 2020-05-13 DIAGNOSIS — M5137 Other intervertebral disc degeneration, lumbosacral region: Secondary | ICD-10-CM | POA: Diagnosis not present

## 2020-05-13 DIAGNOSIS — M5032 Other cervical disc degeneration, mid-cervical region, unspecified level: Secondary | ICD-10-CM | POA: Diagnosis not present

## 2020-05-13 DIAGNOSIS — M9904 Segmental and somatic dysfunction of sacral region: Secondary | ICD-10-CM | POA: Diagnosis not present

## 2020-05-13 DIAGNOSIS — M5136 Other intervertebral disc degeneration, lumbar region: Secondary | ICD-10-CM | POA: Diagnosis not present

## 2020-05-13 DIAGNOSIS — M9903 Segmental and somatic dysfunction of lumbar region: Secondary | ICD-10-CM | POA: Diagnosis not present

## 2020-05-13 DIAGNOSIS — M9901 Segmental and somatic dysfunction of cervical region: Secondary | ICD-10-CM | POA: Diagnosis not present

## 2020-05-15 DIAGNOSIS — M4800 Spinal stenosis, site unspecified: Secondary | ICD-10-CM | POA: Diagnosis not present

## 2020-05-15 DIAGNOSIS — E782 Mixed hyperlipidemia: Secondary | ICD-10-CM | POA: Diagnosis not present

## 2020-05-15 DIAGNOSIS — R972 Elevated prostate specific antigen [PSA]: Secondary | ICD-10-CM | POA: Diagnosis not present

## 2020-05-15 DIAGNOSIS — R809 Proteinuria, unspecified: Secondary | ICD-10-CM | POA: Diagnosis not present

## 2020-05-15 DIAGNOSIS — I10 Essential (primary) hypertension: Secondary | ICD-10-CM | POA: Diagnosis not present

## 2020-05-15 DIAGNOSIS — R7309 Other abnormal glucose: Secondary | ICD-10-CM | POA: Diagnosis not present

## 2020-05-15 DIAGNOSIS — R7301 Impaired fasting glucose: Secondary | ICD-10-CM | POA: Diagnosis not present

## 2020-05-20 DIAGNOSIS — M9904 Segmental and somatic dysfunction of sacral region: Secondary | ICD-10-CM | POA: Diagnosis not present

## 2020-05-20 DIAGNOSIS — M9903 Segmental and somatic dysfunction of lumbar region: Secondary | ICD-10-CM | POA: Diagnosis not present

## 2020-05-20 DIAGNOSIS — M5136 Other intervertebral disc degeneration, lumbar region: Secondary | ICD-10-CM | POA: Diagnosis not present

## 2020-05-20 DIAGNOSIS — M5137 Other intervertebral disc degeneration, lumbosacral region: Secondary | ICD-10-CM | POA: Diagnosis not present

## 2020-05-20 DIAGNOSIS — M5032 Other cervical disc degeneration, mid-cervical region, unspecified level: Secondary | ICD-10-CM | POA: Diagnosis not present

## 2020-05-20 DIAGNOSIS — M9901 Segmental and somatic dysfunction of cervical region: Secondary | ICD-10-CM | POA: Diagnosis not present

## 2020-06-17 DIAGNOSIS — M5136 Other intervertebral disc degeneration, lumbar region: Secondary | ICD-10-CM | POA: Diagnosis not present

## 2020-06-17 DIAGNOSIS — M9901 Segmental and somatic dysfunction of cervical region: Secondary | ICD-10-CM | POA: Diagnosis not present

## 2020-06-17 DIAGNOSIS — M9903 Segmental and somatic dysfunction of lumbar region: Secondary | ICD-10-CM | POA: Diagnosis not present

## 2020-06-17 DIAGNOSIS — M5137 Other intervertebral disc degeneration, lumbosacral region: Secondary | ICD-10-CM | POA: Diagnosis not present

## 2020-06-17 DIAGNOSIS — M9904 Segmental and somatic dysfunction of sacral region: Secondary | ICD-10-CM | POA: Diagnosis not present

## 2020-06-17 DIAGNOSIS — M5032 Other cervical disc degeneration, mid-cervical region, unspecified level: Secondary | ICD-10-CM | POA: Diagnosis not present

## 2020-07-06 DIAGNOSIS — L820 Inflamed seborrheic keratosis: Secondary | ICD-10-CM | POA: Diagnosis not present

## 2020-07-06 DIAGNOSIS — D485 Neoplasm of uncertain behavior of skin: Secondary | ICD-10-CM | POA: Diagnosis not present

## 2020-07-06 DIAGNOSIS — Z85828 Personal history of other malignant neoplasm of skin: Secondary | ICD-10-CM | POA: Diagnosis not present

## 2020-07-06 DIAGNOSIS — L570 Actinic keratosis: Secondary | ICD-10-CM | POA: Diagnosis not present

## 2020-07-06 DIAGNOSIS — C44619 Basal cell carcinoma of skin of left upper limb, including shoulder: Secondary | ICD-10-CM | POA: Diagnosis not present

## 2020-07-06 DIAGNOSIS — R21 Rash and other nonspecific skin eruption: Secondary | ICD-10-CM | POA: Diagnosis not present

## 2020-07-06 DIAGNOSIS — D225 Melanocytic nevi of trunk: Secondary | ICD-10-CM | POA: Diagnosis not present

## 2020-07-06 DIAGNOSIS — L821 Other seborrheic keratosis: Secondary | ICD-10-CM | POA: Diagnosis not present

## 2020-07-16 DIAGNOSIS — M5032 Other cervical disc degeneration, mid-cervical region, unspecified level: Secondary | ICD-10-CM | POA: Diagnosis not present

## 2020-07-16 DIAGNOSIS — M5137 Other intervertebral disc degeneration, lumbosacral region: Secondary | ICD-10-CM | POA: Diagnosis not present

## 2020-07-16 DIAGNOSIS — M5136 Other intervertebral disc degeneration, lumbar region: Secondary | ICD-10-CM | POA: Diagnosis not present

## 2020-07-16 DIAGNOSIS — M9901 Segmental and somatic dysfunction of cervical region: Secondary | ICD-10-CM | POA: Diagnosis not present

## 2020-07-16 DIAGNOSIS — M9904 Segmental and somatic dysfunction of sacral region: Secondary | ICD-10-CM | POA: Diagnosis not present

## 2020-07-16 DIAGNOSIS — M9903 Segmental and somatic dysfunction of lumbar region: Secondary | ICD-10-CM | POA: Diagnosis not present

## 2020-08-12 DIAGNOSIS — M9901 Segmental and somatic dysfunction of cervical region: Secondary | ICD-10-CM | POA: Diagnosis not present

## 2020-08-12 DIAGNOSIS — M5137 Other intervertebral disc degeneration, lumbosacral region: Secondary | ICD-10-CM | POA: Diagnosis not present

## 2020-08-12 DIAGNOSIS — M5032 Other cervical disc degeneration, mid-cervical region, unspecified level: Secondary | ICD-10-CM | POA: Diagnosis not present

## 2020-08-12 DIAGNOSIS — M5136 Other intervertebral disc degeneration, lumbar region: Secondary | ICD-10-CM | POA: Diagnosis not present

## 2020-08-12 DIAGNOSIS — M9903 Segmental and somatic dysfunction of lumbar region: Secondary | ICD-10-CM | POA: Diagnosis not present

## 2020-08-12 DIAGNOSIS — M9904 Segmental and somatic dysfunction of sacral region: Secondary | ICD-10-CM | POA: Diagnosis not present

## 2020-08-26 DIAGNOSIS — M5032 Other cervical disc degeneration, mid-cervical region, unspecified level: Secondary | ICD-10-CM | POA: Diagnosis not present

## 2020-08-26 DIAGNOSIS — M5136 Other intervertebral disc degeneration, lumbar region: Secondary | ICD-10-CM | POA: Diagnosis not present

## 2020-08-26 DIAGNOSIS — M9901 Segmental and somatic dysfunction of cervical region: Secondary | ICD-10-CM | POA: Diagnosis not present

## 2020-08-26 DIAGNOSIS — M9904 Segmental and somatic dysfunction of sacral region: Secondary | ICD-10-CM | POA: Diagnosis not present

## 2020-08-26 DIAGNOSIS — M9903 Segmental and somatic dysfunction of lumbar region: Secondary | ICD-10-CM | POA: Diagnosis not present

## 2020-08-26 DIAGNOSIS — M5137 Other intervertebral disc degeneration, lumbosacral region: Secondary | ICD-10-CM | POA: Diagnosis not present

## 2020-08-27 DIAGNOSIS — H43813 Vitreous degeneration, bilateral: Secondary | ICD-10-CM | POA: Diagnosis not present

## 2020-08-27 DIAGNOSIS — H02051 Trichiasis without entropian right upper eyelid: Secondary | ICD-10-CM | POA: Diagnosis not present

## 2020-08-27 DIAGNOSIS — H02054 Trichiasis without entropian left upper eyelid: Secondary | ICD-10-CM | POA: Diagnosis not present

## 2020-08-27 DIAGNOSIS — Z961 Presence of intraocular lens: Secondary | ICD-10-CM | POA: Diagnosis not present

## 2020-09-30 DIAGNOSIS — M9904 Segmental and somatic dysfunction of sacral region: Secondary | ICD-10-CM | POA: Diagnosis not present

## 2020-09-30 DIAGNOSIS — M5137 Other intervertebral disc degeneration, lumbosacral region: Secondary | ICD-10-CM | POA: Diagnosis not present

## 2020-09-30 DIAGNOSIS — M5136 Other intervertebral disc degeneration, lumbar region: Secondary | ICD-10-CM | POA: Diagnosis not present

## 2020-09-30 DIAGNOSIS — M5032 Other cervical disc degeneration, mid-cervical region, unspecified level: Secondary | ICD-10-CM | POA: Diagnosis not present

## 2020-09-30 DIAGNOSIS — M9901 Segmental and somatic dysfunction of cervical region: Secondary | ICD-10-CM | POA: Diagnosis not present

## 2020-09-30 DIAGNOSIS — M9903 Segmental and somatic dysfunction of lumbar region: Secondary | ICD-10-CM | POA: Diagnosis not present

## 2020-11-04 DIAGNOSIS — M9901 Segmental and somatic dysfunction of cervical region: Secondary | ICD-10-CM | POA: Diagnosis not present

## 2020-11-04 DIAGNOSIS — M5137 Other intervertebral disc degeneration, lumbosacral region: Secondary | ICD-10-CM | POA: Diagnosis not present

## 2020-11-04 DIAGNOSIS — M9903 Segmental and somatic dysfunction of lumbar region: Secondary | ICD-10-CM | POA: Diagnosis not present

## 2020-11-04 DIAGNOSIS — M5136 Other intervertebral disc degeneration, lumbar region: Secondary | ICD-10-CM | POA: Diagnosis not present

## 2020-11-04 DIAGNOSIS — M9904 Segmental and somatic dysfunction of sacral region: Secondary | ICD-10-CM | POA: Diagnosis not present

## 2020-11-04 DIAGNOSIS — M5032 Other cervical disc degeneration, mid-cervical region, unspecified level: Secondary | ICD-10-CM | POA: Diagnosis not present

## 2020-11-18 DIAGNOSIS — R7301 Impaired fasting glucose: Secondary | ICD-10-CM | POA: Diagnosis not present

## 2020-11-18 DIAGNOSIS — E782 Mixed hyperlipidemia: Secondary | ICD-10-CM | POA: Diagnosis not present

## 2020-11-18 DIAGNOSIS — L309 Dermatitis, unspecified: Secondary | ICD-10-CM | POA: Diagnosis not present

## 2020-11-18 DIAGNOSIS — I10 Essential (primary) hypertension: Secondary | ICD-10-CM | POA: Diagnosis not present

## 2020-11-18 DIAGNOSIS — D692 Other nonthrombocytopenic purpura: Secondary | ICD-10-CM | POA: Diagnosis not present

## 2020-11-18 DIAGNOSIS — M4800 Spinal stenosis, site unspecified: Secondary | ICD-10-CM | POA: Diagnosis not present

## 2020-11-18 DIAGNOSIS — E669 Obesity, unspecified: Secondary | ICD-10-CM | POA: Diagnosis not present

## 2020-11-18 DIAGNOSIS — Z0000 Encounter for general adult medical examination without abnormal findings: Secondary | ICD-10-CM | POA: Diagnosis not present

## 2020-11-18 DIAGNOSIS — Z1211 Encounter for screening for malignant neoplasm of colon: Secondary | ICD-10-CM | POA: Diagnosis not present

## 2020-11-18 DIAGNOSIS — N400 Enlarged prostate without lower urinary tract symptoms: Secondary | ICD-10-CM | POA: Diagnosis not present

## 2020-11-18 DIAGNOSIS — R809 Proteinuria, unspecified: Secondary | ICD-10-CM | POA: Diagnosis not present

## 2020-11-18 DIAGNOSIS — Z125 Encounter for screening for malignant neoplasm of prostate: Secondary | ICD-10-CM | POA: Diagnosis not present

## 2020-12-16 DIAGNOSIS — M5032 Other cervical disc degeneration, mid-cervical region, unspecified level: Secondary | ICD-10-CM | POA: Diagnosis not present

## 2020-12-16 DIAGNOSIS — M9904 Segmental and somatic dysfunction of sacral region: Secondary | ICD-10-CM | POA: Diagnosis not present

## 2020-12-16 DIAGNOSIS — M5136 Other intervertebral disc degeneration, lumbar region: Secondary | ICD-10-CM | POA: Diagnosis not present

## 2020-12-16 DIAGNOSIS — M5137 Other intervertebral disc degeneration, lumbosacral region: Secondary | ICD-10-CM | POA: Diagnosis not present

## 2020-12-16 DIAGNOSIS — M9901 Segmental and somatic dysfunction of cervical region: Secondary | ICD-10-CM | POA: Diagnosis not present

## 2020-12-16 DIAGNOSIS — M9903 Segmental and somatic dysfunction of lumbar region: Secondary | ICD-10-CM | POA: Diagnosis not present

## 2021-02-23 DIAGNOSIS — M5032 Other cervical disc degeneration, mid-cervical region, unspecified level: Secondary | ICD-10-CM | POA: Diagnosis not present

## 2021-02-23 DIAGNOSIS — M5137 Other intervertebral disc degeneration, lumbosacral region: Secondary | ICD-10-CM | POA: Diagnosis not present

## 2021-02-23 DIAGNOSIS — M9901 Segmental and somatic dysfunction of cervical region: Secondary | ICD-10-CM | POA: Diagnosis not present

## 2021-02-23 DIAGNOSIS — M9904 Segmental and somatic dysfunction of sacral region: Secondary | ICD-10-CM | POA: Diagnosis not present

## 2021-02-23 DIAGNOSIS — M9903 Segmental and somatic dysfunction of lumbar region: Secondary | ICD-10-CM | POA: Diagnosis not present

## 2021-02-23 DIAGNOSIS — M5136 Other intervertebral disc degeneration, lumbar region: Secondary | ICD-10-CM | POA: Diagnosis not present

## 2021-03-23 DIAGNOSIS — M5137 Other intervertebral disc degeneration, lumbosacral region: Secondary | ICD-10-CM | POA: Diagnosis not present

## 2021-03-23 DIAGNOSIS — M5032 Other cervical disc degeneration, mid-cervical region, unspecified level: Secondary | ICD-10-CM | POA: Diagnosis not present

## 2021-03-23 DIAGNOSIS — M5136 Other intervertebral disc degeneration, lumbar region: Secondary | ICD-10-CM | POA: Diagnosis not present

## 2021-03-23 DIAGNOSIS — M9901 Segmental and somatic dysfunction of cervical region: Secondary | ICD-10-CM | POA: Diagnosis not present

## 2021-03-23 DIAGNOSIS — M9903 Segmental and somatic dysfunction of lumbar region: Secondary | ICD-10-CM | POA: Diagnosis not present

## 2021-03-23 DIAGNOSIS — M9904 Segmental and somatic dysfunction of sacral region: Secondary | ICD-10-CM | POA: Diagnosis not present

## 2021-04-20 DIAGNOSIS — M9901 Segmental and somatic dysfunction of cervical region: Secondary | ICD-10-CM | POA: Diagnosis not present

## 2021-04-20 DIAGNOSIS — M5137 Other intervertebral disc degeneration, lumbosacral region: Secondary | ICD-10-CM | POA: Diagnosis not present

## 2021-04-20 DIAGNOSIS — M9904 Segmental and somatic dysfunction of sacral region: Secondary | ICD-10-CM | POA: Diagnosis not present

## 2021-04-20 DIAGNOSIS — M5136 Other intervertebral disc degeneration, lumbar region: Secondary | ICD-10-CM | POA: Diagnosis not present

## 2021-04-20 DIAGNOSIS — M9903 Segmental and somatic dysfunction of lumbar region: Secondary | ICD-10-CM | POA: Diagnosis not present

## 2021-04-20 DIAGNOSIS — M5032 Other cervical disc degeneration, mid-cervical region, unspecified level: Secondary | ICD-10-CM | POA: Diagnosis not present

## 2021-05-18 DIAGNOSIS — M5032 Other cervical disc degeneration, mid-cervical region, unspecified level: Secondary | ICD-10-CM | POA: Diagnosis not present

## 2021-05-18 DIAGNOSIS — M5136 Other intervertebral disc degeneration, lumbar region: Secondary | ICD-10-CM | POA: Diagnosis not present

## 2021-05-18 DIAGNOSIS — M9901 Segmental and somatic dysfunction of cervical region: Secondary | ICD-10-CM | POA: Diagnosis not present

## 2021-05-18 DIAGNOSIS — M5137 Other intervertebral disc degeneration, lumbosacral region: Secondary | ICD-10-CM | POA: Diagnosis not present

## 2021-05-18 DIAGNOSIS — M9903 Segmental and somatic dysfunction of lumbar region: Secondary | ICD-10-CM | POA: Diagnosis not present

## 2021-05-18 DIAGNOSIS — M9904 Segmental and somatic dysfunction of sacral region: Secondary | ICD-10-CM | POA: Diagnosis not present

## 2021-05-19 DIAGNOSIS — R7301 Impaired fasting glucose: Secondary | ICD-10-CM | POA: Diagnosis not present

## 2021-05-19 DIAGNOSIS — Z8249 Family history of ischemic heart disease and other diseases of the circulatory system: Secondary | ICD-10-CM | POA: Diagnosis not present

## 2021-05-19 DIAGNOSIS — Z23 Encounter for immunization: Secondary | ICD-10-CM | POA: Diagnosis not present

## 2021-05-19 DIAGNOSIS — R809 Proteinuria, unspecified: Secondary | ICD-10-CM | POA: Diagnosis not present

## 2021-05-19 DIAGNOSIS — I10 Essential (primary) hypertension: Secondary | ICD-10-CM | POA: Diagnosis not present

## 2021-05-19 DIAGNOSIS — E782 Mixed hyperlipidemia: Secondary | ICD-10-CM | POA: Diagnosis not present

## 2021-05-20 ENCOUNTER — Other Ambulatory Visit: Payer: Self-pay | Admitting: Family Medicine

## 2021-05-20 DIAGNOSIS — Z8249 Family history of ischemic heart disease and other diseases of the circulatory system: Secondary | ICD-10-CM

## 2021-06-15 DIAGNOSIS — M5032 Other cervical disc degeneration, mid-cervical region, unspecified level: Secondary | ICD-10-CM | POA: Diagnosis not present

## 2021-06-15 DIAGNOSIS — M9903 Segmental and somatic dysfunction of lumbar region: Secondary | ICD-10-CM | POA: Diagnosis not present

## 2021-06-15 DIAGNOSIS — M9904 Segmental and somatic dysfunction of sacral region: Secondary | ICD-10-CM | POA: Diagnosis not present

## 2021-06-15 DIAGNOSIS — M5136 Other intervertebral disc degeneration, lumbar region: Secondary | ICD-10-CM | POA: Diagnosis not present

## 2021-06-15 DIAGNOSIS — M5137 Other intervertebral disc degeneration, lumbosacral region: Secondary | ICD-10-CM | POA: Diagnosis not present

## 2021-06-15 DIAGNOSIS — M9901 Segmental and somatic dysfunction of cervical region: Secondary | ICD-10-CM | POA: Diagnosis not present

## 2021-06-16 ENCOUNTER — Ambulatory Visit
Admission: RE | Admit: 2021-06-16 | Discharge: 2021-06-16 | Disposition: A | Discharge status: Home / Self Care | Payer: Self-pay | Source: Ambulatory Visit | Attending: Family Medicine | Admitting: Family Medicine

## 2021-06-16 DIAGNOSIS — Z8249 Family history of ischemic heart disease and other diseases of the circulatory system: Secondary | ICD-10-CM

## 2021-06-16 DIAGNOSIS — E785 Hyperlipidemia, unspecified: Secondary | ICD-10-CM | POA: Diagnosis not present

## 2021-06-16 DIAGNOSIS — N390 Urinary tract infection, site not specified: Secondary | ICD-10-CM | POA: Diagnosis not present

## 2021-07-13 DIAGNOSIS — M9901 Segmental and somatic dysfunction of cervical region: Secondary | ICD-10-CM | POA: Diagnosis not present

## 2021-07-13 DIAGNOSIS — M5136 Other intervertebral disc degeneration, lumbar region: Secondary | ICD-10-CM | POA: Diagnosis not present

## 2021-07-13 DIAGNOSIS — M9903 Segmental and somatic dysfunction of lumbar region: Secondary | ICD-10-CM | POA: Diagnosis not present

## 2021-07-13 DIAGNOSIS — M5137 Other intervertebral disc degeneration, lumbosacral region: Secondary | ICD-10-CM | POA: Diagnosis not present

## 2021-07-13 DIAGNOSIS — M5032 Other cervical disc degeneration, mid-cervical region, unspecified level: Secondary | ICD-10-CM | POA: Diagnosis not present

## 2021-07-13 DIAGNOSIS — M9904 Segmental and somatic dysfunction of sacral region: Secondary | ICD-10-CM | POA: Diagnosis not present

## 2021-07-27 NOTE — Progress Notes (Signed)
Ruben Hong, MD Reason for referral-coronary artery disease  HPI: 75 year old male for evaluation of coronary artery disease at request of Mayra Neer, MD. Renal Dopplers November 2018 showed no renal artery stenosis. Calcium score September 2022 561 which was 68 percentile.  Also with aortic atherosclerosis. LDL 8/22 149. Cardiology now asked to evaluate.  Patient states that he has dyspnea with climbing hills and has noticed more fatigue with activities recently.  There is no orthopnea, PND, exertional chest pain or syncope.  Occasional minimal pedal edema.  Cardiology now asked to evaluate.  Current Outpatient Medications  Medication Sig Dispense Refill   amLODipine (NORVASC) 10 MG tablet Take 10 mg by mouth daily.     aspirin 81 MG tablet Take 81 mg by mouth daily.     bisoprolol-hydrochlorothiazide (ZIAC) 10-6.25 MG per tablet Take 1 tablet by mouth daily.     finasteride (PROSCAR) 5 MG tablet Take 5 mg by mouth daily.     fluocinonide cream (LIDEX) 0.94 % Apply 1 application topically daily as needed (itchy ears).  (Patient not taking: Reported on 08/04/2021)     No current facility-administered medications for this visit.    Allergies  Allergen Reactions   Lipitor [Atorvastatin] Other (See Comments)    MUSCLE ACHES    Zetia [Ezetimibe] Other (See Comments)    ACHES    Zocor [Simvastatin] Other (See Comments)    ACHES   Other Rash    FISH    Penicillins Rash     Past Medical History:  Diagnosis Date   Arthritis    BPH (benign prostatic hyperplasia)    Chronic back pain    Family history of adverse reaction to anesthesia    father died during carotid artery surgery   GERD (gastroesophageal reflux disease)    Hyperlipidemia    Hypertension    Pneumonia    Spinal stenosis     Past Surgical History:  Procedure Laterality Date   COLONOSCOPY     FINGER SURGERY     to remove a tumor from right little finger   LAMINECTOMY WITH POSTERIOR LATERAL  ARTHRODESIS LEVEL 2 N/A 08/01/2018   Procedure: LUMBAR THREE- LUMBAR FOUR, LUMBAR FOUR-LUMBAR FIVE DECOMPRESSION, LUMBAR THREE-LUMBAR FOUR, LUMBAR FOUR-LUMBAR FIVE POSTEROLATERAL ARTHRODESIS;  Surgeon: Jovita Gamma, MD;  Location: [place];  Service: Neurosurgery;  Laterality: N/A;    Social History   Socioeconomic History   Marital status: Married    Spouse name: Not on file   Number of children: 2   Years of education: Not on file   Highest education level: Not on file  Occupational History   Not on file  Tobacco Use   Smoking status: Former   Smokeless tobacco: Never  Vaping Use   Vaping Use: Never used  Substance and Sexual Activity   Alcohol use: No    Comment: Occasional   Drug use: No   Sexual activity: Not on file  Other Topics Concern   Not on file  Social History Narrative   Not on file   Social Determinants of Health   Financial Resource Strain: Not on file  Food Insecurity: Not on file  Transportation Needs: Not on file  Physical Activity: Not on file  Stress: Not on file  Social Connections: Not on file  Intimate Partner Violence: Not on file    Family History  Problem Relation Age of Onset   Hypertension Mother     ROS: no fevers or chills, productive cough, hemoptysis, dysphasia, odynophagia,  melena, hematochezia, dysuria, hematuria, rash, seizure activity, orthopnea, PND, pedal edema, claudication. Remaining systems are negative.  Physical Exam:   Blood pressure 132/84, pulse (!) 54, height 5\' 10"  (1.778 m), weight 218 lb 3.2 oz (99 kg), SpO2 98 %.  General:  Well developed/well nourished in NAD Skin warm/dry Patient not depressed No peripheral clubbing Back-normal HEENT-normal/normal eyelids Neck supple/normal carotid upstroke bilaterally; no bruits; no JVD; no thyromegaly chest - CTA/ normal expansion CV - RRR/normal S1 and S2; no murmurs, rubs or gallops;  PMI nondisplaced Abdomen -NT/ND, no HSM, no mass, + bowel sounds, no bruit 2+  femoral pulses, no bruits Ext-no edema, chords, 2+ DP Neuro-grossly nonfocal  ECG -sinus bradycardia at a rate of 54, no ST changes.  Personally reviewed  A/P  1 coronary artery disease-calcium score is elevated.  He describes some dyspnea on exertion and fatigue with activities that is unusual for him.  I will arrange a cardiac CTA to exclude obstructive coronary disease.  Continue aspirin.  We will add Crestor to see if he tolerates.  2 hypertension-blood pressure is controlled.  Continue present medications and follow.  3 hyperlipidemia-he has had myalgias with Lipitor previously.  I will try Crestor 20 mg daily.  Check lipids and liver in 8 weeks.  If he does not tolerate we will try Repatha or Praluent.  4 bruit-schedule abdominal ultrasound to exclude aneurysm.  Kirk Ruths, MD

## 2021-08-04 ENCOUNTER — Other Ambulatory Visit: Payer: Self-pay

## 2021-08-04 ENCOUNTER — Encounter: Payer: Self-pay | Admitting: Cardiology

## 2021-08-04 ENCOUNTER — Ambulatory Visit: Payer: Medicare HMO | Admitting: Cardiology

## 2021-08-04 VITALS — BP 132/84 | HR 54 | Ht 70.0 in | Wt 218.2 lb

## 2021-08-04 DIAGNOSIS — I10 Essential (primary) hypertension: Secondary | ICD-10-CM

## 2021-08-04 DIAGNOSIS — E7800 Pure hypercholesterolemia, unspecified: Secondary | ICD-10-CM | POA: Diagnosis not present

## 2021-08-04 DIAGNOSIS — R931 Abnormal findings on diagnostic imaging of heart and coronary circulation: Secondary | ICD-10-CM | POA: Diagnosis not present

## 2021-08-04 DIAGNOSIS — R0989 Other specified symptoms and signs involving the circulatory and respiratory systems: Secondary | ICD-10-CM | POA: Diagnosis not present

## 2021-08-04 MED ORDER — METOPROLOL TARTRATE 25 MG PO TABS: ORAL_TABLET | ORAL | 0 refills | Status: DC

## 2021-08-04 MED ORDER — ROSUVASTATIN CALCIUM 20 MG PO TABS: 20.0000 mg | ORAL_TABLET | Freq: Every day | ORAL | 3 refills | Status: DC

## 2021-08-04 NOTE — Patient Instructions (Signed)
Medication Instructions:   START ROSUVASTATIN 20 MG ONCE DAILY  *If you need a refill on your cardiac medications before your next appointment, please call your pharmacy*   Lab Work:  Your physician recommends that you return for lab work in: [place]  If you have labs (blood work) drawn today and your tests are completely normal, you will receive your results only by: [place] (if you have MyChart) OR A paper copy in the mail If you have any lab test that is abnormal or we need to change your treatment, we will call you to review the results.   Testing/Procedures:  Your cardiac CT will be scheduled at  [facility], [place] [address] [phone]  If scheduled at [facility], please arrive at the [facility] main entrance (entrance A) of [facility] 30 minutes prior to test start time. You can use the FREE valet parking offered at the main entrance (encouraged to control the heart rate for the test) Proceed to the [facility] Radiology Department (first floor) to check-in and test prep.   Please follow these instructions carefully (unless otherwise directed):  Hold all erectile dysfunction medications at least 3 days (72 hrs) prior to test.  On the Night Before the Test: Be sure to Drink plenty of water. Do not consume any caffeinated/decaffeinated beverages or chocolate 12 hours prior to your test. Do not take any antihistamines 12 hours prior to your test.   On the Day of the Test: Drink plenty of water until 1 hour prior to the test. Do not eat any food 4 hours prior to the test. You may take your regular medications prior to the test.  Take metoprolol (Lopressor) 25 MG two hours prior to test. HOLD Furosemide/Hydrochlorothiazide morning of the test.       After the Test: Drink plenty of water. After receiving IV contrast, you may experience a mild flushed feeling. This is normal. On occasion, you  may experience a mild rash up to 24 hours after the test. This is not dangerous. If this occurs, you can take Benadryl 25 mg and increase your fluid intake. If you experience trouble breathing, this can be serious. If it is severe call 911 IMMEDIATELY. If it is mild, please call our office. If you take any of these medications: Glipizide/Metformin, Avandament, Glucavance, please do not take 48 hours after completing test unless otherwise instructed.  Please allow 2-4 weeks for scheduling of routine cardiac CTs. Some insurance companies require a pre-authorization which may delay scheduling of this test.   For non-scheduling related questions, please contact the cardiac imaging nurse navigator should you have any questions/concerns: Marchia Bond, Cardiac Imaging Nurse Navigator Gordy Clement, Cardiac Imaging Nurse Navigator [facility] Direct Office Dial: [phone]   For scheduling needs, including cancellations and rescheduling, please call [place], [phone].   Your physician has requested that you have an abdominal aorta duplex. During this test, an ultrasound is used to evaluate the aorta. Allow 30 minutes for this exam. Do not eat after midnight the day before and avoid carbonated beverages [facility]  Follow-Up: At [facility], you and your health needs are our priority.  As part of our continuing mission to provide you with exceptional heart care, we have created designated Provider Care Teams.  These Care Teams include your primary Cardiologist (physician) and Advanced Practice Providers (APPs -  Physician Assistants and Nurse Practitioners) who all work together to  provide you with the care you need, when you need it.  We recommend signing up for the patient portal called "MyChart".  Sign up information is provided on this After Visit Summary.  MyChart is used to connect with patients for Virtual Visits (Telemedicine).  Patients are able to view  lab/test results, encounter notes, upcoming appointments, etc.  Non-urgent messages can be sent to your provider as well.   To learn more about what you can do with MyChart, go to [email or web address].    Your next appointment:   12 month(s)  The format for your next appointment:   In Person  Provider:   Kirk Ruths MD

## 2021-08-10 ENCOUNTER — Other Ambulatory Visit (HOSPITAL_COMMUNITY): Payer: Self-pay | Admitting: *Deleted

## 2021-08-10 DIAGNOSIS — M5137 Other intervertebral disc degeneration, lumbosacral region: Secondary | ICD-10-CM | POA: Diagnosis not present

## 2021-08-10 DIAGNOSIS — M9904 Segmental and somatic dysfunction of sacral region: Secondary | ICD-10-CM | POA: Diagnosis not present

## 2021-08-10 DIAGNOSIS — M5032 Other cervical disc degeneration, mid-cervical region, unspecified level: Secondary | ICD-10-CM | POA: Diagnosis not present

## 2021-08-10 DIAGNOSIS — M5136 Other intervertebral disc degeneration, lumbar region: Secondary | ICD-10-CM | POA: Diagnosis not present

## 2021-08-10 DIAGNOSIS — M9901 Segmental and somatic dysfunction of cervical region: Secondary | ICD-10-CM | POA: Diagnosis not present

## 2021-08-10 DIAGNOSIS — M9903 Segmental and somatic dysfunction of lumbar region: Secondary | ICD-10-CM | POA: Diagnosis not present

## 2021-08-10 DIAGNOSIS — Z01812 Encounter for preprocedural laboratory examination: Secondary | ICD-10-CM

## 2021-08-12 ENCOUNTER — Telehealth (HOSPITAL_COMMUNITY): Payer: Self-pay | Admitting: Emergency Medicine

## 2021-08-12 NOTE — Telephone Encounter (Signed)
Reaching out to patient to offer assistance regarding upcoming cardiac imaging study; pt verbalizes understanding of appt date/time, parking situation and where to check in, pre-test NPO status and medications ordered, and verified current allergies; name and call back number provided for further questions should they arise Ruben Bond RN Navigator Cardiac Imaging [facility] [phone] office [phone] cell   Resting HR 50-60s, has 25mg  metop just in case Denies iv issues Arrival time 8:30a

## 2021-08-13 DIAGNOSIS — I10 Essential (primary) hypertension: Secondary | ICD-10-CM | POA: Diagnosis not present

## 2021-08-13 DIAGNOSIS — R931 Abnormal findings on diagnostic imaging of heart and coronary circulation: Secondary | ICD-10-CM | POA: Diagnosis not present

## 2021-08-13 DIAGNOSIS — Z01812 Encounter for preprocedural laboratory examination: Secondary | ICD-10-CM | POA: Diagnosis not present

## 2021-08-13 DIAGNOSIS — R0989 Other specified symptoms and signs involving the circulatory and respiratory systems: Secondary | ICD-10-CM | POA: Diagnosis not present

## 2021-08-13 DIAGNOSIS — E7800 Pure hypercholesterolemia, unspecified: Secondary | ICD-10-CM | POA: Diagnosis not present

## 2021-08-13 LAB — LIPID PANEL
Chol/HDL Ratio: 3.1 ratio (ref 0.0–5.0)
Cholesterol, Total: 133 mg/dL (ref 100–199)
HDL: 43 mg/dL (ref >39)
LDL Chol Calc (NIH): 69 mg/dL (ref 0–99)
Triglycerides: 113 mg/dL (ref 0–149)
VLDL Cholesterol Cal: 21 mg/dL (ref 5–40)

## 2021-08-13 LAB — HEPATIC FUNCTION PANEL
ALT: 24 IU/L (ref 0–44)
AST: 22 IU/L (ref 0–40)
Albumin: 4.3 g/dL (ref 3.7–4.7)
Alkaline Phosphatase: 40 IU/L — ABNORMAL LOW (ref 44–121)
Bilirubin Total: 0.5 mg/dL (ref 0.0–1.2)
Bilirubin, Direct: 0.17 mg/dL (ref 0.00–0.40)
Total Protein: 6.5 g/dL (ref 6.0–8.5)

## 2021-08-14 LAB — BASIC METABOLIC PANEL
BUN/Creatinine Ratio: 17 (ref 10–24)
BUN: 19 mg/dL (ref 8–27)
CO2: 25 mmol/L (ref 20–29)
Calcium: 9.4 mg/dL (ref 8.6–10.2)
Chloride: 102 mmol/L (ref 96–106)
Creatinine, Ser: 1.09 mg/dL (ref 0.76–1.27)
Glucose: 111 mg/dL — ABNORMAL HIGH (ref 70–99)
Potassium: 4.1 mmol/L (ref 3.5–5.2)
Sodium: 142 mmol/L (ref 134–144)
eGFR: 71 mL/min/{1.73_m2} (ref >59)

## 2021-08-16 ENCOUNTER — Other Ambulatory Visit: Payer: Self-pay | Admitting: Cardiovascular Disease

## 2021-08-16 ENCOUNTER — Other Ambulatory Visit: Payer: Self-pay

## 2021-08-16 ENCOUNTER — Ambulatory Visit (HOSPITAL_COMMUNITY)
Admission: RE | Admit: 2021-08-16 | Discharge: 2021-08-16 | Disposition: A | Discharge status: Home / Self Care | Payer: Medicare HMO | Source: Ambulatory Visit | Attending: Cardiology | Admitting: Cardiology

## 2021-08-16 ENCOUNTER — Ambulatory Visit (HOSPITAL_COMMUNITY)
Admission: RE | Admit: 2021-08-16 | Discharge: 2021-08-16 | Disposition: A | Discharge status: Home / Self Care | Payer: Medicare HMO | Source: Ambulatory Visit | Attending: Cardiovascular Disease | Admitting: Cardiovascular Disease

## 2021-08-16 DIAGNOSIS — R072 Precordial pain: Secondary | ICD-10-CM

## 2021-08-16 DIAGNOSIS — R931 Abnormal findings on diagnostic imaging of heart and coronary circulation: Secondary | ICD-10-CM

## 2021-08-16 DIAGNOSIS — I2510 Atherosclerotic heart disease of native coronary artery without angina pectoris: Secondary | ICD-10-CM | POA: Diagnosis not present

## 2021-08-16 MED ORDER — NITROGLYCERIN 0.4 MG SL SUBL
SUBLINGUAL_TABLET | SUBLINGUAL | Status: AC
  Filled 2021-08-16: qty 2

## 2021-08-16 MED ORDER — NITROGLYCERIN 0.4 MG SL SUBL
0.8000 mg | SUBLINGUAL_TABLET | Freq: Once | SUBLINGUAL | Status: AC
  Administered 2021-08-16: 0.8 mg via SUBLINGUAL

## 2021-08-16 MED ORDER — IOHEXOL 350 MG/ML SOLN
100.0000 mL | Freq: Once | INTRAVENOUS | Status: AC | PRN
  Administered 2021-08-16: 100 mL via INTRAVENOUS

## 2021-08-16 NOTE — Progress Notes (Signed)
CT FFR ordered for abnormal CTA.  [place] T. Audie Box, MD, [place]  [address], [place], [place] [address] [phone]  11:35 AM

## 2021-08-17 DIAGNOSIS — R931 Abnormal findings on diagnostic imaging of heart and coronary circulation: Secondary | ICD-10-CM | POA: Diagnosis not present

## 2021-08-17 DIAGNOSIS — I2510 Atherosclerotic heart disease of native coronary artery without angina pectoris: Secondary | ICD-10-CM | POA: Diagnosis not present

## 2021-08-23 ENCOUNTER — Telehealth: Payer: Self-pay | Admitting: Cardiology

## 2021-08-23 ENCOUNTER — Ambulatory Visit: Payer: Medicare HMO | Admitting: Cardiology

## 2021-08-23 NOTE — Telephone Encounter (Signed)
Returned call to patient, advised patient that I will forward message to Dr. Jacalyn Lefevre nurse to see where he can be worked in. Patient grateful for return call and verbalized understanding.

## 2021-08-23 NOTE — Telephone Encounter (Signed)
Spoke with pt, Follow up scheduled  

## 2021-08-23 NOTE — Telephone Encounter (Signed)
Patient requesting a call back from [place]. Called patient to reschedule appointment due to provider being out sick today. Patient refuses to see an APP and states he needs a appointment soon. I did not see anything with Dr. Stanford Breed before the end of March.

## 2021-08-24 ENCOUNTER — Encounter: Payer: Self-pay | Admitting: Cardiology

## 2021-08-24 ENCOUNTER — Ambulatory Visit: Payer: Medicare HMO | Admitting: Cardiology

## 2021-08-24 ENCOUNTER — Other Ambulatory Visit: Payer: Self-pay

## 2021-08-24 VITALS — BP 140/78 | HR 60 | Ht 69.0 in | Wt 218.2 lb

## 2021-08-24 DIAGNOSIS — I10 Essential (primary) hypertension: Secondary | ICD-10-CM | POA: Diagnosis not present

## 2021-08-24 DIAGNOSIS — R931 Abnormal findings on diagnostic imaging of heart and coronary circulation: Secondary | ICD-10-CM | POA: Diagnosis not present

## 2021-08-24 DIAGNOSIS — E7800 Pure hypercholesterolemia, unspecified: Secondary | ICD-10-CM

## 2021-08-24 DIAGNOSIS — I2510 Atherosclerotic heart disease of native coronary artery without angina pectoris: Secondary | ICD-10-CM | POA: Diagnosis not present

## 2021-08-24 LAB — CBC
Hematocrit: 45.5 % (ref 37.5–51.0)
Hemoglobin: 14.9 g/dL (ref 13.0–17.7)
MCH: 26.5 pg — ABNORMAL LOW (ref 26.6–33.0)
MCHC: 32.7 g/dL (ref 31.5–35.7)
MCV: 81 fL (ref 79–97)
Platelets: 185 10*3/uL (ref 150–450)
RBC: 5.63 x10E6/uL (ref 4.14–5.80)
RDW: 14.4 % (ref 11.6–15.4)
WBC: 7.8 10*3/uL (ref 3.4–10.8)

## 2021-08-24 MED ORDER — SODIUM CHLORIDE 0.9% FLUSH: 3.0000 mL | Freq: Two times a day (BID) | INTRAVENOUS | Status: DC

## 2021-08-24 NOTE — Patient Instructions (Signed)
  [place] [address] Dept: [phone] Loc: [facility]  08/24/2021  You are scheduled for a Cardiac Catheterization on Friday, December 2 with Dr. Sherren Mocha.  1. Please arrive at the [place] (Main Entrance A) at [facility]: [address] [place], [place] [address] at 8:30 AM (This time is two hours before your procedure to ensure your preparation). Free valet parking service is available.   Special note: Every effort is made to have your procedure done on time. Please understand that emergencies sometimes delay scheduled procedures.  2. Diet: Do not eat solid foods after midnight.  The patient may have clear liquids until 5am upon the day of the procedure.  3. Labs: You will need to have blood drawn on TODAY  4. Medication instructions in preparation for your procedure:   On the morning of your procedure, take your Aspirin and any morning medicines NOT listed above.  You may use sips of water.  5. Plan for one night stay--bring personal belongings. 6. Bring a current list of your medications and current insurance cards. 7. You MUST have a responsible person to drive you home. 8. Someone MUST be with you the first 24 hours after you arrive home or your discharge will be delayed. 9. Please wear clothes that are easy to get on and off and wear slip-on shoes.  Thank you for allowing [place] to care for you!   -- [facility]   Your physician recommends that you schedule a follow-up appointment in: [place]

## 2021-08-24 NOTE — Progress Notes (Signed)
HPI: FU CAD. Renal Dopplers November 2018 showed no renal artery stenosis. Calcium score September 2022 561 which was 68 percentile.  Also with aortic atherosclerosis.  At last office visit patient was complaining of dyspnea and we arranged cardiac CTA (08/16/21).  Calcium score 429, severe stenosis in the mid LAD, mild to moderate mid right coronary artery disease at 40 to 50%, moderate disease in the posterior lateral at 50 to 69%.  LAD lesion significant by FFR.  Since last seen, he continues to have dyspnea and fatigue with more vigorous activities.  He denies chest pain.  No orthopnea, PND or pedal edema.  Current Outpatient Medications  Medication Sig Dispense Refill   amLODipine (NORVASC) 10 MG tablet Take 10 mg by mouth daily.     aspirin 81 MG tablet Take 81 mg by mouth daily.     bisoprolol-hydrochlorothiazide (ZIAC) 10-6.25 MG per tablet Take 1 tablet by mouth daily.     finasteride (PROSCAR) 5 MG tablet Take 5 mg by mouth daily.     fluocinonide cream (LIDEX) 4.74 % Apply 1 application topically daily as needed (itchy ears).  (Patient not taking: Reported on 08/04/2021)     rosuvastatin (CRESTOR) 20 MG tablet Take 1 tablet (20 mg total) by mouth daily. 90 tablet 3   No current facility-administered medications for this visit.     Past Medical History:  Diagnosis Date   Arthritis    BPH (benign prostatic hyperplasia)    Chronic back pain    Family history of adverse reaction to anesthesia    father died during carotid artery surgery   GERD (gastroesophageal reflux disease)    Hyperlipidemia    Hypertension    Pneumonia    Spinal stenosis     Past Surgical History:  Procedure Laterality Date   COLONOSCOPY     FINGER SURGERY     to remove a tumor from right little finger   LAMINECTOMY WITH POSTERIOR LATERAL ARTHRODESIS LEVEL 2 N/A 08/01/2018   Procedure: LUMBAR THREE- LUMBAR FOUR, LUMBAR FOUR-LUMBAR FIVE DECOMPRESSION, LUMBAR THREE-LUMBAR FOUR, LUMBAR FOUR-LUMBAR  FIVE POSTEROLATERAL ARTHRODESIS;  Surgeon: Jovita Gamma, MD;  Location: [facility];  Service: Neurosurgery;  Laterality: N/A;    Social History   Socioeconomic History   Marital status: Married    Spouse name: Not on file   Number of children: 2   Years of education: Not on file   Highest education level: Not on file  Occupational History   Not on file  Tobacco Use   Smoking status: Former   Smokeless tobacco: Never  Vaping Use   Vaping Use: Never used  Substance and Sexual Activity   Alcohol use: No    Comment: Occasional   Drug use: No   Sexual activity: Not on file  Other Topics Concern   Not on file  Social History Narrative   Not on file   Social Determinants of Health   Financial Resource Strain: Not on file  Food Insecurity: Not on file  Transportation Needs: Not on file  Physical Activity: Not on file  Stress: Not on file  Social Connections: Not on file  Intimate Partner Violence: Not on file    Family History  Problem Relation Age of Onset   Hypertension Mother     ROS: no fevers or chills, productive cough, hemoptysis, dysphasia, odynophagia, melena, hematochezia, dysuria, hematuria, rash, seizure activity, orthopnea, PND, pedal edema, claudication. Remaining systems are negative.  Physical Exam: Well-developed well-nourished in no  acute distress.  Skin is warm and dry.  HEENT is normal.  Neck is supple.  Chest is clear to auscultation with normal expansion.  Cardiovascular exam is regular rate and rhythm.  Abdominal exam nontender or distended. No masses palpated. Extremities show no edema. neuro grossly intact  A/P  1 coronary artery disease-continue aspirin and statin.  He has significant LAD disease on CTA and FFR was abnormal.  He has fatigue/dyspnea with more vigorous activities which is new and certainly this may be contributing.  We discussed options today and we will plan to proceed with cardiac catheterization.  The risk and benefits  including myocardial infarction, CVA and death discussed and he agrees to proceed.  2 abdominal bruit-abdominal ultrasound is pending.  3 hypertension-blood pressure controlled.  Continue present medical regimen.  4 hyperlipidemia-patient appears to be tolerating Crestor at this point.  We will continue at present dose.  Kirk Ruths, MD

## 2021-08-24 NOTE — H&P (View-Only) (Signed)
HPI: FU CAD. Renal Dopplers November 2018 showed no renal artery stenosis. Calcium score September 2022 561 which was 68 percentile.  Also with aortic atherosclerosis.  At last office visit patient was complaining of dyspnea and we arranged cardiac CTA (08/16/21).  Calcium score 429, severe stenosis in the mid LAD, mild to moderate mid right coronary artery disease at 40 to 50%, moderate disease in the posterior lateral at 50 to 69%.  LAD lesion significant by FFR.  Since last seen, he continues to have dyspnea and fatigue with more vigorous activities.  He denies chest pain.  No orthopnea, PND or pedal edema.  Current Outpatient Medications  Medication Sig Dispense Refill   amLODipine (NORVASC) 10 MG tablet Take 10 mg by mouth daily.     aspirin 81 MG tablet Take 81 mg by mouth daily.     bisoprolol-hydrochlorothiazide (ZIAC) 10-6.25 MG per tablet Take 1 tablet by mouth daily.     finasteride (PROSCAR) 5 MG tablet Take 5 mg by mouth daily.     fluocinonide cream (LIDEX) 6.62 % Apply 1 application topically daily as needed (itchy ears).  (Patient not taking: Reported on 08/04/2021)     rosuvastatin (CRESTOR) 20 MG tablet Take 1 tablet (20 mg total) by mouth daily. 90 tablet 3   No current facility-administered medications for this visit.     Past Medical History:  Diagnosis Date   Arthritis    BPH (benign prostatic hyperplasia)    Chronic back pain    Family history of adverse reaction to anesthesia    father died during carotid artery surgery   GERD (gastroesophageal reflux disease)    Hyperlipidemia    Hypertension    Pneumonia    Spinal stenosis     Past Surgical History:  Procedure Laterality Date   COLONOSCOPY     FINGER SURGERY     to remove a tumor from right little finger   LAMINECTOMY WITH POSTERIOR LATERAL ARTHRODESIS LEVEL 2 N/A 08/01/2018   Procedure: LUMBAR THREE- LUMBAR FOUR, LUMBAR FOUR-LUMBAR FIVE DECOMPRESSION, LUMBAR THREE-LUMBAR FOUR, LUMBAR FOUR-LUMBAR  FIVE POSTEROLATERAL ARTHRODESIS;  Surgeon: Jovita Gamma, MD;  Location: [facility];  Service: Neurosurgery;  Laterality: N/A;    Social History   Socioeconomic History   Marital status: Married    Spouse name: Not on file   Number of children: 2   Years of education: Not on file   Highest education level: Not on file  Occupational History   Not on file  Tobacco Use   Smoking status: Former   Smokeless tobacco: Never  Vaping Use   Vaping Use: Never used  Substance and Sexual Activity   Alcohol use: No    Comment: Occasional   Drug use: No   Sexual activity: Not on file  Other Topics Concern   Not on file  Social History Narrative   Not on file   Social Determinants of Health   Financial Resource Strain: Not on file  Food Insecurity: Not on file  Transportation Needs: Not on file  Physical Activity: Not on file  Stress: Not on file  Social Connections: Not on file  Intimate Partner Violence: Not on file    Family History  Problem Relation Age of Onset   Hypertension Mother     ROS: no fevers or chills, productive cough, hemoptysis, dysphasia, odynophagia, melena, hematochezia, dysuria, hematuria, rash, seizure activity, orthopnea, PND, pedal edema, claudication. Remaining systems are negative.  Physical Exam: Well-developed well-nourished in no  acute distress.  Skin is warm and dry.  HEENT is normal.  Neck is supple.  Chest is clear to auscultation with normal expansion.  Cardiovascular exam is regular rate and rhythm.  Abdominal exam nontender or distended. No masses palpated. Extremities show no edema. neuro grossly intact  A/P  1 coronary artery disease-continue aspirin and statin.  He has significant LAD disease on CTA and FFR was abnormal.  He has fatigue/dyspnea with more vigorous activities which is new and certainly this may be contributing.  We discussed options today and we will plan to proceed with cardiac catheterization.  The risk and benefits  including myocardial infarction, CVA and death discussed and he agrees to proceed.  2 abdominal bruit-abdominal ultrasound is pending.  3 hypertension-blood pressure controlled.  Continue present medical regimen.  4 hyperlipidemia-patient appears to be tolerating Crestor at this point.  We will continue at present dose.  Kirk Ruths, MD

## 2021-08-26 ENCOUNTER — Ambulatory Visit (HOSPITAL_COMMUNITY)
Admission: RE | Admit: 2021-08-26 | Discharge: 2021-08-26 | Disposition: A | Discharge status: Home / Self Care | Payer: Medicare HMO | Source: Ambulatory Visit | Attending: Cardiology | Admitting: Cardiology

## 2021-08-26 ENCOUNTER — Other Ambulatory Visit: Payer: Self-pay

## 2021-08-26 ENCOUNTER — Telehealth: Payer: Self-pay | Admitting: *Deleted

## 2021-08-26 DIAGNOSIS — Z136 Encounter for screening for cardiovascular disorders: Secondary | ICD-10-CM | POA: Insufficient documentation

## 2021-08-26 DIAGNOSIS — R69 Illness, unspecified: Secondary | ICD-10-CM | POA: Diagnosis not present

## 2021-08-26 DIAGNOSIS — F17211 Nicotine dependence, cigarettes, in remission: Secondary | ICD-10-CM | POA: Diagnosis not present

## 2021-08-26 DIAGNOSIS — I10 Essential (primary) hypertension: Secondary | ICD-10-CM | POA: Insufficient documentation

## 2021-08-26 DIAGNOSIS — R0989 Other specified symptoms and signs involving the circulatory and respiratory systems: Secondary | ICD-10-CM | POA: Diagnosis not present

## 2021-08-26 DIAGNOSIS — E669 Obesity, unspecified: Secondary | ICD-10-CM | POA: Insufficient documentation

## 2021-08-26 DIAGNOSIS — Z87891 Personal history of nicotine dependence: Secondary | ICD-10-CM | POA: Insufficient documentation

## 2021-08-26 DIAGNOSIS — E785 Hyperlipidemia, unspecified: Secondary | ICD-10-CM | POA: Diagnosis not present

## 2021-08-26 NOTE — Telephone Encounter (Signed)
Cardiac catheterization scheduled at [facility] for: Friday August 27, 2021 10:30 AM [place] Entrance A [facility]) at: 8:30 AM   Diet-no solid food after midnight prior to cath, clear liquids until 5 AM day of procedure.  Bring a list of your medications and the last time medications have been taken.  Medication instructions for procedure: -Hold: Bisoprolol/HCTZ-AM of procedure  -Except hold medications usual morning medications can be taken pre-cath with sips of water including aspirin 81 mg.    Confirmed patient has responsible adult to drive home post procedure and be with patient first 24 hours after arriving home.  [facility] does allow one visitor to accompany you and wait in the hospital waiting room while you are there for your procedure. You and your visitor will be asked to wear a mask once you enter the hospital.   Patient reports does not currently have any new symptoms concerning for COVID-19 and no household members with COVID-19 like illness.    Reviewed procedure/mask/visitor instructions with patient.

## 2021-08-27 ENCOUNTER — Encounter (HOSPITAL_COMMUNITY)
Admission: RE | Disposition: A | Discharge status: Home / Self Care | Payer: Self-pay | Source: Home / Self Care | Attending: Cardiovascular Disease

## 2021-08-27 ENCOUNTER — Other Ambulatory Visit: Payer: Self-pay

## 2021-08-27 ENCOUNTER — Ambulatory Visit (HOSPITAL_COMMUNITY)
Admission: RE | Admit: 2021-08-27 | Discharge: 2021-08-27 | Disposition: A | Discharge status: Home / Self Care | Payer: Medicare HMO | Attending: Cardiovascular Disease | Admitting: Cardiovascular Disease

## 2021-08-27 DIAGNOSIS — I2510 Atherosclerotic heart disease of native coronary artery without angina pectoris: Secondary | ICD-10-CM | POA: Insufficient documentation

## 2021-08-27 DIAGNOSIS — R931 Abnormal findings on diagnostic imaging of heart and coronary circulation: Secondary | ICD-10-CM | POA: Diagnosis present

## 2021-08-27 DIAGNOSIS — I10 Essential (primary) hypertension: Secondary | ICD-10-CM | POA: Diagnosis not present

## 2021-08-27 DIAGNOSIS — E785 Hyperlipidemia, unspecified: Secondary | ICD-10-CM | POA: Diagnosis not present

## 2021-08-27 DIAGNOSIS — I700 Atherosclerosis of aorta: Secondary | ICD-10-CM | POA: Insufficient documentation

## 2021-08-27 HISTORY — PX: LEFT HEART CATH AND CORONARY ANGIOGRAPHY: CATH118249

## 2021-08-27 SURGERY — LEFT HEART CATH AND CORONARY ANGIOGRAPHY
Anesthesia: LOCAL

## 2021-08-27 MED ORDER — MIDAZOLAM HCL 2 MG/2ML IJ SOLN
INTRAMUSCULAR | Status: DC | PRN
  Administered 2021-08-27: 2 mg via INTRAVENOUS

## 2021-08-27 MED ORDER — LIDOCAINE HCL (PF) 1 % IJ SOLN
INTRAMUSCULAR | Status: AC
  Filled 2021-08-27: qty 30

## 2021-08-27 MED ORDER — MIDAZOLAM HCL 2 MG/2ML IJ SOLN
INTRAMUSCULAR | Status: AC
  Filled 2021-08-27: qty 2

## 2021-08-27 MED ORDER — SODIUM CHLORIDE 0.9 % IV SOLN: 250.0000 mL | INTRAVENOUS | Status: DC | PRN

## 2021-08-27 MED ORDER — HEPARIN SODIUM (PORCINE) 1000 UNIT/ML IJ SOLN
INTRAMUSCULAR | Status: DC | PRN
  Administered 2021-08-27: 5000 [IU] via INTRAVENOUS

## 2021-08-27 MED ORDER — HEPARIN (PORCINE) IN NACL 1000-0.9 UT/500ML-% IV SOLN
INTRAVENOUS | Status: AC
  Filled 2021-08-27: qty 500

## 2021-08-27 MED ORDER — FENTANYL CITRATE (PF) 100 MCG/2ML IJ SOLN
INTRAMUSCULAR | Status: AC
  Filled 2021-08-27: qty 2

## 2021-08-27 MED ORDER — HEPARIN (PORCINE) IN NACL 1000-0.9 UT/500ML-% IV SOLN
INTRAVENOUS | Status: DC | PRN
  Administered 2021-08-27 (×2): 500 mL

## 2021-08-27 MED ORDER — VERAPAMIL HCL 2.5 MG/ML IV SOLN
INTRAVENOUS | Status: AC
  Filled 2021-08-27: qty 2

## 2021-08-27 MED ORDER — HEPARIN SODIUM (PORCINE) 1000 UNIT/ML IJ SOLN
INTRAMUSCULAR | Status: AC
  Filled 2021-08-27: qty 10

## 2021-08-27 MED ORDER — VERAPAMIL HCL 2.5 MG/ML IV SOLN
INTRAVENOUS | Status: DC | PRN
  Administered 2021-08-27: 10 mL via INTRA_ARTERIAL

## 2021-08-27 MED ORDER — NITROGLYCERIN 1 MG/10 ML FOR IR/CATH LAB
INTRA_ARTERIAL | Status: AC
  Filled 2021-08-27: qty 10

## 2021-08-27 MED ORDER — ASPIRIN 81 MG PO CHEW: 81.0000 mg | CHEWABLE_TABLET | ORAL | Status: DC

## 2021-08-27 MED ORDER — IOHEXOL 350 MG/ML SOLN
INTRAVENOUS | Status: DC | PRN
  Administered 2021-08-27: 50 mL

## 2021-08-27 MED ORDER — SODIUM CHLORIDE 0.9 % WEIGHT BASED INFUSION
3.0000 mL/kg/h | INTRAVENOUS | Status: AC
  Administered 2021-08-27: 3 mL/kg/h via INTRAVENOUS

## 2021-08-27 MED ORDER — LIDOCAINE HCL (PF) 1 % IJ SOLN
INTRAMUSCULAR | Status: DC | PRN
  Administered 2021-08-27: 2 mL

## 2021-08-27 MED ORDER — SODIUM CHLORIDE 0.9 % WEIGHT BASED INFUSION: 1.0000 mL/kg/h | INTRAVENOUS | Status: DC

## 2021-08-27 MED ORDER — SODIUM CHLORIDE 0.9% FLUSH: 3.0000 mL | INTRAVENOUS | Status: DC | PRN

## 2021-08-27 MED ORDER — FENTANYL CITRATE (PF) 100 MCG/2ML IJ SOLN
INTRAMUSCULAR | Status: DC | PRN
  Administered 2021-08-27: 25 ug via INTRAVENOUS

## 2021-08-27 SURGICAL SUPPLY — 10 items
CATH 5FR JL3.5 JR4 ANG PIG MP (CATHETERS) ×2 IMPLANT
DEVICE RAD COMP TR BAND LRG (VASCULAR PRODUCTS) ×2 IMPLANT
GLIDESHEATH SLEND SS 6F .021 (SHEATH) ×2 IMPLANT
GUIDEWIRE INQWIRE 1.5J.035X260 (WIRE) ×1 IMPLANT
INQWIRE 1.5J .035X260CM (WIRE) ×2
KIT HEART LEFT (KITS) ×2 IMPLANT
PACK CARDIAC CATHETERIZATION (CUSTOM PROCEDURE TRAY) ×2 IMPLANT
SYR MEDRAD MARK 7 150ML (SYRINGE) ×2 IMPLANT
TRANSDUCER W/STOPCOCK (MISCELLANEOUS) ×2 IMPLANT
TUBING CIL FLEX 10 FLL-RA (TUBING) ×2 IMPLANT

## 2021-08-27 NOTE — Interval H&P Note (Signed)
History and Physical Interval Note:  08/27/2021 12:36 PM  Ruben Rodriguez  has presented today for surgery, with the diagnosis of abnormal ct.  The various methods of treatment have been discussed with the patient and family. After consideration of risks, benefits and other options for treatment, the patient has consented to  Procedure(s): LEFT HEART CATH AND CORONARY ANGIOGRAPHY (N/A) as a surgical intervention.  The patient's history has been reviewed, patient examined, no change in status, stable for surgery.  I have reviewed the patient's chart and labs.  Questions were answered to the patient's satisfaction.     Sherren Mocha

## 2021-08-27 NOTE — Progress Notes (Signed)
Pt ambulated without difficulty or bleeding.   Discharged home with wife who will drive and stay with pt x 24 hrs 

## 2021-08-30 ENCOUNTER — Encounter (HOSPITAL_COMMUNITY): Payer: Self-pay | Admitting: Cardiovascular Disease

## 2021-09-07 DIAGNOSIS — M9903 Segmental and somatic dysfunction of lumbar region: Secondary | ICD-10-CM | POA: Diagnosis not present

## 2021-09-07 DIAGNOSIS — M5137 Other intervertebral disc degeneration, lumbosacral region: Secondary | ICD-10-CM | POA: Diagnosis not present

## 2021-09-07 DIAGNOSIS — M9901 Segmental and somatic dysfunction of cervical region: Secondary | ICD-10-CM | POA: Diagnosis not present

## 2021-09-07 DIAGNOSIS — M5032 Other cervical disc degeneration, mid-cervical region, unspecified level: Secondary | ICD-10-CM | POA: Diagnosis not present

## 2021-09-07 DIAGNOSIS — M5136 Other intervertebral disc degeneration, lumbar region: Secondary | ICD-10-CM | POA: Diagnosis not present

## 2021-09-07 DIAGNOSIS — M9904 Segmental and somatic dysfunction of sacral region: Secondary | ICD-10-CM | POA: Diagnosis not present

## 2021-09-28 DIAGNOSIS — D225 Melanocytic nevi of trunk: Secondary | ICD-10-CM | POA: Diagnosis not present

## 2021-09-28 DIAGNOSIS — C44329 Squamous cell carcinoma of skin of other parts of face: Secondary | ICD-10-CM | POA: Diagnosis not present

## 2021-09-28 DIAGNOSIS — Z85828 Personal history of other malignant neoplasm of skin: Secondary | ICD-10-CM | POA: Diagnosis not present

## 2021-09-28 DIAGNOSIS — D485 Neoplasm of uncertain behavior of skin: Secondary | ICD-10-CM | POA: Diagnosis not present

## 2021-09-28 DIAGNOSIS — L570 Actinic keratosis: Secondary | ICD-10-CM | POA: Diagnosis not present

## 2021-09-28 DIAGNOSIS — L821 Other seborrheic keratosis: Secondary | ICD-10-CM | POA: Diagnosis not present

## 2021-10-04 ENCOUNTER — Other Ambulatory Visit: Payer: Self-pay

## 2021-10-04 DIAGNOSIS — Z79899 Other long term (current) drug therapy: Secondary | ICD-10-CM | POA: Diagnosis not present

## 2021-10-04 DIAGNOSIS — E7800 Pure hypercholesterolemia, unspecified: Secondary | ICD-10-CM

## 2021-10-04 LAB — HEPATIC FUNCTION PANEL
ALT: 16 IU/L (ref 0–44)
AST: 19 IU/L (ref 0–40)
Albumin: 4.2 g/dL (ref 3.7–4.7)
Alkaline Phosphatase: 40 IU/L — ABNORMAL LOW (ref 44–121)
Bilirubin Total: 0.6 mg/dL (ref 0.0–1.2)
Bilirubin, Direct: 0.17 mg/dL (ref 0.00–0.40)
Total Protein: 6.4 g/dL (ref 6.0–8.5)

## 2021-10-04 LAB — LIPID PANEL
Chol/HDL Ratio: 2.7 ratio (ref 0.0–5.0)
Cholesterol, Total: 106 mg/dL (ref 100–199)
HDL: 40 mg/dL (ref >39)
LDL Chol Calc (NIH): 45 mg/dL (ref 0–99)
Triglycerides: 115 mg/dL (ref 0–149)
VLDL Cholesterol Cal: 21 mg/dL (ref 5–40)

## 2021-10-04 NOTE — Progress Notes (Signed)
Orders for repeat blood work placed.

## 2021-10-05 DIAGNOSIS — M5137 Other intervertebral disc degeneration, lumbosacral region: Secondary | ICD-10-CM | POA: Diagnosis not present

## 2021-10-05 DIAGNOSIS — M5136 Other intervertebral disc degeneration, lumbar region: Secondary | ICD-10-CM | POA: Diagnosis not present

## 2021-10-05 DIAGNOSIS — M9901 Segmental and somatic dysfunction of cervical region: Secondary | ICD-10-CM | POA: Diagnosis not present

## 2021-10-05 DIAGNOSIS — M9903 Segmental and somatic dysfunction of lumbar region: Secondary | ICD-10-CM | POA: Diagnosis not present

## 2021-10-05 DIAGNOSIS — M5032 Other cervical disc degeneration, mid-cervical region, unspecified level: Secondary | ICD-10-CM | POA: Diagnosis not present

## 2021-10-05 DIAGNOSIS — M9904 Segmental and somatic dysfunction of sacral region: Secondary | ICD-10-CM | POA: Diagnosis not present

## 2021-10-18 NOTE — Progress Notes (Signed)
HPI: FU CAD. Renal Dopplers November 2018 showed no renal artery stenosis. Calcium score September 2022 561 which was 68 percentile.  Also with aortic atherosclerosis. Cardiac CTA (08/16/21).  Calcium score 429, severe stenosis in the mid LAD, mild to moderate mid right coronary artery disease at 40 to 50%, moderate disease in the posterior lateral at 50 to 69%.  LAD lesion significant by FFR.  Abdominal ultrasound December 2022 showed no aneurysm.  Cardiac catheterization December 2022 showed severe mid LAD lesion of 80% and 90% first posterior lateral with low LVEDP.  Medical therapy recommended.  If he develops symptoms PCI of the LAD could be performed.  Since last seen, he denies dyspnea on exertion, orthopnea, PND, pedal edema, chest pain or syncope.  Current Outpatient Medications  Medication Sig Dispense Refill   amLODipine (NORVASC) 10 MG tablet Take 10 mg by mouth daily.     aspirin EC 81 MG tablet Take 81 mg by mouth at bedtime. Swallow whole.     bisoprolol-hydrochlorothiazide (ZIAC) 10-6.25 MG per tablet Take 1 tablet by mouth daily.     finasteride (PROSCAR) 5 MG tablet Take 5 mg by mouth at bedtime.     Polyethyl Glycol-Propyl Glycol (LUBRICANT EYE DROPS) 0.4-0.3 % SOLN Place 1-2 drops into both eyes 3 (three) times daily as needed (dry/irritated eyes.).     rosuvastatin (CRESTOR) 20 MG tablet Take 1 tablet (20 mg total) by mouth daily. 90 tablet 3   acetaminophen (TYLENOL) 650 MG CR tablet Take 1,300 mg by mouth every 8 (eight) hours as needed for pain. (Patient not taking: Reported on 10/29/2021)     fluocinonide cream (LIDEX) 1.61 % Apply 1 application topically daily as needed (itchy ears). (Patient not taking: Reported on 10/29/2021)     ibuprofen (ADVIL) 200 MG tablet Take 400 mg by mouth 2 (two) times daily as needed (pain.). (Patient not taking: Reported on 10/29/2021)     Current Facility-Administered Medications  Medication Dose Route Frequency Provider Last Rate Last  Admin   sodium chloride flush (NS) 0.9 % injection 3 mL  3 mL Intravenous Q12H Lelon Perla, MD         Past Medical History:  Diagnosis Date   Arthritis    BPH (benign prostatic hyperplasia)    Chronic back pain    Family history of adverse reaction to anesthesia    father died during carotid artery surgery   GERD (gastroesophageal reflux disease)    Hyperlipidemia    Hypertension    Pneumonia    Spinal stenosis     Past Surgical History:  Procedure Laterality Date   COLONOSCOPY     FINGER SURGERY     to remove a tumor from right little finger   LAMINECTOMY WITH POSTERIOR LATERAL ARTHRODESIS LEVEL 2 N/A 08/01/2018   Procedure: LUMBAR THREE- LUMBAR FOUR, LUMBAR FOUR-LUMBAR FIVE DECOMPRESSION, LUMBAR THREE-LUMBAR FOUR, LUMBAR FOUR-LUMBAR FIVE POSTEROLATERAL ARTHRODESIS;  Surgeon: Jovita Gamma, MD;  Location: [facility];  Service: Neurosurgery;  Laterality: N/A;   LEFT HEART CATH AND CORONARY ANGIOGRAPHY N/A 08/27/2021   Procedure: LEFT HEART CATH AND CORONARY ANGIOGRAPHY;  Surgeon: Sherren Mocha, MD;  Location: [facility];  Service: Cardiovascular;  Laterality: N/A;    Social History   Socioeconomic History   Marital status: Married    Spouse name: Not on file   Number of children: 2   Years of education: Not on file   Highest education level: Not on file  Occupational History  Not on file  Tobacco Use   Smoking status: Former   Smokeless tobacco: Never  Vaping Use   Vaping Use: Never used  Substance and Sexual Activity   Alcohol use: No    Comment: Occasional   Drug use: No   Sexual activity: Not on file  Other Topics Concern   Not on file  Social History Narrative   Not on file   Social Determinants of Health   Financial Resource Strain: Not on file  Food Insecurity: Not on file  Transportation Needs: Not on file  Physical Activity: Not on file  Stress: Not on file  Social Connections: Not on file  Intimate Partner Violence: Not on file     Family History  Problem Relation Age of Onset   Hypertension Mother     ROS: no fevers or chills, productive cough, hemoptysis, dysphasia, odynophagia, melena, hematochezia, dysuria, hematuria, rash, seizure activity, orthopnea, PND, pedal edema, claudication. Remaining systems are negative.  Physical Exam: Well-developed well-nourished in no acute distress.  Skin is warm and dry.  HEENT is normal.  Neck is supple.  Chest is clear to auscultation with normal expansion.  Cardiovascular exam is regular rate and rhythm.  Abdominal exam nontender or distended. No masses palpated. Extremities show no edema. neuro grossly intact   A/P  1 coronary artery disease-patient denies chest pain.  Plan to continue medical therapy with aspirin and statin.  As outlined above if he develops symptoms PCI of the LAD is feasible.  2 hyperlipidemia-continue Crestor well will increase to 40 mg daily.  Check lipids and liver in 8 weeks.  3 hypertension-patient's blood pressure is controlled.  Continue present medications and follow.  Kirk Ruths, MD

## 2021-10-20 DIAGNOSIS — Z961 Presence of intraocular lens: Secondary | ICD-10-CM | POA: Diagnosis not present

## 2021-10-29 ENCOUNTER — Other Ambulatory Visit: Payer: Self-pay

## 2021-10-29 ENCOUNTER — Encounter: Payer: Self-pay | Admitting: Cardiology

## 2021-10-29 ENCOUNTER — Ambulatory Visit: Payer: Medicare HMO | Admitting: Cardiology

## 2021-10-29 VITALS — BP 118/76 | HR 54 | Ht 69.0 in | Wt 217.0 lb

## 2021-10-29 DIAGNOSIS — I2510 Atherosclerotic heart disease of native coronary artery without angina pectoris: Secondary | ICD-10-CM

## 2021-10-29 DIAGNOSIS — E7800 Pure hypercholesterolemia, unspecified: Secondary | ICD-10-CM

## 2021-10-29 DIAGNOSIS — I10 Essential (primary) hypertension: Secondary | ICD-10-CM

## 2021-10-29 DIAGNOSIS — R931 Abnormal findings on diagnostic imaging of heart and coronary circulation: Secondary | ICD-10-CM | POA: Diagnosis not present

## 2021-10-29 MED ORDER — ROSUVASTATIN CALCIUM 40 MG PO TABS: 40.0000 mg | ORAL_TABLET | Freq: Every day | ORAL | 3 refills | Status: DC

## 2021-10-29 NOTE — Patient Instructions (Signed)
Medication Instructions:   INCREASE ROSUVASTATIN TO 40 MG ONCE DAILY= 2 OF THE 20 MG TABLETS ONCE DAILY  *If you need a refill on your cardiac medications before your next appointment, please call your pharmacy*   Lab Work:  Your physician recommends that you return for lab work in: [place]  If you have labs (blood work) drawn today and your tests are completely normal, you will receive your results only by: [place] (if you have MyChart) OR A paper copy in the mail If you have any lab test that is abnormal or we need to change your treatment, we will call you to review the results.   Follow-Up: At [facility], you and your health needs are our priority.  As part of our continuing mission to provide you with exceptional heart care, we have created designated Provider Care Teams.  These Care Teams include your primary Cardiologist (physician) and Advanced Practice Providers (APPs -  Physician Assistants and Nurse Practitioners) who all work together to provide you with the care you need, when you need it.  We recommend signing up for the patient portal called "MyChart".  Sign up information is provided on this After Visit Summary.  MyChart is used to connect with patients for Virtual Visits (Telemedicine).  Patients are able to view lab/test results, encounter notes, upcoming appointments, etc.  Non-urgent messages can be sent to your provider as well.   To learn more about what you can do with MyChart, go to [email or web address].    Your next appointment:   6 month(s)  The format for your next appointment:   In Person  Provider:   Kirk Ruths MD

## 2021-11-02 DIAGNOSIS — M5137 Other intervertebral disc degeneration, lumbosacral region: Secondary | ICD-10-CM | POA: Diagnosis not present

## 2021-11-02 DIAGNOSIS — M9904 Segmental and somatic dysfunction of sacral region: Secondary | ICD-10-CM | POA: Diagnosis not present

## 2021-11-02 DIAGNOSIS — M5032 Other cervical disc degeneration, mid-cervical region, unspecified level: Secondary | ICD-10-CM | POA: Diagnosis not present

## 2021-11-02 DIAGNOSIS — M9901 Segmental and somatic dysfunction of cervical region: Secondary | ICD-10-CM | POA: Diagnosis not present

## 2021-11-02 DIAGNOSIS — M5136 Other intervertebral disc degeneration, lumbar region: Secondary | ICD-10-CM | POA: Diagnosis not present

## 2021-11-02 DIAGNOSIS — M9903 Segmental and somatic dysfunction of lumbar region: Secondary | ICD-10-CM | POA: Diagnosis not present

## 2021-11-25 DIAGNOSIS — Z125 Encounter for screening for malignant neoplasm of prostate: Secondary | ICD-10-CM | POA: Diagnosis not present

## 2021-11-25 DIAGNOSIS — Z0000 Encounter for general adult medical examination without abnormal findings: Secondary | ICD-10-CM | POA: Diagnosis not present

## 2021-11-25 DIAGNOSIS — N182 Chronic kidney disease, stage 2 (mild): Secondary | ICD-10-CM | POA: Diagnosis not present

## 2021-11-25 DIAGNOSIS — I10 Essential (primary) hypertension: Secondary | ICD-10-CM | POA: Diagnosis not present

## 2021-11-25 DIAGNOSIS — E782 Mixed hyperlipidemia: Secondary | ICD-10-CM | POA: Diagnosis not present

## 2021-11-25 DIAGNOSIS — I2510 Atherosclerotic heart disease of native coronary artery without angina pectoris: Secondary | ICD-10-CM | POA: Diagnosis not present

## 2021-11-25 DIAGNOSIS — I700 Atherosclerosis of aorta: Secondary | ICD-10-CM | POA: Diagnosis not present

## 2021-11-25 DIAGNOSIS — D692 Other nonthrombocytopenic purpura: Secondary | ICD-10-CM | POA: Diagnosis not present

## 2021-11-25 DIAGNOSIS — M4800 Spinal stenosis, site unspecified: Secondary | ICD-10-CM | POA: Diagnosis not present

## 2021-11-25 DIAGNOSIS — N400 Enlarged prostate without lower urinary tract symptoms: Secondary | ICD-10-CM | POA: Diagnosis not present

## 2021-11-25 DIAGNOSIS — R7301 Impaired fasting glucose: Secondary | ICD-10-CM | POA: Diagnosis not present

## 2021-12-28 DIAGNOSIS — M9903 Segmental and somatic dysfunction of lumbar region: Secondary | ICD-10-CM | POA: Diagnosis not present

## 2021-12-28 DIAGNOSIS — M5136 Other intervertebral disc degeneration, lumbar region: Secondary | ICD-10-CM | POA: Diagnosis not present

## 2021-12-28 DIAGNOSIS — M9904 Segmental and somatic dysfunction of sacral region: Secondary | ICD-10-CM | POA: Diagnosis not present

## 2021-12-28 DIAGNOSIS — M9901 Segmental and somatic dysfunction of cervical region: Secondary | ICD-10-CM | POA: Diagnosis not present

## 2021-12-28 DIAGNOSIS — M5032 Other cervical disc degeneration, mid-cervical region, unspecified level: Secondary | ICD-10-CM | POA: Diagnosis not present

## 2021-12-28 DIAGNOSIS — M5137 Other intervertebral disc degeneration, lumbosacral region: Secondary | ICD-10-CM | POA: Diagnosis not present

## 2022-01-03 ENCOUNTER — Encounter: Payer: Self-pay | Admitting: Cardiology

## 2022-01-04 ENCOUNTER — Other Ambulatory Visit: Payer: Self-pay | Admitting: *Deleted

## 2022-01-04 DIAGNOSIS — E7800 Pure hypercholesterolemia, unspecified: Secondary | ICD-10-CM

## 2022-01-20 ENCOUNTER — Telehealth: Payer: Self-pay | Admitting: Pharmacist

## 2022-01-20 ENCOUNTER — Ambulatory Visit: Payer: Medicare HMO | Admitting: Pharmacist

## 2022-01-20 VITALS — BP 110/68 | HR 55 | Resp 15 | Ht 70.0 in | Wt 217.0 lb

## 2022-01-20 DIAGNOSIS — L309 Dermatitis, unspecified: Secondary | ICD-10-CM | POA: Insufficient documentation

## 2022-01-20 DIAGNOSIS — D692 Other nonthrombocytopenic purpura: Secondary | ICD-10-CM | POA: Insufficient documentation

## 2022-01-20 DIAGNOSIS — M4800 Spinal stenosis, site unspecified: Secondary | ICD-10-CM | POA: Insufficient documentation

## 2022-01-20 DIAGNOSIS — I700 Atherosclerosis of aorta: Secondary | ICD-10-CM

## 2022-01-20 DIAGNOSIS — J45909 Unspecified asthma, uncomplicated: Secondary | ICD-10-CM | POA: Insufficient documentation

## 2022-01-20 DIAGNOSIS — E782 Mixed hyperlipidemia: Secondary | ICD-10-CM | POA: Diagnosis not present

## 2022-01-20 DIAGNOSIS — N182 Chronic kidney disease, stage 2 (mild): Secondary | ICD-10-CM | POA: Insufficient documentation

## 2022-01-20 DIAGNOSIS — Z86010 Personal history of colonic polyps: Secondary | ICD-10-CM | POA: Insufficient documentation

## 2022-01-20 DIAGNOSIS — N400 Enlarged prostate without lower urinary tract symptoms: Secondary | ICD-10-CM | POA: Insufficient documentation

## 2022-01-20 DIAGNOSIS — I10 Essential (primary) hypertension: Secondary | ICD-10-CM | POA: Insufficient documentation

## 2022-01-20 DIAGNOSIS — M545 Low back pain, unspecified: Secondary | ICD-10-CM | POA: Insufficient documentation

## 2022-01-20 DIAGNOSIS — E785 Hyperlipidemia, unspecified: Secondary | ICD-10-CM | POA: Insufficient documentation

## 2022-01-20 DIAGNOSIS — I2510 Atherosclerotic heart disease of native coronary artery without angina pectoris: Secondary | ICD-10-CM | POA: Insufficient documentation

## 2022-01-20 DIAGNOSIS — E669 Obesity, unspecified: Secondary | ICD-10-CM | POA: Insufficient documentation

## 2022-01-20 DIAGNOSIS — R7301 Impaired fasting glucose: Secondary | ICD-10-CM | POA: Insufficient documentation

## 2022-01-20 NOTE — Telephone Encounter (Signed)
Please complete prior authorization for: ? ?Name of medication, dose, and frequency repatha '140mg'$  sq q 14 days or Praluent '75mg'$  sq q 14 days ? ?Lab Orders Requested? Lipid panel ? ?Estimated date for labs to be scheduled 2-3 months ? ?Does patient need activated copay card? no  ?

## 2022-01-20 NOTE — Patient Instructions (Signed)
It was nice meeting you today ? ?We would like your LDL (bad cholesterol) to be less than 70 ? ?Since you can not tolerate rosuvastatin we will start a new medication called Praluent ? ?You will use one injection every two weeks ? ?We will complete the prior authorization for you and contact you when it is complete ? ?Once you start the medication we will recheck your cholesterol in 2-3 months ? ?Please call with any questions! ? ?Karren Cobble, PharmD, BCACP, [place], CPP ?[address], [address] ?[place], [place], [address] ?Phone: [phone], Fax: [phone]  ? ?

## 2022-01-20 NOTE — Progress Notes (Signed)
Patient ID: Ruben Rodriguez                 DOB: [birth year]                    MRN: [id number]     HPI: Ruben Rodriguez is a 76 y.o. male patient referred to lipid clinic by Dr Stanford Breed. PMH is significant for CAD, HTN, and elevated coronary calcium score.  Patient is intolerant to statins.  Patient presents today in good spirits with wife. Recently returned from trip in [place].    Was prescribed Crestor '40mg'$  which he was able to tolerate for 2 months until it began causing him severe myalgias.  Medication was effective however as it brought his LDL to goal.  Muscle pain went away within a few days of discontinuing medication.  Current Medications: N/A  Intolerances:  Rosuvastatin Atorvastatin Ezetimibe  Risk Factors:  CAD Elevated coronary calcium score HTN  LDL goal: <70  Labs: TC 106, Trigs 115, HDL 40, LDL 45 (10/04/21 while on rosuvastatin)  Coronary calcium score of 429. This was 40 percentile for age-, sex, and race-matched controls.   2. Normal coronary origin with right dominance.   3. Severe mixed density stenosis in the mid LAD (70-99%).   4. Minimal calcified plaque (<25%) in the LCX.   5. Mild to moderate mid RCA mixed density plaque (40-50%).   6. Mild calcified plaque in the PDA (25-49%).   7. Moderate non-calcified plaque (50-69%) in the PLV.  Past Medical History:  Diagnosis Date   Arthritis    BPH (benign prostatic hyperplasia)    Chronic back pain    Family history of adverse reaction to anesthesia    father died during carotid artery surgery   GERD (gastroesophageal reflux disease)    Hyperlipidemia    Hypertension    Pneumonia    Spinal stenosis     Current Outpatient Medications on File Prior to Visit  Medication Sig Dispense Refill   acetaminophen (TYLENOL) 650 MG CR tablet Take 1,300 mg by mouth every 8 (eight) hours as needed for pain. (Patient not taking: Reported on 10/29/2021)     amLODipine (NORVASC) 10 MG tablet Take 10 mg by mouth  daily.     aspirin EC 81 MG tablet Take 81 mg by mouth at bedtime. Swallow whole.     bisoprolol-hydrochlorothiazide (ZIAC) 10-6.25 MG per tablet Take 1 tablet by mouth daily.     finasteride (PROSCAR) 5 MG tablet Take 5 mg by mouth at bedtime.     fluocinonide cream (LIDEX) 4.09 % Apply 1 application topically daily as needed (itchy ears). (Patient not taking: Reported on 10/29/2021)     ibuprofen (ADVIL) 200 MG tablet Take 400 mg by mouth 2 (two) times daily as needed (pain.). (Patient not taking: Reported on 10/29/2021)     Polyethyl Glycol-Propyl Glycol (LUBRICANT EYE DROPS) 0.4-0.3 % SOLN Place 1-2 drops into both eyes 3 (three) times daily as needed (dry/irritated eyes.).     Current Facility-Administered Medications on File Prior to Visit  Medication Dose Route Frequency Provider Last Rate Last Admin   sodium chloride flush (NS) 0.9 % injection 3 mL  3 mL Intravenous Q12H Crenshaw, Denice Bors, MD        Allergies  Allergen Reactions   Lipitor [Atorvastatin] Other (See Comments)    MUSCLE ACHES    Zetia [Ezetimibe] Other (See Comments)    ACHES    Zocor [Simvastatin] Other (See Comments)  ACHES   Crestor [Rosuvastatin Calcium]     Joint and muscle pain   Other Rash    FISH    Penicillins Rash    Assessment/Plan:  1. Hyperlipidemia - Patient most recent LDL 45 which was at goal of <70 when patient was on rosuvastatin '40mg'$ . Unfortunately, was not able to tolerate and had to d/c.  Due to CAD and coronary calcium buildup, recommend continued risk reduction with PCSK9i.    Using demo pen, educated patient on mechanism of action, storage, site selection, administration, and possible adverse effects.  Patient voiced understanding. Will complete PA and contact patient when approved.  Recheck lipid panel in 2-3 months.  Start Repatha/Praluent sq q 14 days Check lipid panel in 2-3 months  Karren Cobble, PharmD, [place], [place], [place], [place], [place],  [address] Phone: [phone], Fax: [phone]

## 2022-01-24 MED ORDER — PRALUENT 75 MG/ML ~~LOC~~ SOAJ: 75.0000 mg | SUBCUTANEOUS | 11 refills | Status: DC

## 2022-01-24 NOTE — Telephone Encounter (Signed)
[address] ([facility]) - [id number] ?Praluent '75MG'$ /ML auto-injectors ?Status: PA Request ?Created: May 1st, 2023 ?Sent: May 1st, 2023 ? ?LIPID PANEL ORDERED/RELEASED ?

## 2022-01-24 NOTE — Addendum Note (Signed)
Addended by: Allean Found on: 01/24/2022 07:59 AM ? ? Modules accepted: Orders ? ?

## 2022-01-24 NOTE — Telephone Encounter (Signed)
Called and spoke  w/pt and stated that they were approved for the praluent '75mg'$  q2w and rxsent. Advised pt to complete fasting lab work post 4th dose and to call if the medication is unaffordable. Pt voiced understanding.  ?

## 2022-01-24 NOTE — Addendum Note (Signed)
Addended by: Allean Found on: 01/24/2022 11:38 AM ? ? Modules accepted: Orders ? ?

## 2022-03-01 DIAGNOSIS — M9904 Segmental and somatic dysfunction of sacral region: Secondary | ICD-10-CM | POA: Diagnosis not present

## 2022-03-01 DIAGNOSIS — M5136 Other intervertebral disc degeneration, lumbar region: Secondary | ICD-10-CM | POA: Diagnosis not present

## 2022-03-01 DIAGNOSIS — M5032 Other cervical disc degeneration, mid-cervical region, unspecified level: Secondary | ICD-10-CM | POA: Diagnosis not present

## 2022-03-01 DIAGNOSIS — M5137 Other intervertebral disc degeneration, lumbosacral region: Secondary | ICD-10-CM | POA: Diagnosis not present

## 2022-03-01 DIAGNOSIS — M9901 Segmental and somatic dysfunction of cervical region: Secondary | ICD-10-CM | POA: Diagnosis not present

## 2022-03-01 DIAGNOSIS — M9903 Segmental and somatic dysfunction of lumbar region: Secondary | ICD-10-CM | POA: Diagnosis not present

## 2022-04-12 DIAGNOSIS — M5032 Other cervical disc degeneration, mid-cervical region, unspecified level: Secondary | ICD-10-CM | POA: Diagnosis not present

## 2022-04-12 DIAGNOSIS — M9904 Segmental and somatic dysfunction of sacral region: Secondary | ICD-10-CM | POA: Diagnosis not present

## 2022-04-12 DIAGNOSIS — M5137 Other intervertebral disc degeneration, lumbosacral region: Secondary | ICD-10-CM | POA: Diagnosis not present

## 2022-04-12 DIAGNOSIS — M5136 Other intervertebral disc degeneration, lumbar region: Secondary | ICD-10-CM | POA: Diagnosis not present

## 2022-04-12 DIAGNOSIS — M9901 Segmental and somatic dysfunction of cervical region: Secondary | ICD-10-CM | POA: Diagnosis not present

## 2022-04-12 DIAGNOSIS — M9903 Segmental and somatic dysfunction of lumbar region: Secondary | ICD-10-CM | POA: Diagnosis not present

## 2022-06-06 DIAGNOSIS — E669 Obesity, unspecified: Secondary | ICD-10-CM | POA: Diagnosis not present

## 2022-06-06 DIAGNOSIS — E782 Mixed hyperlipidemia: Secondary | ICD-10-CM | POA: Diagnosis not present

## 2022-06-06 DIAGNOSIS — N182 Chronic kidney disease, stage 2 (mild): Secondary | ICD-10-CM | POA: Diagnosis not present

## 2022-06-06 DIAGNOSIS — I2510 Atherosclerotic heart disease of native coronary artery without angina pectoris: Secondary | ICD-10-CM | POA: Diagnosis not present

## 2022-06-06 DIAGNOSIS — R7301 Impaired fasting glucose: Secondary | ICD-10-CM | POA: Diagnosis not present

## 2022-06-06 DIAGNOSIS — I1310 Hypertensive heart and chronic kidney disease without heart failure, with stage 1 through stage 4 chronic kidney disease, or unspecified chronic kidney disease: Secondary | ICD-10-CM | POA: Diagnosis not present

## 2022-06-08 ENCOUNTER — Other Ambulatory Visit: Payer: Self-pay | Admitting: Family Medicine

## 2022-06-08 DIAGNOSIS — I2510 Atherosclerotic heart disease of native coronary artery without angina pectoris: Secondary | ICD-10-CM

## 2022-06-09 ENCOUNTER — Encounter: Payer: Self-pay | Admitting: *Deleted

## 2022-06-22 ENCOUNTER — Ambulatory Visit
Admission: RE | Admit: 2022-06-22 | Discharge: 2022-06-22 | Disposition: A | Discharge status: Home / Self Care | Payer: Medicare HMO | Source: Ambulatory Visit | Attending: Family Medicine | Admitting: Family Medicine

## 2022-06-22 ENCOUNTER — Ambulatory Visit: Payer: Medicare HMO | Admitting: Orthopaedic Surgery

## 2022-06-22 ENCOUNTER — Encounter: Payer: Self-pay | Admitting: Orthopaedic Surgery

## 2022-06-22 VITALS — Ht 70.0 in | Wt 214.2 lb

## 2022-06-22 DIAGNOSIS — M7062 Trochanteric bursitis, left hip: Secondary | ICD-10-CM | POA: Diagnosis not present

## 2022-06-22 DIAGNOSIS — I10 Essential (primary) hypertension: Secondary | ICD-10-CM | POA: Diagnosis not present

## 2022-06-22 DIAGNOSIS — N179 Acute kidney failure, unspecified: Secondary | ICD-10-CM | POA: Diagnosis not present

## 2022-06-22 DIAGNOSIS — M25552 Pain in left hip: Secondary | ICD-10-CM

## 2022-06-22 DIAGNOSIS — I2510 Atherosclerotic heart disease of native coronary artery without angina pectoris: Secondary | ICD-10-CM

## 2022-06-22 DIAGNOSIS — E785 Hyperlipidemia, unspecified: Secondary | ICD-10-CM | POA: Diagnosis not present

## 2022-06-22 DIAGNOSIS — I6523 Occlusion and stenosis of bilateral carotid arteries: Secondary | ICD-10-CM | POA: Diagnosis not present

## 2022-06-22 MED ORDER — METHYLPREDNISOLONE ACETATE 40 MG/ML IJ SUSP
40.0000 mg | INTRAMUSCULAR | Status: AC | PRN
  Administered 2022-06-22: 40 mg via INTRA_ARTICULAR

## 2022-06-22 MED ORDER — LIDOCAINE HCL 1 % IJ SOLN
3.0000 mL | INTRAMUSCULAR | Status: AC | PRN
  Administered 2022-06-22: 3 mL

## 2022-06-22 NOTE — Progress Notes (Signed)
Office Visit Note   Patient: Ruben Rodriguez           Date of Birth: Aug 23, 1946           MRN: [id number] Visit Date: 06/22/2022              Requested by: Mayra Neer, MD [address] [place],  [place] [address] PCP: Mayra Neer, MD   Assessment & Plan: Visit Diagnoses:  1. Pain of left hip   2. Trochanteric bursitis, left hip     Plan: I did recommend stretching exercises for his left hip as well as topical Voltaren gel.  Also recommended steroid injection over the trochanteric area and he agreed to this and tolerated it well.  All questions and concerns were answered and addressed.  Follow-up can be as needed.  If he does come in again with the hip hurting I would like to have a standing AP pelvis and a lateral of the left hip.  Follow-Up Instructions: Return if symptoms worsen or fail to improve.   Orders:  Orders Placed This Encounter  Procedures   Large Joint Inj   No orders of the defined types were placed in this encounter.     Procedures: Large Joint Inj: L greater trochanter on 06/22/2022 10:22 AM Indications: pain and diagnostic evaluation Details: 22 G 1.5 in needle, lateral approach  Arthrogram: No  Medications: 3 mL lidocaine 1 %; 40 mg methylPREDNISolone acetate 40 MG/ML Outcome: tolerated well, no immediate complications Procedure, treatment alternatives, risks and benefits explained, specific risks discussed. Consent was given by the patient. Immediately prior to procedure a time out was called to verify the correct patient, procedure, equipment, support staff and site/side marked as required. Patient was prepped and draped in the usual sterile fashion.       Clinical Data: No additional findings.   Subjective: Chief Complaint  Patient presents with   Left Hip - Pain  The patient is a very pleasant 76 year old gentleman I am seeing for the first time.  He comes in for evaluation treatment of left hip pain.  He points to the  lateral aspect of his hip as a source of his pain.  He actually has a MRI of that left hip in 2019 showing some degenerative changes in the hip but these were just mild at the time.  He has had lumbar spine surgery as well.  He is not a diabetic.  He does go to the gym on a daily basis.  He also has Pilates once a week and he said that helps him more than anything.  He has tried oral diclofenac before.  His primary care physician really wants him off of anti-inflammatories orally because he was taken for a long period of time.  He is not obese and is not a diabetic. He denies any left groin pain. HPI  Review of Systems There is currently listed no headache, chest pain, shortness of breath, fever, chills, nausea, vomiting  Objective: Vital Signs: Ht '5\' 10"'$  (1.778 m)   Wt 214 lb 3.2 oz (97.2 kg)   BMI 30.73 kg/m   Physical Exam He is alert and orient x3 and in no acute distress Ortho Exam I did examine his left hip.  It seems to move smoothly and fluidly throughout its arc of rotation with no blocks to rotation and no significant stiffness.  He does have significant pain to palpation over the left hip trochanteric area and pain over that  area with rotation of the left hip. Specialty Comments:  No specialty comments available.  Imaging: No results found.   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   Coronary artery disease 01/20/2022   Eczema 01/20/2022   Essential hypertension 01/20/2022   Hardening of the aorta (main artery of the heart) ([place]) 01/20/2022   History of adenomatous polyp of colon 01/20/2022   Impaired fasting glucose 01/20/2022   Low back pain 01/20/2022   Mixed hyperlipidemia 01/20/2022   Senile purpura ([place]) 01/20/2022   Obesity 01/20/2022   Spinal stenosis 01/20/2022   Allergic bronchitis 01/20/2022   Abnormal cardiac CT angiography 08/27/2021    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   Past Medical History:  Diagnosis Date   Arthritis    BPH (benign prostatic hyperplasia)    Chronic back pain    Family history of adverse reaction to anesthesia    father died during carotid artery surgery   GERD (gastroesophageal reflux disease)    Hyperlipidemia    Hypertension    Pneumonia    Spinal stenosis     Family History  Problem Relation Age of Onset   Hypertension Mother     Past Surgical History:  Procedure Laterality Date   COLONOSCOPY     FINGER SURGERY     to remove a tumor from right little finger   LAMINECTOMY WITH POSTERIOR LATERAL ARTHRODESIS LEVEL 2 N/A 08/01/2018   Procedure: LUMBAR THREE- LUMBAR FOUR, LUMBAR FOUR-LUMBAR FIVE DECOMPRESSION, LUMBAR THREE-LUMBAR FOUR, LUMBAR FOUR-LUMBAR FIVE POSTEROLATERAL ARTHRODESIS;  Surgeon: Jovita Gamma, MD;  Location: [facility];  Service: Neurosurgery;  Laterality: N/A;   LEFT HEART CATH AND CORONARY ANGIOGRAPHY N/A 08/27/2021   Procedure: LEFT HEART CATH AND CORONARY ANGIOGRAPHY;  Surgeon: Sherren Mocha, MD;  Location: [facility];  Service: Cardiovascular;  Laterality: N/A;   Social History   Occupational History   Not on file  Tobacco Use   Smoking status: Former   Smokeless tobacco: Never  Vaping Use   Vaping Use: Never used  Substance and Sexual Activity   Alcohol use: No    Comment: Occasional   Drug use: No   Sexual activity: Not on file

## 2022-07-04 DIAGNOSIS — L255 Unspecified contact dermatitis due to plants, except food: Secondary | ICD-10-CM | POA: Diagnosis not present

## 2022-08-17 ENCOUNTER — Ambulatory Visit: Payer: Medicare HMO | Admitting: Cardiology

## 2022-08-17 ENCOUNTER — Encounter: Payer: Self-pay | Admitting: Cardiology

## 2022-08-17 VITALS — BP 122/84 | HR 78 | Ht 69.0 in | Wt 216.1 lb

## 2022-08-17 DIAGNOSIS — E782 Mixed hyperlipidemia: Secondary | ICD-10-CM | POA: Diagnosis not present

## 2022-08-17 DIAGNOSIS — I2510 Atherosclerotic heart disease of native coronary artery without angina pectoris: Secondary | ICD-10-CM | POA: Diagnosis not present

## 2022-08-17 DIAGNOSIS — I10 Essential (primary) hypertension: Secondary | ICD-10-CM | POA: Diagnosis not present

## 2022-08-17 NOTE — Patient Instructions (Signed)
  Follow-Up: At [facility], you and your health needs are our priority.  As part of our continuing mission to provide you with exceptional heart care, we have created designated Provider Care Teams.  These Care Teams include your primary Cardiologist (physician) and Advanced Practice Providers (APPs -  Physician Assistants and Nurse Practitioners) who all work together to provide you with the care you need, when you need it.  We recommend signing up for the patient portal called "MyChart".  Sign up information is provided on this After Visit Summary.  MyChart is used to connect with patients for Virtual Visits (Telemedicine).  Patients are able to view lab/test results, encounter notes, upcoming appointments, etc.  Non-urgent messages can be sent to your provider as well.   To learn more about what you can do with MyChart, go to [email or web address].    Your next appointment:   12 month(s)  The format for your next appointment:   In Person  Provider:   Brian Crenshaw, MD   

## 2022-08-17 NOTE — Progress Notes (Signed)
HPI: FU CAD. Renal Dopplers November 2018 showed no renal artery stenosis. Calcium score September 2022 561 which was 68 percentile.  Also with aortic atherosclerosis. Cardiac CTA (08/16/21).  Calcium score 429, severe stenosis in the mid LAD, mild to moderate mid right coronary artery disease at 40 to 50%, moderate disease in the posterior lateral at 50 to 69%.  LAD lesion significant by FFR.  Abdominal ultrasound December 2022 showed no aneurysm.  Cardiac catheterization December 2022 showed severe mid LAD lesion of 80% and 90% first posterior lateral with low LVEDP.  Medical therapy recommended.  If he develops symptoms PCI of the LAD could be performed.  Carotid Doppler September 2023 showed no significant stenosis of the internal carotid arteries.  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  Since last seen,   Current Outpatient Medications  Medication Sig Dispense Refill   acetaminophen (TYLENOL) 650 MG CR tablet Take 1,300 mg by mouth every 8 (eight) hours as needed for pain.     Alirocumab (PRALUENT) 75 MG/ML SOAJ Inject 75 mg into the skin every 14 (fourteen) days. 2 mL 11   amLODipine (NORVASC) 10 MG tablet Take 10 mg by mouth daily.     aspirin EC 81 MG tablet Take 81 mg by mouth at bedtime. Swallow whole.     bisoprolol-hydrochlorothiazide (ZIAC) 10-6.25 MG per tablet Take 1 tablet by mouth daily.     finasteride (PROSCAR) 5 MG tablet Take 5 mg by mouth at bedtime.     fluocinonide cream (LIDEX) 8.67 % Apply 1 application. topically daily as needed (itchy ears).     ibuprofen (ADVIL) 200 MG tablet Take 400 mg by mouth 2 (two) times daily as needed (pain.).     Polyethyl Glycol-Propyl Glycol (LUBRICANT EYE DROPS) 0.4-0.3 % SOLN Place 1-2 drops into both eyes 3 (three) times daily as needed (dry/irritated eyes.). (Patient not taking: Reported on 08/17/2022)     Current Facility-Administered Medications  Medication Dose Route Frequency  Provider Last Rate Last Admin   sodium chloride flush (NS) 0.9 % injection 3 mL  3 mL Intravenous Q12H Lelon Perla, MD         Past Medical History:  Diagnosis Date   Arthritis    BPH (benign prostatic hyperplasia)    Chronic back pain    Family history of adverse reaction to anesthesia    father died during carotid artery surgery   GERD (gastroesophageal reflux disease)    Hyperlipidemia    Hypertension    Pneumonia    Spinal stenosis     Past Surgical History:  Procedure Laterality Date   COLONOSCOPY     FINGER SURGERY     to remove a tumor from right little finger   LAMINECTOMY WITH POSTERIOR LATERAL ARTHRODESIS LEVEL 2 N/A 08/01/2018   Procedure: LUMBAR THREE- LUMBAR FOUR, LUMBAR FOUR-LUMBAR FIVE DECOMPRESSION, LUMBAR THREE-LUMBAR FOUR, LUMBAR FOUR-LUMBAR FIVE POSTEROLATERAL ARTHRODESIS;  Surgeon: Jovita Gamma, MD;  Location: [facility];  Service: Neurosurgery;  Laterality: N/A;   LEFT HEART CATH AND CORONARY ANGIOGRAPHY N/A 08/27/2021   Procedure: LEFT HEART CATH AND CORONARY ANGIOGRAPHY;  Surgeon: Sherren Mocha, MD;  Location: [facility];  Service: Cardiovascular;  Laterality: N/A;    Social History   Socioeconomic History   Marital status: Married    Spouse name: Not on file   Number of children: 2   Years of education: Not on file   Highest education level: Not on file  Occupational History   Not on file  Tobacco Use   Smoking status: Former   Smokeless tobacco: Never  Vaping Use   Vaping Use: Never used  Substance and Sexual Activity   Alcohol use: No    Comment: Occasional   Drug use: No   Sexual activity: Not on file  Other Topics Concern   Not on file  Social History Narrative   Not on file   Social Determinants of Health   Financial Resource Strain: Not on file  Food Insecurity: Not on file  Transportation Needs: Not on file  Physical Activity: Not on file  Stress: Not on file  Social Connections: Not on file  Intimate Partner  Violence: Not on file    Family History  Problem Relation Age of Onset   Hypertension Mother     ROS: no fevers or chills, productive cough, hemoptysis, dysphasia, odynophagia, melena, hematochezia, dysuria, hematuria, rash, seizure activity, orthopnea, PND, pedal edema, claudication. Remaining systems are negative.  Physical Exam: Well-developed well-nourished in no acute distress.  Skin is warm and dry.  HEENT is normal.  Neck is supple.  Chest is clear to auscultation with normal expansion.  Cardiovascular exam is regular rate and rhythm.  Abdominal exam nontender or distended. No masses palpated. Extremities show no edema. neuro grossly intact  ECG-normal sinus rhythm with no ST changes.  Personally reviewed  A/P  1 coronary artery disease-patient doing well with no chest pain.  Continue medical therapy with aspirin; intolerant to statins.  If patient develops significant symptoms then PCI of the LAD could be performed.  2 hypertension-patient's blood pressure is controlled today.  Continue present medical regimen.  3 hyperlipidemia-patient is intolerant to statins.  Continue Praluent.  Kirk Ruths, MD

## 2022-09-07 ENCOUNTER — Ambulatory Visit: Payer: Medicare HMO | Admitting: Cardiology

## 2022-09-28 DIAGNOSIS — Z85828 Personal history of other malignant neoplasm of skin: Secondary | ICD-10-CM | POA: Diagnosis not present

## 2022-09-28 DIAGNOSIS — L570 Actinic keratosis: Secondary | ICD-10-CM | POA: Diagnosis not present

## 2022-09-28 DIAGNOSIS — L821 Other seborrheic keratosis: Secondary | ICD-10-CM | POA: Diagnosis not present

## 2022-09-28 DIAGNOSIS — D225 Melanocytic nevi of trunk: Secondary | ICD-10-CM | POA: Diagnosis not present

## 2022-09-29 ENCOUNTER — Telehealth: Payer: Self-pay | Admitting: Cardiology

## 2022-09-29 DIAGNOSIS — R69 Illness, unspecified: Secondary | ICD-10-CM | POA: Diagnosis not present

## 2022-09-29 MED ORDER — REPATHA SURECLICK 140 MG/ML ~~LOC~~ SOAJ: 1.0000 | SUBCUTANEOUS | 11 refills | Status: DC

## 2022-09-29 NOTE — Telephone Encounter (Signed)
Prior auth submitted for Repatha, key [id number], prior auth approved through 09/26/23. Rx sent to pharmacy, pt aware.

## 2022-09-29 NOTE — Telephone Encounter (Signed)
Spoke to patient who states that his insurance will no longer cover the Praulent and that they will cover Repatha. Patient would like to know if this can be switched for him. Advised patient I would forward message over to PharmD for review and advice. Patient verbalized understanding.

## 2022-09-29 NOTE — Telephone Encounter (Signed)
Pt c/o medication issue:  1. Name of Medication:   Alirocumab ([place]) 75 MG/ML SOAJ    2. How are you currently taking this medication (dosage and times per day)? Inject every 14 days  3. Are you having a reaction (difficulty breathing--STAT)? no  4. What is your medication issue? Patient calling to speak with Hilda Blades. He says he was notified by his insurance that the praluent will not be covered and they suggested repatha.

## 2022-10-06 DIAGNOSIS — R69 Illness, unspecified: Secondary | ICD-10-CM | POA: Diagnosis not present

## 2022-10-13 DIAGNOSIS — R69 Illness, unspecified: Secondary | ICD-10-CM | POA: Diagnosis not present

## 2022-10-20 DIAGNOSIS — R69 Illness, unspecified: Secondary | ICD-10-CM | POA: Diagnosis not present

## 2022-10-27 DIAGNOSIS — R69 Illness, unspecified: Secondary | ICD-10-CM | POA: Diagnosis not present

## 2022-10-31 DIAGNOSIS — Z961 Presence of intraocular lens: Secondary | ICD-10-CM | POA: Diagnosis not present

## 2022-11-24 DIAGNOSIS — R69 Illness, unspecified: Secondary | ICD-10-CM | POA: Diagnosis not present

## 2022-12-01 DIAGNOSIS — E782 Mixed hyperlipidemia: Secondary | ICD-10-CM | POA: Diagnosis not present

## 2022-12-01 DIAGNOSIS — I2510 Atherosclerotic heart disease of native coronary artery without angina pectoris: Secondary | ICD-10-CM | POA: Diagnosis not present

## 2022-12-01 DIAGNOSIS — N400 Enlarged prostate without lower urinary tract symptoms: Secondary | ICD-10-CM | POA: Diagnosis not present

## 2022-12-01 DIAGNOSIS — M4800 Spinal stenosis, site unspecified: Secondary | ICD-10-CM | POA: Diagnosis not present

## 2022-12-01 DIAGNOSIS — I10 Essential (primary) hypertension: Secondary | ICD-10-CM | POA: Diagnosis not present

## 2022-12-01 DIAGNOSIS — D692 Other nonthrombocytopenic purpura: Secondary | ICD-10-CM | POA: Diagnosis not present

## 2022-12-01 DIAGNOSIS — I129 Hypertensive chronic kidney disease with stage 1 through stage 4 chronic kidney disease, or unspecified chronic kidney disease: Secondary | ICD-10-CM | POA: Diagnosis not present

## 2022-12-01 DIAGNOSIS — E669 Obesity, unspecified: Secondary | ICD-10-CM | POA: Diagnosis not present

## 2022-12-01 DIAGNOSIS — N182 Chronic kidney disease, stage 2 (mild): Secondary | ICD-10-CM | POA: Diagnosis not present

## 2022-12-01 DIAGNOSIS — R7301 Impaired fasting glucose: Secondary | ICD-10-CM | POA: Diagnosis not present

## 2022-12-01 DIAGNOSIS — L309 Dermatitis, unspecified: Secondary | ICD-10-CM | POA: Diagnosis not present

## 2022-12-01 DIAGNOSIS — I700 Atherosclerosis of aorta: Secondary | ICD-10-CM | POA: Diagnosis not present

## 2022-12-01 DIAGNOSIS — Z125 Encounter for screening for malignant neoplasm of prostate: Secondary | ICD-10-CM | POA: Diagnosis not present

## 2022-12-01 DIAGNOSIS — Z0000 Encounter for general adult medical examination without abnormal findings: Secondary | ICD-10-CM | POA: Diagnosis not present

## 2022-12-08 ENCOUNTER — Encounter: Payer: Self-pay | Admitting: *Deleted

## 2022-12-22 DIAGNOSIS — R69 Illness, unspecified: Secondary | ICD-10-CM | POA: Diagnosis not present

## 2023-01-19 ENCOUNTER — Encounter: Payer: Self-pay | Admitting: *Deleted

## 2023-01-19 DIAGNOSIS — R69 Illness, unspecified: Secondary | ICD-10-CM | POA: Diagnosis not present

## 2023-01-25 DIAGNOSIS — R69 Illness, unspecified: Secondary | ICD-10-CM | POA: Diagnosis not present

## 2023-02-25 DIAGNOSIS — R69 Illness, unspecified: Secondary | ICD-10-CM | POA: Diagnosis not present

## 2023-03-01 IMAGING — CT CT CARDIAC CORONARY ARTERY CALCIUM SCORE
3 series · 14 of 20 positions shown, 16 images · non-contrast
Comparison: None.

CLINICAL DATA: Hyperlipidemia, former smoker, family history

EXAM:
CT CARDIAC CORONARY ARTERY CALCIUM SCORE
TECHNIQUE: Non-contrast imaging through the heart was performed using
prospective ECG gating. Image post processing was performed on an
independent workstation, allowing for quantitative analysis of the
heart and coronary arteries. Note that this exam targets the heart
and the chest was not imaged in its entirety.

[Series 2: calcium scoring 2.00 qr36 bestdiast 69% hrt calciu · axial · 0.45mm/px · z∈[+1596,+1662]mm · 4 of 57 slices shown]
[im 12/57  vessel]
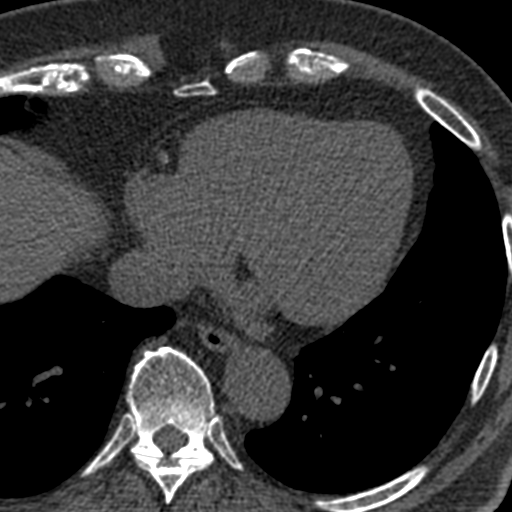
[im 23/57  vessel]
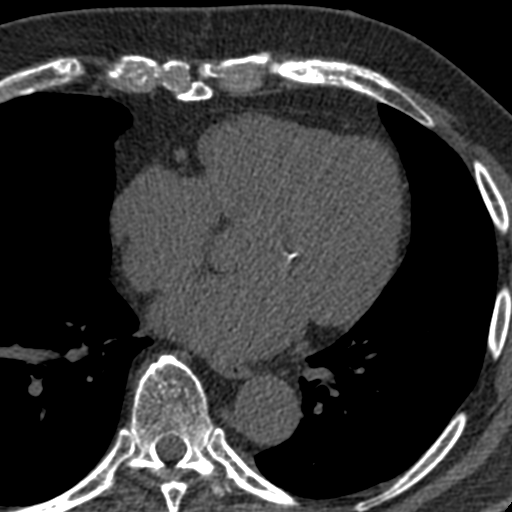
[im 34/57  vessel]
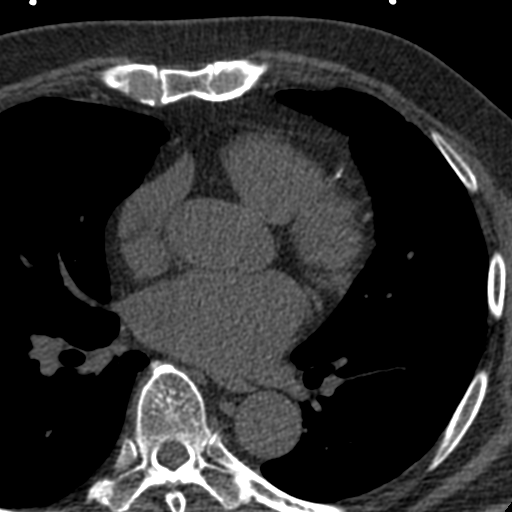
[im 45/57  vessel]
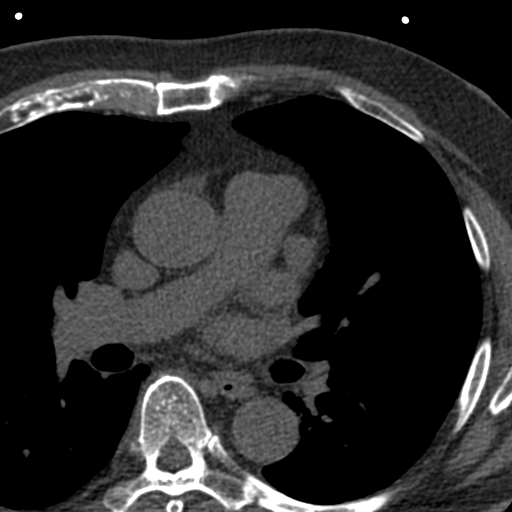

[Series 3: calcium scoring 2.00 br40 bestdiast 69% axial · axial · 0.58mm/px · z∈[+1592,+1666]mm · 5 of 57 slices shown, 7 images]
[im 10/57  vessel]
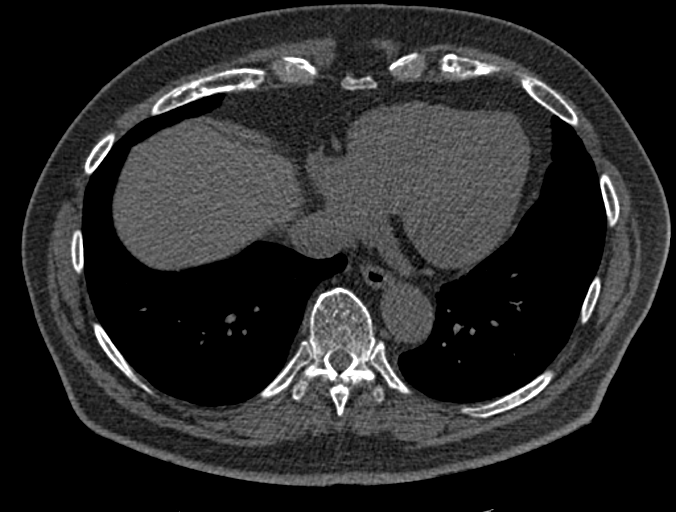
[im 10/57  lung]
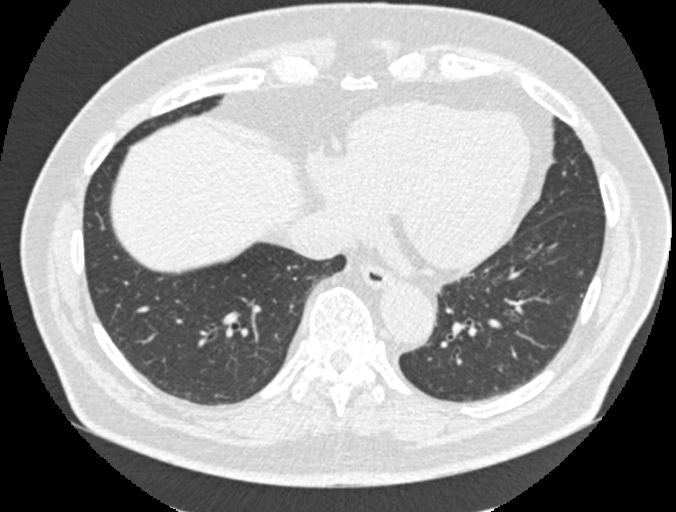
[im 19/57  vessel]
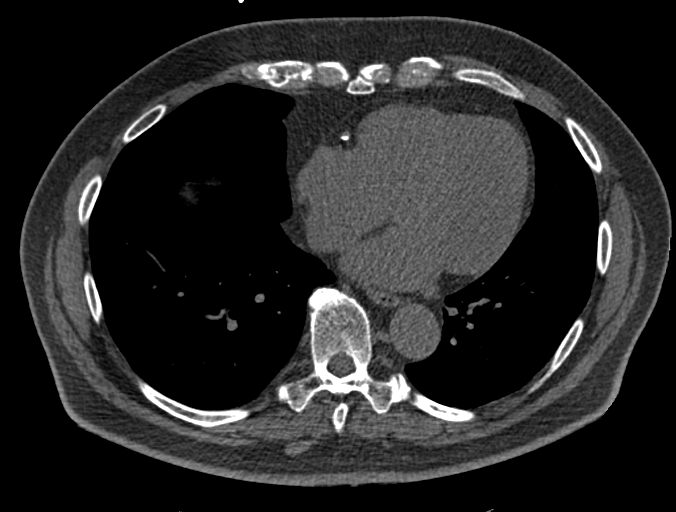
[im 29/57  vessel]
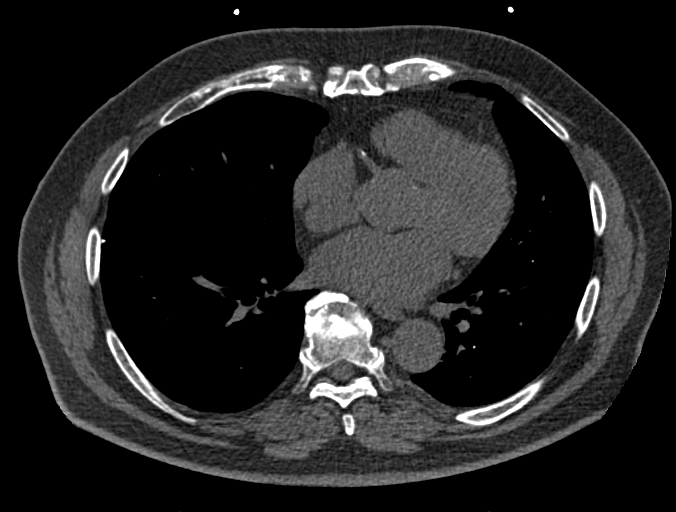
[im 38/57  vessel]
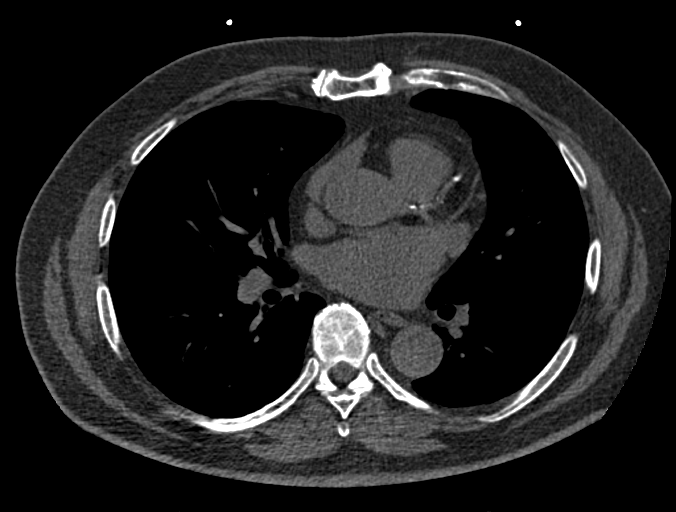
[im 47/57  vessel]
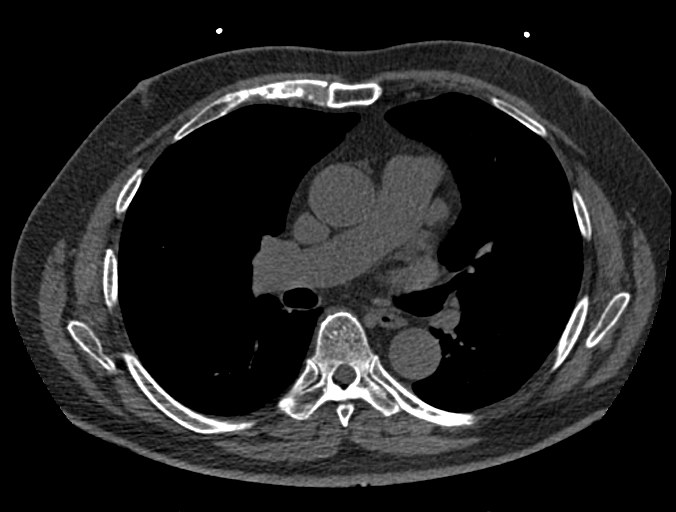
[im 47/57  lung]
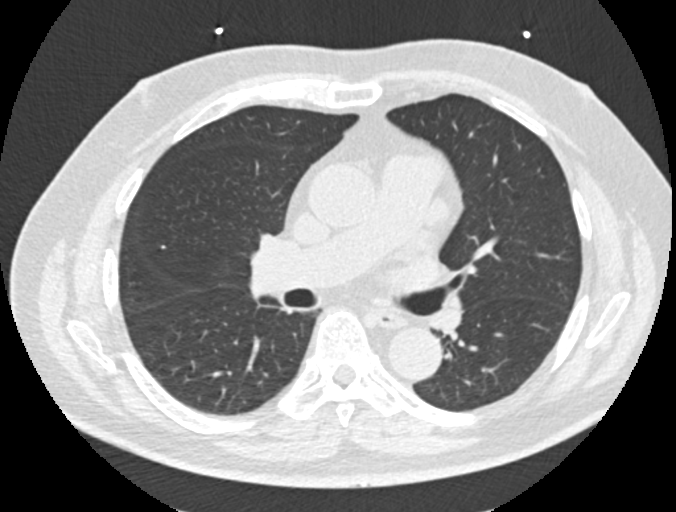

[Series 9: calcium scoring 2.00 br60 bestdiast 69% lungs · axial · 0.58mm/px · z∈[+1592,+1666]mm · 5 of 57 slices shown]
[im 10/57  vessel]
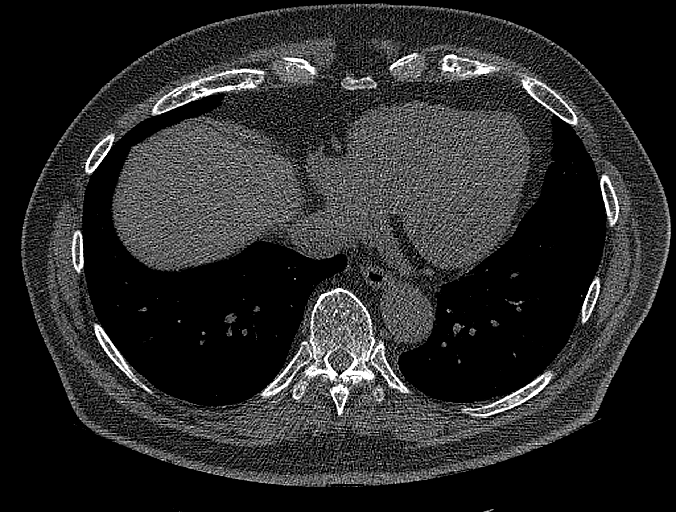
[im 19/57  vessel]
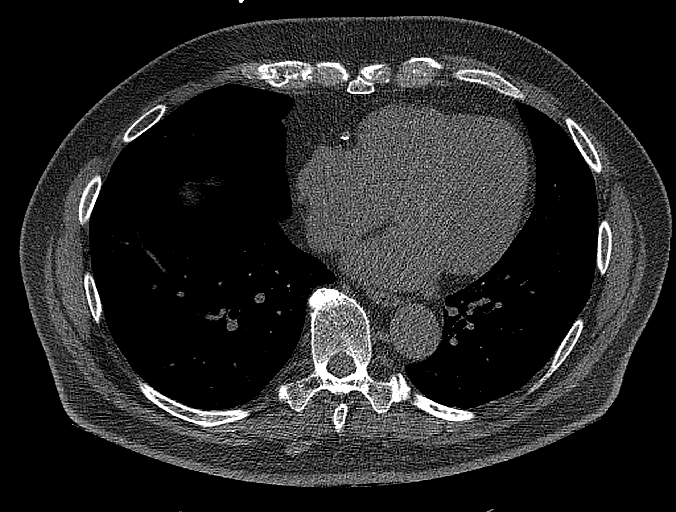
[im 29/57  vessel]
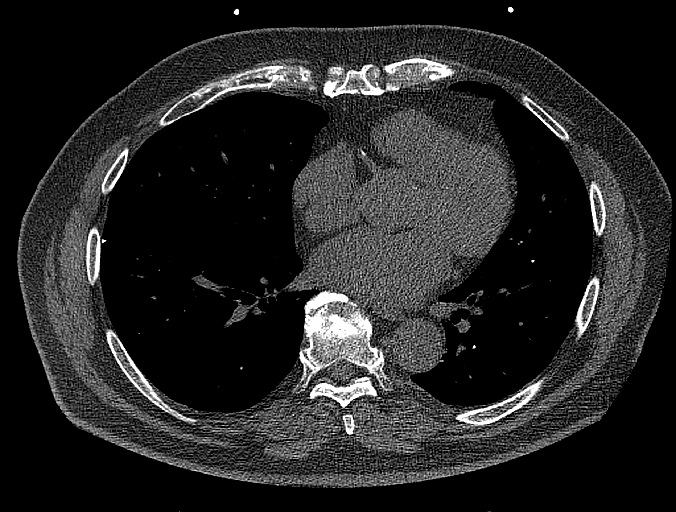
[im 38/57  vessel]
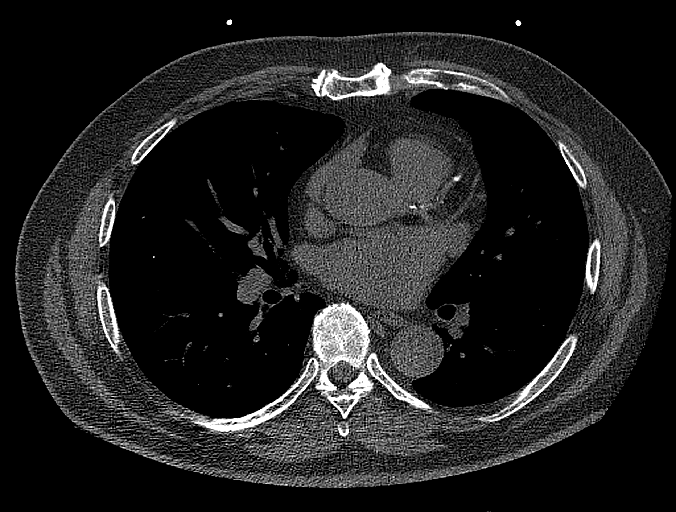
[im 47/57  vessel]
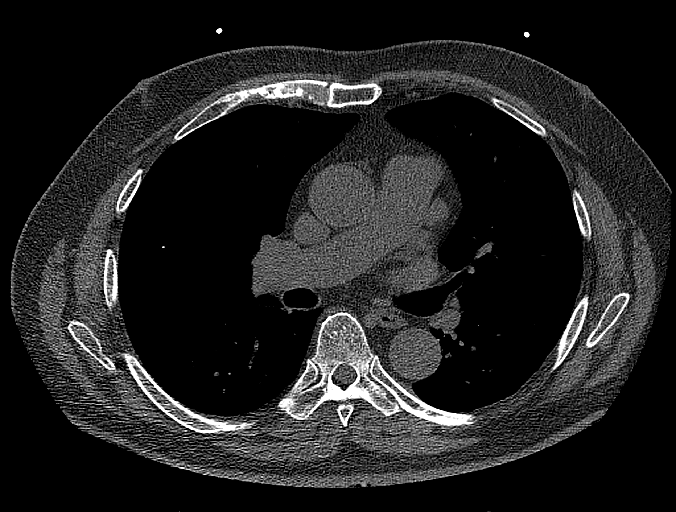

[14 of 20 positions shown; findings below may reference images not displayed]

FINDINGS: CORONARY CALCIUM SCORES:

Left Main: 31

LAD: 211

LCx: 27

RCA: 291

Total Agatston Score: 561

[HOSPITAL] percentile: 68

AORTA MEASUREMENTS:

Ascending Aorta: 35 mm

Descending Aorta: 28 mm

OTHER FINDINGS:

Heart is normal size. Aorta normal caliber. No adenopathy. Scattered
calcifications in the descending thoracic aorta. No confluent
opacities or effusions. Scattered calcified granulomas. Imaging into
the upper abdomen demonstrates no acute findings. Chest wall soft
tissues are unremarkable. No acute bony abnormality.
IMPRESSION: Total Agatston score: 561

[HOSPITAL] percentile: 68

Scattered aortic atherosclerosis.

No acute extra cardiac abnormality.

## 2023-05-24 ENCOUNTER — Ambulatory Visit
Admission: RE | Admit: 2023-05-24 | Discharge: 2023-05-24 | Disposition: A | Discharge status: Home / Self Care | Payer: Medicare HMO | Source: Ambulatory Visit | Attending: Physician Assistant | Admitting: Physician Assistant

## 2023-05-24 ENCOUNTER — Other Ambulatory Visit: Payer: Self-pay | Admitting: Physician Assistant

## 2023-05-24 DIAGNOSIS — R109 Unspecified abdominal pain: Secondary | ICD-10-CM

## 2023-05-24 DIAGNOSIS — R1010 Upper abdominal pain, unspecified: Secondary | ICD-10-CM | POA: Diagnosis not present

## 2023-05-24 DIAGNOSIS — I1310 Hypertensive heart and chronic kidney disease without heart failure, with stage 1 through stage 4 chronic kidney disease, or unspecified chronic kidney disease: Secondary | ICD-10-CM | POA: Diagnosis not present

## 2023-05-29 ENCOUNTER — Encounter (HOSPITAL_BASED_OUTPATIENT_CLINIC_OR_DEPARTMENT_OTHER): Payer: Self-pay

## 2023-05-29 ENCOUNTER — Other Ambulatory Visit: Payer: Self-pay

## 2023-05-29 ENCOUNTER — Emergency Department (HOSPITAL_BASED_OUTPATIENT_CLINIC_OR_DEPARTMENT_OTHER): Payer: Medicare HMO

## 2023-05-29 ENCOUNTER — Inpatient Hospital Stay (HOSPITAL_BASED_OUTPATIENT_CLINIC_OR_DEPARTMENT_OTHER)
Admission: EM | Admit: 2023-05-29 | Discharge: 2023-06-02 | DRG: 378 | Disposition: A | Discharge status: Home / Self Care | Payer: Medicare HMO | Attending: Internal Medicine | Admitting: Internal Medicine

## 2023-05-29 DIAGNOSIS — K2980 Duodenitis without bleeding: Secondary | ICD-10-CM | POA: Diagnosis not present

## 2023-05-29 DIAGNOSIS — Z8679 Personal history of other diseases of the circulatory system: Secondary | ICD-10-CM

## 2023-05-29 DIAGNOSIS — E669 Obesity, unspecified: Secondary | ICD-10-CM | POA: Diagnosis present

## 2023-05-29 DIAGNOSIS — K264 Chronic or unspecified duodenal ulcer with hemorrhage: Principal | ICD-10-CM | POA: Diagnosis present

## 2023-05-29 DIAGNOSIS — R948 Abnormal results of function studies of other organs and systems: Secondary | ICD-10-CM | POA: Diagnosis present

## 2023-05-29 DIAGNOSIS — K5730 Diverticulosis of large intestine without perforation or abscess without bleeding: Secondary | ICD-10-CM | POA: Diagnosis not present

## 2023-05-29 DIAGNOSIS — R739 Hyperglycemia, unspecified: Secondary | ICD-10-CM | POA: Diagnosis present

## 2023-05-29 DIAGNOSIS — Z87891 Personal history of nicotine dependence: Secondary | ICD-10-CM

## 2023-05-29 DIAGNOSIS — K449 Diaphragmatic hernia without obstruction or gangrene: Secondary | ICD-10-CM | POA: Diagnosis present

## 2023-05-29 DIAGNOSIS — I739 Peripheral vascular disease, unspecified: Secondary | ICD-10-CM | POA: Diagnosis present

## 2023-05-29 DIAGNOSIS — Z6831 Body mass index (BMI) 31.0-31.9, adult: Secondary | ICD-10-CM

## 2023-05-29 DIAGNOSIS — I700 Atherosclerosis of aorta: Secondary | ICD-10-CM | POA: Diagnosis present

## 2023-05-29 DIAGNOSIS — N400 Enlarged prostate without lower urinary tract symptoms: Secondary | ICD-10-CM | POA: Diagnosis present

## 2023-05-29 DIAGNOSIS — K2100 Gastro-esophageal reflux disease with esophagitis, without bleeding: Secondary | ICD-10-CM | POA: Diagnosis present

## 2023-05-29 DIAGNOSIS — I2510 Atherosclerotic heart disease of native coronary artery without angina pectoris: Secondary | ICD-10-CM | POA: Diagnosis present

## 2023-05-29 DIAGNOSIS — K2981 Duodenitis with bleeding: Secondary | ICD-10-CM | POA: Diagnosis present

## 2023-05-29 DIAGNOSIS — D72829 Elevated white blood cell count, unspecified: Secondary | ICD-10-CM | POA: Diagnosis present

## 2023-05-29 DIAGNOSIS — E785 Hyperlipidemia, unspecified: Secondary | ICD-10-CM | POA: Diagnosis present

## 2023-05-29 DIAGNOSIS — R109 Unspecified abdominal pain: Secondary | ICD-10-CM | POA: Diagnosis not present

## 2023-05-29 DIAGNOSIS — L259 Unspecified contact dermatitis, unspecified cause: Secondary | ICD-10-CM | POA: Diagnosis not present

## 2023-05-29 DIAGNOSIS — I10 Essential (primary) hypertension: Secondary | ICD-10-CM | POA: Diagnosis present

## 2023-05-29 DIAGNOSIS — K630 Abscess of intestine: Secondary | ICD-10-CM

## 2023-05-29 DIAGNOSIS — K2970 Gastritis, unspecified, without bleeding: Secondary | ICD-10-CM | POA: Diagnosis present

## 2023-05-29 DIAGNOSIS — Z8249 Family history of ischemic heart disease and other diseases of the circulatory system: Secondary | ICD-10-CM

## 2023-05-29 DIAGNOSIS — Z79899 Other long term (current) drug therapy: Secondary | ICD-10-CM

## 2023-05-29 DIAGNOSIS — N281 Cyst of kidney, acquired: Secondary | ICD-10-CM | POA: Diagnosis not present

## 2023-05-29 DIAGNOSIS — Z880 Allergy status to penicillin: Secondary | ICD-10-CM

## 2023-05-29 DIAGNOSIS — E876 Hypokalemia: Secondary | ICD-10-CM | POA: Diagnosis present

## 2023-05-29 LAB — URINALYSIS, ROUTINE W REFLEX MICROSCOPIC
Bacteria, UA: NONE SEEN
Bilirubin Urine: NEGATIVE
Glucose, UA: 500 mg/dL — AB
Hgb urine dipstick: NEGATIVE
Ketones, ur: NEGATIVE mg/dL
Leukocytes,Ua: NEGATIVE
Nitrite: NEGATIVE
Specific Gravity, Urine: 1.022 (ref 1.005–1.030)
pH: 6 (ref 5.0–8.0)

## 2023-05-29 LAB — COMPREHENSIVE METABOLIC PANEL
ALT: 16 U/L (ref 0–44)
AST: 13 U/L — ABNORMAL LOW (ref 15–41)
Albumin: 4.2 g/dL (ref 3.5–5.0)
Alkaline Phosphatase: 35 U/L — ABNORMAL LOW (ref 38–126)
Anion gap: 11 (ref 5–15)
BUN: 32 mg/dL — ABNORMAL HIGH (ref 8–23)
CO2: 32 mmol/L (ref 22–32)
Calcium: 9.6 mg/dL (ref 8.9–10.3)
Chloride: 98 mmol/L (ref 98–111)
Creatinine, Ser: 1.18 mg/dL (ref 0.61–1.24)
GFR, Estimated: >60 mL/min (ref >60)
Glucose, Bld: 148 mg/dL — ABNORMAL HIGH (ref 70–99)
Potassium: 3.6 mmol/L (ref 3.5–5.1)
Sodium: 141 mmol/L (ref 135–145)
Total Bilirubin: 0.5 mg/dL (ref 0.3–1.2)
Total Protein: 7 g/dL (ref 6.5–8.1)

## 2023-05-29 LAB — CBC
HCT: 44.6 % (ref 39.0–52.0)
Hemoglobin: 14.7 g/dL (ref 13.0–17.0)
MCH: 26.9 pg (ref 26.0–34.0)
MCHC: 33 g/dL (ref 30.0–36.0)
MCV: 81.5 fL (ref 80.0–100.0)
Platelets: 234 10*3/uL (ref 150–400)
RBC: 5.47 MIL/uL (ref 4.22–5.81)
RDW: 14.1 % (ref 11.5–15.5)
WBC: 11.4 10*3/uL — ABNORMAL HIGH (ref 4.0–10.5)
nRBC: 0 % (ref 0.0–0.2)

## 2023-05-29 LAB — LIPASE, BLOOD: Lipase: 28 U/L (ref 11–51)

## 2023-05-29 MED ORDER — SODIUM CHLORIDE 0.9 % IV BOLUS
500.0000 mL | Freq: Once | INTRAVENOUS | Status: AC
  Administered 2023-05-30: 500 mL via INTRAVENOUS

## 2023-05-29 MED ORDER — ONDANSETRON HCL 4 MG/2ML IJ SOLN
4.0000 mg | Freq: Once | INTRAMUSCULAR | Status: AC
  Administered 2023-05-29: 4 mg via INTRAVENOUS
  Filled 2023-05-29: qty 2

## 2023-05-29 MED ORDER — MORPHINE SULFATE (PF) 4 MG/ML IV SOLN
4.0000 mg | Freq: Once | INTRAVENOUS | Status: AC
  Administered 2023-05-29: 4 mg via INTRAVENOUS
  Filled 2023-05-29: qty 1

## 2023-05-29 MED ORDER — IOHEXOL 350 MG/ML SOLN
100.0000 mL | Freq: Once | INTRAVENOUS | Status: AC | PRN
  Administered 2023-05-29: 90 mL via INTRAVENOUS

## 2023-05-29 MED ORDER — FAMOTIDINE IN NACL 20-0.9 MG/50ML-% IV SOLN
20.0000 mg | Freq: Once | INTRAVENOUS | Status: AC
  Administered 2023-05-30: 20 mg via INTRAVENOUS
  Filled 2023-05-29 (×2): qty 50

## 2023-05-29 NOTE — ED Notes (Signed)
Patient transported to CT 

## 2023-05-29 NOTE — ED Provider Notes (Signed)
[facility] Emergency Department Provider Note MRN:  [id number]  Arrival date & time: 05/30/23     Chief Complaint   Abdominal Pain   History of Present Illness   Ruben Rodriguez is a 77 y.o. year-old male with a history of hypertension, BPH presenting to the ED with chief complaint of abdominal pain.  2 or 3 weeks of epigastric and right lower quadrant abdominal pain, sometimes radiates to the right lower quadrant.  Not going away.  Was told it was constipation and has been taking laxatives.  Having lots of bowel movements but pain not going away.  Pain became more severe this evening.  No fever.  Review of Systems  A thorough review of systems was obtained and all systems are negative except as noted in the HPI and PMH.   Patient's Health History    Past Medical History:  Diagnosis Date   Arthritis    BPH (benign prostatic hyperplasia)    Chronic back pain    Family history of adverse reaction to anesthesia    father died during carotid artery surgery   GERD (gastroesophageal reflux disease)    Hyperlipidemia    Hypertension    Pneumonia    Spinal stenosis     Past Surgical History:  Procedure Laterality Date   COLONOSCOPY     FINGER SURGERY     to remove a tumor from right little finger   LAMINECTOMY WITH POSTERIOR LATERAL ARTHRODESIS LEVEL 2 N/A 08/01/2018   Procedure: LUMBAR THREE- LUMBAR FOUR, LUMBAR FOUR-LUMBAR FIVE DECOMPRESSION, LUMBAR THREE-LUMBAR FOUR, LUMBAR FOUR-LUMBAR FIVE POSTEROLATERAL ARTHRODESIS;  Surgeon: Shirlean Kelly, MD;  Location: [facility];  Service: Neurosurgery;  Laterality: N/A;   LEFT HEART CATH AND CORONARY ANGIOGRAPHY N/A 08/27/2021   Procedure: LEFT HEART CATH AND CORONARY ANGIOGRAPHY;  Surgeon: Tonny Bollman, MD;  Location: [facility];  Service: Cardiovascular;  Laterality: N/A;    Family History  Problem Relation Age of Onset   Hypertension Mother     Social History   Socioeconomic History   Marital  status: Married    Spouse name: Not on file   Number of children: 2   Years of education: Not on file   Highest education level: Not on file  Occupational History   Not on file  Tobacco Use   Smoking status: Former   Smokeless tobacco: Never  Vaping Use   Vaping status: Never Used  Substance and Sexual Activity   Alcohol use: No    Comment: Occasional   Drug use: No   Sexual activity: Not on file  Other Topics Concern   Not on file  Social History Narrative   Not on file   Social Determinants of Health   Financial Resource Strain: Not on file  Food Insecurity: Not on file  Transportation Needs: Not on file  Physical Activity: Not on file  Stress: Not on file  Social Connections: Not on file  Intimate Partner Violence: Not on file     Physical Exam   Vitals:   05/30/23 0005 05/30/23 0321  BP:  (!) 142/91  Pulse:  (!) 54  Resp:  18  Temp: 98.4 F (36.9 C) 97.9 F (36.6 C)  SpO2:  98%    CONSTITUTIONAL: Well-appearing, NAD NEURO/PSYCH:  Alert and oriented x 3, no focal deficits EYES:  eyes equal and reactive ENT/NECK:  no LAD, no JVD CARDIO: Regular rate, well-perfused, normal S1 and S2 PULM:  CTAB no wheezing or rhonchi GI/GU:  non-distended, moderate epigastric tenderness to palpation MSK/SPINE:  No gross deformities, no edema SKIN:  no rash, atraumatic   *Additional and/or pertinent findings included in MDM below  Diagnostic and Interventional Summary    EKG Interpretation Date/Time:  Monday May 29 2023 20:16:31 EDT Ventricular Rate:  79 PR Interval:  170 QRS Duration:  78 QT Interval:  374 QTC Calculation: 428 R Axis:   37  Text Interpretation: Normal sinus rhythm Cannot rule out Anterior infarct , age undetermined Abnormal ECG When compared with ECG of 20-Jul-2018 11:21, Premature atrial complexes are no longer Present ST no longer elevated in Lateral leads Confirmed by Kennis Carina [phone]) on 05/29/2023 11:54:34 PM       Labs Reviewed   COMPREHENSIVE METABOLIC PANEL - Abnormal; Notable for the following components:      Result Value   Glucose, Bld 148 (*)    BUN 32 (*)    AST 13 (*)    Alkaline Phosphatase 35 (*)    All other components within normal limits  CBC - Abnormal; Notable for the following components:   WBC 11.4 (*)    All other components within normal limits  URINALYSIS, ROUTINE W REFLEX MICROSCOPIC - Abnormal; Notable for the following components:   Glucose, UA 500 (*)    Protein, ur TRACE (*)    All other components within normal limits  CULTURE, BLOOD (ROUTINE X 2)  CULTURE, BLOOD (ROUTINE X 2)  LIPASE, BLOOD  H. PYLORI ANTIGEN, STOOL  TROPONIN I (HIGH SENSITIVITY)  TROPONIN I (HIGH SENSITIVITY)    CT ABDOMEN PELVIS W CONTRAST  Final Result      Medications  aspirin EC tablet 81 mg (has no administration in time range)  amLODipine (NORVASC) tablet 10 mg (has no administration in time range)  finasteride (PROSCAR) tablet 5 mg (has no administration in time range)  enoxaparin (LOVENOX) injection 30 mg (has no administration in time range)  sodium chloride flush (NS) 0.9 % injection 3 mL (has no administration in time range)  sodium chloride flush (NS) 0.9 % injection 3 mL (has no administration in time range)  0.9 %  sodium chloride infusion (has no administration in time range)  acetaminophen (TYLENOL) tablet 650 mg (has no administration in time range)    Or  acetaminophen (TYLENOL) suppository 650 mg (has no administration in time range)  senna-docusate (Senokot-S) tablet 1 tablet (has no administration in time range)  ondansetron (ZOFRAN) tablet 4 mg (has no administration in time range)    Or  ondansetron (ZOFRAN) injection 4 mg (has no administration in time range)  alum & mag hydroxide-simeth (MAALOX/MYLANTA) 200-200-20 MG/5ML suspension 15 mL (has no administration in time range)  metroNIDAZOLE (FLAGYL) IVPB 500 mg (has no administration in time range)  ciprofloxacin (CIPRO) IVPB 400  mg (has no administration in time range)  pantoprazole (PROTONIX) injection 40 mg (40 mg Intravenous Given 05/30/23 0628)  hydrALAZINE (APRESOLINE) injection 5 mg (has no administration in time range)  bisoprolol (ZEBETA) tablet 10 mg (has no administration in time range)  famotidine (PEPCID) IVPB 20 mg premix (0 mg Intravenous Stopped 05/30/23 0057)  sodium chloride 0.9 % bolus 500 mL (0 mLs Intravenous Stopped 05/30/23 0057)  ondansetron (ZOFRAN) injection 4 mg (4 mg Intravenous Given 05/29/23 2330)  morphine (PF) 4 MG/ML injection 4 mg (4 mg Intravenous Given 05/29/23 2332)  iohexol (OMNIPAQUE) 350 MG/ML injection 100 mL (90 mLs Intravenous Contrast Given 05/29/23 2338)  ciprofloxacin (CIPRO) IVPB 400 mg (0 mg Intravenous Stopped 05/30/23  0227)  metroNIDAZOLE (FLAGYL) IVPB 500 mg (500 mg Intravenous New Bag/Given 05/30/23 0229)  morphine (PF) 4 MG/ML injection 4 mg (4 mg Intravenous Given 05/30/23 0131)     Procedures  /  Critical Care Procedures  ED Course and Medical Decision Making  Initial Impression and Ddx Differential diagnosis includes pancreatitis, biliary colic, cholecystitis, gastric ulcer, with radiation to the right lower quadrant also considering kidney stone, pyelonephritis, appendicitis, diverticulitis.  Past medical/surgical history that increases complexity of ED encounter: None  Interpretation of Diagnostics I personally reviewed the EKG and my interpretation is as follows: Sinus rhythm without concerning ischemic features  Labs reassuring, minimal leukocytosis otherwise no significant blood count or electrolyte disturbance.  Patient Reassessment and Ultimate Disposition/Management     CT reveals duodenitis with possible intramural abscess/ulcer.  Case discussed with general surgery, they will follow in consultation but no operative needs at this time.  Accepted for admission by hospitalist service.  Providing antibiotics.  Patient management required discussion with the  following services or consulting groups:  Hospitalist Service  Complexity of Problems Addressed Acute illness or injury that poses threat of life of bodily function  Additional Data Reviewed and Analyzed Further history obtained from: Further history from spouse/family member  Additional Factors Impacting ED Encounter Risk Consideration of hospitalization  Elmer Sow. Pilar Plate, MD [facility] Emergency Medicine [facility] [email or web address]  Final Clinical Impressions(s) / ED Diagnoses     ICD-10-CM   1. Duodenitis  K29.80       ED Discharge Orders     None        Discharge Instructions Discussed with and Provided to Patient:   Discharge Instructions   None      Sabas Sous, MD 05/30/23 [phone]

## 2023-05-29 NOTE — ED Triage Notes (Addendum)
Pt to ED c/o upper abdominal pain x 2 weeks. Intermittent in nature. Last BM yesterday. Reports vomiting today. Evaluated at PCP for it on Wed.

## 2023-05-30 ENCOUNTER — Encounter (HOSPITAL_COMMUNITY): Payer: Self-pay | Admitting: Internal Medicine

## 2023-05-30 DIAGNOSIS — Z79899 Other long term (current) drug therapy: Secondary | ICD-10-CM | POA: Diagnosis not present

## 2023-05-30 DIAGNOSIS — Z6831 Body mass index (BMI) 31.0-31.9, adult: Secondary | ICD-10-CM | POA: Diagnosis not present

## 2023-05-30 DIAGNOSIS — I10 Essential (primary) hypertension: Secondary | ICD-10-CM | POA: Diagnosis not present

## 2023-05-30 DIAGNOSIS — R1013 Epigastric pain: Secondary | ICD-10-CM | POA: Diagnosis not present

## 2023-05-30 DIAGNOSIS — R112 Nausea with vomiting, unspecified: Secondary | ICD-10-CM | POA: Diagnosis not present

## 2023-05-30 DIAGNOSIS — N400 Enlarged prostate without lower urinary tract symptoms: Secondary | ICD-10-CM | POA: Diagnosis not present

## 2023-05-30 DIAGNOSIS — Z8249 Family history of ischemic heart disease and other diseases of the circulatory system: Secondary | ICD-10-CM | POA: Diagnosis not present

## 2023-05-30 DIAGNOSIS — K2100 Gastro-esophageal reflux disease with esophagitis, without bleeding: Secondary | ICD-10-CM | POA: Diagnosis not present

## 2023-05-30 DIAGNOSIS — I739 Peripheral vascular disease, unspecified: Secondary | ICD-10-CM | POA: Diagnosis not present

## 2023-05-30 DIAGNOSIS — R739 Hyperglycemia, unspecified: Secondary | ICD-10-CM | POA: Diagnosis not present

## 2023-05-30 DIAGNOSIS — K269 Duodenal ulcer, unspecified as acute or chronic, without hemorrhage or perforation: Secondary | ICD-10-CM | POA: Diagnosis not present

## 2023-05-30 DIAGNOSIS — Z8679 Personal history of other diseases of the circulatory system: Secondary | ICD-10-CM | POA: Diagnosis not present

## 2023-05-30 DIAGNOSIS — K2970 Gastritis, unspecified, without bleeding: Secondary | ICD-10-CM | POA: Diagnosis not present

## 2023-05-30 DIAGNOSIS — K263 Acute duodenal ulcer without hemorrhage or perforation: Secondary | ICD-10-CM | POA: Diagnosis not present

## 2023-05-30 DIAGNOSIS — E7849 Other hyperlipidemia: Secondary | ICD-10-CM

## 2023-05-30 DIAGNOSIS — Z87891 Personal history of nicotine dependence: Secondary | ICD-10-CM | POA: Diagnosis not present

## 2023-05-30 DIAGNOSIS — K630 Abscess of intestine: Secondary | ICD-10-CM | POA: Diagnosis not present

## 2023-05-30 DIAGNOSIS — E669 Obesity, unspecified: Secondary | ICD-10-CM | POA: Diagnosis not present

## 2023-05-30 DIAGNOSIS — K2980 Duodenitis without bleeding: Principal | ICD-10-CM | POA: Diagnosis present

## 2023-05-30 DIAGNOSIS — K449 Diaphragmatic hernia without obstruction or gangrene: Secondary | ICD-10-CM | POA: Diagnosis not present

## 2023-05-30 DIAGNOSIS — R948 Abnormal results of function studies of other organs and systems: Secondary | ICD-10-CM | POA: Diagnosis not present

## 2023-05-30 DIAGNOSIS — E876 Hypokalemia: Secondary | ICD-10-CM | POA: Diagnosis not present

## 2023-05-30 DIAGNOSIS — I2510 Atherosclerotic heart disease of native coronary artery without angina pectoris: Secondary | ICD-10-CM | POA: Diagnosis not present

## 2023-05-30 DIAGNOSIS — Z880 Allergy status to penicillin: Secondary | ICD-10-CM | POA: Diagnosis not present

## 2023-05-30 DIAGNOSIS — R933 Abnormal findings on diagnostic imaging of other parts of digestive tract: Secondary | ICD-10-CM | POA: Diagnosis not present

## 2023-05-30 DIAGNOSIS — K2981 Duodenitis with bleeding: Secondary | ICD-10-CM | POA: Diagnosis not present

## 2023-05-30 DIAGNOSIS — K264 Chronic or unspecified duodenal ulcer with hemorrhage: Secondary | ICD-10-CM | POA: Diagnosis not present

## 2023-05-30 DIAGNOSIS — L259 Unspecified contact dermatitis, unspecified cause: Secondary | ICD-10-CM | POA: Diagnosis not present

## 2023-05-30 DIAGNOSIS — I700 Atherosclerosis of aorta: Secondary | ICD-10-CM | POA: Diagnosis not present

## 2023-05-30 DIAGNOSIS — D72829 Elevated white blood cell count, unspecified: Secondary | ICD-10-CM | POA: Diagnosis not present

## 2023-05-30 DIAGNOSIS — E785 Hyperlipidemia, unspecified: Secondary | ICD-10-CM | POA: Diagnosis not present

## 2023-05-30 LAB — TROPONIN I (HIGH SENSITIVITY)
Troponin I (High Sensitivity): 6 ng/L (ref <18)
Troponin I (High Sensitivity): 8 ng/L (ref <18)

## 2023-05-30 MED ORDER — CIPROFLOXACIN IN D5W 400 MG/200ML IV SOLN
400.0000 mg | Freq: Two times a day (BID) | INTRAVENOUS | Status: DC
  Administered 2023-05-30 – 2023-06-02 (×6): 400 mg via INTRAVENOUS
  Filled 2023-05-30 (×7): qty 200

## 2023-05-30 MED ORDER — MORPHINE SULFATE (PF) 4 MG/ML IV SOLN
4.0000 mg | Freq: Once | INTRAVENOUS | Status: AC
  Administered 2023-05-30: 4 mg via INTRAVENOUS
  Filled 2023-05-30: qty 1

## 2023-05-30 MED ORDER — HYDROCHLOROTHIAZIDE 12.5 MG PO TABS: 6.2500 mg | ORAL_TABLET | Freq: Every day | ORAL | Status: DC

## 2023-05-30 MED ORDER — ASPIRIN 81 MG PO TBEC
81.0000 mg | DELAYED_RELEASE_TABLET | Freq: Every day | ORAL | Status: DC
  Administered 2023-05-30 – 2023-06-01 (×3): 81 mg via ORAL
  Filled 2023-05-30 (×3): qty 1

## 2023-05-30 MED ORDER — CIPROFLOXACIN IN D5W 400 MG/200ML IV SOLN: 400.0000 mg | Freq: Two times a day (BID) | INTRAVENOUS | Status: DC

## 2023-05-30 MED ORDER — METRONIDAZOLE 500 MG/100ML IV SOLN
500.0000 mg | Freq: Once | INTRAVENOUS | Status: AC
  Administered 2023-05-30: 500 mg via INTRAVENOUS
  Filled 2023-05-30: qty 100

## 2023-05-30 MED ORDER — AMLODIPINE BESYLATE 10 MG PO TABS
10.0000 mg | ORAL_TABLET | Freq: Every day | ORAL | Status: DC
  Administered 2023-05-30 – 2023-06-02 (×4): 10 mg via ORAL
  Filled 2023-05-30 (×5): qty 1

## 2023-05-30 MED ORDER — SODIUM CHLORIDE 0.9% FLUSH: 3.0000 mL | INTRAVENOUS | Status: DC | PRN

## 2023-05-30 MED ORDER — BISOPROLOL FUMARATE 10 MG PO TABS
10.0000 mg | ORAL_TABLET | Freq: Every day | ORAL | Status: DC
  Administered 2023-05-30 – 2023-06-02 (×4): 10 mg via ORAL
  Filled 2023-05-30 (×4): qty 1

## 2023-05-30 MED ORDER — ENOXAPARIN SODIUM 40 MG/0.4ML IJ SOSY
40.0000 mg | PREFILLED_SYRINGE | INTRAMUSCULAR | Status: DC
  Administered 2023-05-30 – 2023-06-02 (×4): 40 mg via SUBCUTANEOUS
  Filled 2023-05-30 (×4): qty 0.4

## 2023-05-30 MED ORDER — PANTOPRAZOLE SODIUM 40 MG IV SOLR
40.0000 mg | Freq: Two times a day (BID) | INTRAVENOUS | Status: DC
  Administered 2023-05-30 (×2): 40 mg via INTRAVENOUS
  Filled 2023-05-30: qty 10

## 2023-05-30 MED ORDER — ONDANSETRON HCL 4 MG/2ML IJ SOLN
4.0000 mg | Freq: Four times a day (QID) | INTRAMUSCULAR | Status: DC | PRN
  Administered 2023-05-30: 4 mg via INTRAVENOUS
  Filled 2023-05-30: qty 2

## 2023-05-30 MED ORDER — FINASTERIDE 5 MG PO TABS
5.0000 mg | ORAL_TABLET | Freq: Every day | ORAL | Status: DC
  Administered 2023-05-30 – 2023-06-01 (×3): 5 mg via ORAL
  Filled 2023-05-30 (×3): qty 1

## 2023-05-30 MED ORDER — ONDANSETRON HCL 4 MG PO TABS: 4.0000 mg | ORAL_TABLET | Freq: Four times a day (QID) | ORAL | Status: DC | PRN

## 2023-05-30 MED ORDER — SENNOSIDES-DOCUSATE SODIUM 8.6-50 MG PO TABS: 1.0000 | ORAL_TABLET | Freq: Every evening | ORAL | Status: DC | PRN

## 2023-05-30 MED ORDER — ENOXAPARIN SODIUM 30 MG/0.3ML IJ SOSY: 30.0000 mg | PREFILLED_SYRINGE | INTRAMUSCULAR | Status: DC

## 2023-05-30 MED ORDER — SODIUM CHLORIDE 0.9% FLUSH
3.0000 mL | Freq: Two times a day (BID) | INTRAVENOUS | Status: DC
  Administered 2023-05-30 – 2023-05-31 (×3): 3 mL via INTRAVENOUS

## 2023-05-30 MED ORDER — HYDRALAZINE HCL 20 MG/ML IJ SOLN: 5.0000 mg | Freq: Four times a day (QID) | INTRAMUSCULAR | Status: DC | PRN

## 2023-05-30 MED ORDER — CIPROFLOXACIN IN D5W 400 MG/200ML IV SOLN
400.0000 mg | Freq: Once | INTRAVENOUS | Status: AC
  Administered 2023-05-30: 400 mg via INTRAVENOUS
  Filled 2023-05-30: qty 200

## 2023-05-30 MED ORDER — PANTOPRAZOLE SODIUM 40 MG IV SOLR: 40.0000 mg | INTRAVENOUS | Status: DC

## 2023-05-30 MED ORDER — ACETAMINOPHEN 650 MG RE SUPP: 650.0000 mg | Freq: Four times a day (QID) | RECTAL | Status: DC | PRN

## 2023-05-30 MED ORDER — ALUM & MAG HYDROXIDE-SIMETH 200-200-20 MG/5ML PO SUSP
15.0000 mL | Freq: Four times a day (QID) | ORAL | Status: DC | PRN
  Administered 2023-05-30: 15 mL via ORAL
  Filled 2023-05-30: qty 30

## 2023-05-30 MED ORDER — SODIUM CHLORIDE 0.45 % IV SOLN: INTRAVENOUS | Status: AC

## 2023-05-30 MED ORDER — BISOPROLOL-HYDROCHLOROTHIAZIDE 10-6.25 MG PO TABS: 1.0000 | ORAL_TABLET | Freq: Every day | ORAL | Status: DC

## 2023-05-30 MED ORDER — SODIUM CHLORIDE 0.9 % IV SOLN
250.0000 mL | INTRAVENOUS | Status: DC | PRN
  Administered 2023-05-30: 250 mL via INTRAVENOUS

## 2023-05-30 MED ORDER — METRONIDAZOLE 500 MG/100ML IV SOLN
500.0000 mg | Freq: Three times a day (TID) | INTRAVENOUS | Status: DC
  Administered 2023-05-30 – 2023-06-02 (×10): 500 mg via INTRAVENOUS
  Filled 2023-05-30 (×10): qty 100

## 2023-05-30 MED ORDER — ACETAMINOPHEN 325 MG PO TABS
650.0000 mg | ORAL_TABLET | Freq: Four times a day (QID) | ORAL | Status: DC | PRN
  Administered 2023-05-30: 650 mg via ORAL
  Filled 2023-05-30: qty 2

## 2023-05-30 NOTE — Consult Note (Signed)
Reason for Consult:ab pain Referring Physician: [name] is an 77 y.o. male.  HPI: 66 yom with history back surgery, htn who presents with one month of some intermittent epigastric pain and right sided pain.  They have been on a trip and would notice frequently associated with bloating as well. Took some Tums.  He has history when in [employer] of what sounds like a pH probe (this was 50 years ago) but he states has never had an ulcer or other GI issues.  The pain got much worse and he had episodes of emesis yesterday prompting visit to the ER.  No fevers.  Food was not making it worse.  Found to have wbc of 11.4. CT with duodenitis, difficult to tell if ulcer small intramural abscess  He is with his wife when I see him.  He has some mild pain.   Past Medical History:  Diagnosis Date   Arthritis    BPH (benign prostatic hyperplasia)    Chronic back pain    Family history of adverse reaction to anesthesia    father died during carotid artery surgery   GERD (gastroesophageal reflux disease)    Hyperlipidemia    Hypertension    Pneumonia    Spinal stenosis     Past Surgical History:  Procedure Laterality Date   COLONOSCOPY     FINGER SURGERY     to remove a tumor from right little finger   LAMINECTOMY WITH POSTERIOR LATERAL ARTHRODESIS LEVEL 2 N/A 08/01/2018   Procedure: LUMBAR THREE- LUMBAR FOUR, LUMBAR FOUR-LUMBAR FIVE DECOMPRESSION, LUMBAR THREE-LUMBAR FOUR, LUMBAR FOUR-LUMBAR FIVE POSTEROLATERAL ARTHRODESIS;  Surgeon: Shirlean Kelly, MD;  Location: [facility] OR;  Service: Neurosurgery;  Laterality: N/A;   LEFT HEART CATH AND CORONARY ANGIOGRAPHY N/A 08/27/2021   Procedure: LEFT HEART CATH AND CORONARY ANGIOGRAPHY;  Surgeon: Tonny Bollman, MD;  Location: [facility] INVASIVE CV LAB;  Service: Cardiovascular;  Laterality: N/A;    Family History  Problem Relation Age of Onset   Hypertension Mother     Social History:  reports that he has quit smoking. He has never used smokeless  tobacco. He reports that he does not drink alcohol and does not use drugs.  Allergies:  Allergies  Allergen Reactions   Atorvastatin Other (See Comments)    MUSCLE ACHES  Other reaction(s): muscle aches   Ezetimibe Other (See Comments)    ACHES  Other reaction(s): aches   Zocor [Simvastatin] Other (See Comments)    ACHES   Amlodipine     Other reaction(s): swelling (mild) Takes amlodipine at home 05/30/23   Crestor [Rosuvastatin Calcium]     Joint and muscle pain   Other Rash    FISH    Penicillins Rash    Medications: Prior to Admission:  Facility-Administered Medications Prior to Admission  Medication Dose Route Frequency Provider Last Rate Last Admin   sodium chloride flush (NS) 0.9 % injection 3 mL  3 mL Intravenous Q12H Lewayne Bunting, MD       Medications Prior to Admission  Medication Sig Dispense Refill Last Dose   acetaminophen (TYLENOL) 650 MG CR tablet Take 1,300 mg by mouth every 8 (eight) hours as needed for pain.      amLODipine (NORVASC) 10 MG tablet Take 10 mg by mouth daily.      aspirin EC 81 MG tablet Take 81 mg by mouth at bedtime. Swallow whole.      bisoprolol-hydrochlorothiazide (ZIAC) 10-6.25 MG per tablet Take 1 tablet by  mouth daily.      Evolocumab (REPATHA SURECLICK) 140 MG/ML SOAJ Inject 140 mg into the skin every 14 (fourteen) days. 2 mL 11    finasteride (PROSCAR) 5 MG tablet Take 5 mg by mouth at bedtime.      fluocinonide cream (LIDEX) 0.05 % Apply 1 application. topically daily as needed (itchy ears).      ibuprofen (ADVIL) 200 MG tablet Take 400 mg by mouth 2 (two) times daily as needed (pain.).      Polyethyl Glycol-Propyl Glycol (LUBRICANT EYE DROPS) 0.4-0.3 % SOLN Place 1-2 drops into both eyes 3 (three) times daily as needed (dry/irritated eyes.). (Patient not taking: Reported on 08/17/2022)       Results for orders placed or performed during the hospital encounter of 05/29/23 (from the past 48 hour(s))  Lipase, blood     Status:  None   Collection Time: 05/29/23  8:09 PM  Result Value Ref Range   Lipase 28 11 - 51 U/L    Comment: Performed at [facility], [address], [place], [place] [address]  Comprehensive metabolic panel     Status: Abnormal   Collection Time: 05/29/23  8:09 PM  Result Value Ref Range   Sodium 141 135 - 145 mmol/L   Potassium 3.6 3.5 - 5.1 mmol/L   Chloride 98 98 - 111 mmol/L   CO2 32 22 - 32 mmol/L   Glucose, Bld 148 (H) 70 - 99 mg/dL    Comment: Glucose reference range applies only to samples taken after fasting for at least 8 hours.   BUN 32 (H) 8 - 23 mg/dL   Creatinine, Ser 4.09 0.61 - 1.24 mg/dL   Calcium 9.6 8.9 - 81.1 mg/dL   Total Protein 7.0 6.5 - 8.1 g/dL   Albumin 4.2 3.5 - 5.0 g/dL   AST 13 (L) 15 - 41 U/L   ALT 16 0 - 44 U/L   Alkaline Phosphatase 35 (L) 38 - 126 U/L   Total Bilirubin 0.5 0.3 - 1.2 mg/dL   GFR, Estimated >91 >47 mL/min    Comment: (NOTE) Calculated using the CKD-EPI Creatinine Equation (2021)    Anion gap 11 5 - 15    Comment: Performed at [facility], [address] [place], [place] [address]  CBC     Status: Abnormal   Collection Time: 05/29/23  8:09 PM  Result Value Ref Range   WBC 11.4 (H) 4.0 - 10.5 K/uL   RBC 5.47 4.22 - 5.81 MIL/uL   Hemoglobin 14.7 13.0 - 17.0 g/dL   HCT 21.3 08.6 - 57.8 %   MCV 81.5 80.0 - 100.0 fL   MCH 26.9 26.0 - 34.0 pg   MCHC 33.0 30.0 - 36.0 g/dL   RDW 46.9 62.9 - 52.8 %   Platelets 234 150 - 400 K/uL   nRBC 0.0 0.0 - 0.2 %    Comment: Performed at [facility], [address] [place], [place] [address]  Urinalysis, Routine w reflex microscopic -Urine, Clean Catch     Status: Abnormal   Collection Time: 05/29/23  8:09 PM  Result Value Ref Range   Color, Urine YELLOW YELLOW   APPearance CLEAR CLEAR   Specific Gravity, Urine 1.022 1.005 - 1.030   pH 6.0 5.0 - 8.0   Glucose, UA 500 (A) NEGATIVE mg/dL   Hgb urine dipstick NEGATIVE NEGATIVE    Bilirubin Urine NEGATIVE NEGATIVE   Ketones, ur NEGATIVE NEGATIVE mg/dL   Protein, ur TRACE (  A) NEGATIVE mg/dL   Nitrite NEGATIVE NEGATIVE   Leukocytes,Ua NEGATIVE NEGATIVE   RBC / HPF 0-5 0 - 5 RBC/hpf   WBC, UA 0-5 0 - 5 WBC/hpf   Bacteria, UA NONE SEEN NONE SEEN   Squamous Epithelial / HPF 0-5 0 - 5 /HPF   Mucus PRESENT    Hyaline Casts, UA PRESENT     Comment: Performed at [facility], [address], [place], [place] [address]  Troponin I (High Sensitivity)     Status: None   Collection Time: 05/29/23 11:35 PM  Result Value Ref Range   Troponin I (High Sensitivity) 6 <18 ng/L    Comment: (NOTE) Elevated high sensitivity troponin I (hsTnI) values and significant  changes across serial measurements may suggest ACS but many other  chronic and acute conditions are known to elevate hsTnI results.  Refer to the "Links" section for chest pain algorithms and additional  guidance. Performed at [facility], [address] [place], [place] [address]     CT ABDOMEN PELVIS W CONTRAST  Result Date: 05/30/2023 CLINICAL DATA:  Abdominal pain EXAM: CT ABDOMEN AND PELVIS WITH CONTRAST TECHNIQUE: Multidetector CT imaging of the abdomen and pelvis was performed using the standard protocol following bolus administration of intravenous contrast. RADIATION DOSE REDUCTION: This exam was performed according to the departmental dose-optimization program which includes automated exposure control, adjustment of the mA and/or kV according to patient size and/or use of iterative reconstruction technique. CONTRAST:  90mL OMNIPAQUE IOHEXOL 350 MG/ML SOLN COMPARISON:  None Available. FINDINGS: Lower chest: No acute abnormality Hepatobiliary: No suspicious focal hepatic abnormality. Gallbladder unremarkable. No biliary ductal dilatation. Pancreas: No focal abnormality or ductal dilatation. Spleen: No focal abnormality.  Normal size. Adrenals/Urinary Tract: 3.7 cm simple  appearing cyst in the midpole of the right kidney. Smaller cysts bilaterally. No follow-up imaging recommended. No stones or hydronephrosis. Adrenal glands and urinary bladder unremarkable. Stomach/Bowel: Diffuse colonic diverticulosis. No active diverticulitis. Normal appendix. There is mild wall thickening and surrounding inflammation involving the proximal duodenum concerning for duodenitis. There appears to be a small fluid collection with locule of gas within the wall which could reflect a small intramural abscess or ulcer. Stomach is moderately distended and fluid-filled. Vascular/Lymphatic: Aortic atherosclerosis. No evidence of aneurysm or adenopathy. Reproductive: No visible focal abnormality. Other: No free fluid or free air. Musculoskeletal: No acute bony abnormality. IMPRESSION: Wall thickening and surrounding inflammatory change involving the proximal duodenum compatible with duodenitis. Possible ulcer versus intramural abscess seen within the first or second portion of the duodenum. Mild distention of the stomach with fluid. Colonic diverticulosis.  No active diverticulitis. Aortic atherosclerosis. Electronically Signed   By: Charlett Nose M.D.   On: 05/30/2023 00:14    Review of Systems  Constitutional:  Negative for fever.  Gastrointestinal:  Positive for abdominal pain, nausea and vomiting.  All other systems reviewed and are negative.  Blood pressure (!) 145/84, pulse (!) 53, temperature 97.9 F (36.6 C), temperature source Oral, resp. rate 17, height 5\' 9"  (1.753 m), weight 95.3 kg, SpO2 99%. Physical Exam Vitals reviewed.  Constitutional:      Appearance: He is well-developed.  Eyes:     General: No scleral icterus. Cardiovascular:     Rate and Rhythm: Normal rate and regular rhythm.  Pulmonary:     Effort: Pulmonary effort is normal.  Abdominal:     Palpations: Abdomen is soft.     Tenderness: Tenderness: very mild. Negative signs include Murphy's sign.  Neurological:  General: No focal deficit present.     Mental Status: He is alert.  Psychiatric:        Mood and Affect: Mood normal.        Behavior: Behavior normal.     Assessment/Plan: Duodenitis - no indication for surgery -agree with PPI and abx (PCN allergy as infant noted but cipro/flagyl likely not best choice and would recommend something different) -would recommend GI consult as ultimately he is going to need and endoscopy to figure this out -pharm dvt proph fine with me -would leave npo for now until GI can see him   Ruben Rodriguez 05/30/2023, 9:37 AM

## 2023-05-30 NOTE — ED Notes (Signed)
[facility] notified of pt's ready bed status, they have a truck in route at this time

## 2023-05-30 NOTE — Plan of Care (Signed)

## 2023-05-30 NOTE — Consult Note (Signed)
Referring Provider: Dr. Rito Ehrlich Primary Care Physician:  Lupita Raider, MD Primary Gastroenterologist:  Dr. Levora Angel  Reason for Consultation:  Epigastric pain; Abnormal CT scan  HPI: Ruben Rodriguez is a 77 y.o. male with onset of epigastric and RUQ pain for the past 3 weeks that has progressed to severe constant epigastric pain along with severe nausea and vomiting yesterday. Has intermittent heartburn that increased yesterday. Vomiting was bilious. Denies black or red vomitus. Denies black or red stools. Denies weight loss. CT showed wall thickening and inflammation of the proximal duodenum. For the past 2-3 weeks he has been taking one dose of Diclofenac per day and took Advil instead recently while on a 12 day road trip. Prior to 3 weeks ago would take Diclofenac prn for back pain. Denies other NSAIDs. No known history of peptic ulcer disease. Reports an EGD 50 years ago by [employer] prior to [place] war but results not known. Colonoscopy in 2021 by Dr. Levora Angel showed diverticulosis and internal hemorrhoids. Wife at bedside.  Past Medical History:  Diagnosis Date   Arthritis    BPH (benign prostatic hyperplasia)    Chronic back pain    Family history of adverse reaction to anesthesia    father died during carotid artery surgery   GERD (gastroesophageal reflux disease)    Hyperlipidemia    Hypertension    Pneumonia    Spinal stenosis     Past Surgical History:  Procedure Laterality Date   COLONOSCOPY     FINGER SURGERY     to remove a tumor from right little finger   LAMINECTOMY WITH POSTERIOR LATERAL ARTHRODESIS LEVEL 2 N/A 08/01/2018   Procedure: LUMBAR THREE- LUMBAR FOUR, LUMBAR FOUR-LUMBAR FIVE DECOMPRESSION, LUMBAR THREE-LUMBAR FOUR, LUMBAR FOUR-LUMBAR FIVE POSTEROLATERAL ARTHRODESIS;  Surgeon: Shirlean Kelly, MD;  Location: [facility] OR;  Service: Neurosurgery;  Laterality: N/A;   LEFT HEART CATH AND CORONARY ANGIOGRAPHY N/A 08/27/2021   Procedure: LEFT HEART CATH AND CORONARY  ANGIOGRAPHY;  Surgeon: Tonny Bollman, MD;  Location: [facility] INVASIVE CV LAB;  Service: Cardiovascular;  Laterality: N/A;    Prior to Admission medications   Medication Sig Start Date End Date Taking? Authorizing Provider  acetaminophen (TYLENOL) 650 MG CR tablet Take 1,300 mg by mouth every 8 (eight) hours as needed for pain.    [provider]  amLODipine (NORVASC) 10 MG tablet Take 10 mg by mouth daily.    [provider]  aspirin EC 81 MG tablet Take 81 mg by mouth at bedtime. Swallow whole.    [provider]  bisoprolol-hydrochlorothiazide (ZIAC) 10-6.25 MG per tablet Take 1 tablet by mouth daily.    [provider]  diclofenac (VOLTAREN) 75 MG EC tablet Take 75 mg by mouth 2 (two) times daily as needed.    [provider]  Evolocumab (REPATHA SURECLICK) 140 MG/ML SOAJ Inject 140 mg into the skin every 14 (fourteen) days. 09/29/22   Lewayne Bunting, MD  finasteride (PROSCAR) 5 MG tablet Take 5 mg by mouth at bedtime.    [provider]  fluocinonide cream (LIDEX) 0.05 % Apply 1 application. topically daily as needed (itchy ears).    [provider]  ibuprofen (ADVIL) 200 MG tablet Take 400 mg by mouth 2 (two) times daily as needed (pain.).    [provider]  Polyethyl Glycol-Propyl Glycol (LUBRICANT EYE DROPS) 0.4-0.3 % SOLN Place 1-2 drops into both eyes 3 (three) times daily as needed (dry/irritated eyes.). Patient not taking: Reported on 08/17/2022  [provider]    Scheduled Meds:  amLODipine  10 mg Oral Daily   aspirin EC  81 mg Oral QHS   bisoprolol  10 mg Oral Daily   enoxaparin (LOVENOX) injection  40 mg Subcutaneous Q24H   finasteride  5 mg Oral QHS   pantoprazole (PROTONIX) IV  40 mg Intravenous Q12H   sodium chloride flush  3 mL Intravenous Q12H   Continuous Infusions:  sodium chloride Stopped (05/30/23 1121)   sodium chloride 250 mL (05/30/23 0930)   ciprofloxacin 400 mg (05/30/23 1300)    metronidazole 500 mg (05/30/23 0930)   PRN Meds:.sodium chloride, acetaminophen **OR** acetaminophen, alum & mag hydroxide-simeth, hydrALAZINE, ondansetron **OR** ondansetron (ZOFRAN) IV, senna-docusate, sodium chloride flush  Allergies as of 05/29/2023 - Review Complete 05/29/2023  Allergen Reaction Noted   Atorvastatin Other (See Comments) 04/14/2014   Ezetimibe Other (See Comments) 04/14/2014   Zocor [simvastatin] Other (See Comments) 04/14/2014   Amlodipine  11/25/2021   Crestor [rosuvastatin calcium]  01/04/2022   Other Rash 04/14/2014   Penicillins Rash 04/14/2014    Family History  Problem Relation Age of Onset   Hypertension Mother     Social History   Socioeconomic History   Marital status: Married    Spouse name: Not on file   Number of children: 2   Years of education: Not on file   Highest education level: Not on file  Occupational History   Not on file  Tobacco Use   Smoking status: Former   Smokeless tobacco: Never  Vaping Use   Vaping status: Never Used  Substance and Sexual Activity   Alcohol use: No    Comment: Occasional   Drug use: No   Sexual activity: Not on file  Other Topics Concern   Not on file  Social History Narrative   Not on file   Social Determinants of Health   Financial Resource Strain: Not on file  Food Insecurity: Not on file  Transportation Needs: Not on file  Physical Activity: Not on file  Stress: Not on file  Social Connections: Not on file  Intimate Partner Violence: Not on file    Review of Systems: All negative except as stated above in HPI.  Physical Exam: Vital signs: Vitals:   05/30/23 1223 05/30/23 1500  BP: 117/77 128/81  Pulse:  (!) 55  Resp: 18 18  Temp: 98.2 F (36.8 C) 97.8 F (36.6 C)  SpO2: 97% 96%   Last BM Date : 05/28/23 General:  Lethargic, elderly, Well-developed, well-nourished, pleasant and cooperative in NAD Head: normocephalic, atraumatic Eyes: anicteric sclera ENT: oropharynx  clear Neck: supple, nontender Lungs:  Clear throughout to auscultation.   No wheezes, crackles, or rhonchi. No acute distress. Heart:  Regular rate and rhythm; no murmurs, clicks, rubs,  or gallops. Abdomen: epigastric tenderness with guarding, otherwise nontender, soft, nondistended, +BS  Rectal:  Deferred Ext: no edema  GI:  Lab Results: Recent Labs    05/29/23 2009  WBC 11.4*  HGB 14.7  HCT 44.6  PLT 234   BMET Recent Labs    05/29/23 2009  NA 141  K 3.6  CL 98  CO2 32  GLUCOSE 148*  BUN 32*  CREATININE 1.18  CALCIUM 9.6   LFT Recent Labs    05/29/23 2009  PROT 7.0  ALBUMIN 4.2  AST 13*  ALT 16  ALKPHOS 35*  BILITOT 0.5   PT/INR No results for input(s): "LABPROT", "INR" in the last 72 hours.   Studies/Results:  CT ABDOMEN PELVIS W CONTRAST  Result Date: 05/30/2023 CLINICAL DATA:  Abdominal pain EXAM: CT ABDOMEN AND PELVIS WITH CONTRAST TECHNIQUE: Multidetector CT imaging of the abdomen and pelvis was performed using the standard protocol following bolus administration of intravenous contrast. RADIATION DOSE REDUCTION: This exam was performed according to the departmental dose-optimization program which includes automated exposure control, adjustment of the mA and/or kV according to patient size and/or use of iterative reconstruction technique. CONTRAST:  90mL OMNIPAQUE IOHEXOL 350 MG/ML SOLN COMPARISON:  None Available. FINDINGS: Lower chest: No acute abnormality Hepatobiliary: No suspicious focal hepatic abnormality. Gallbladder unremarkable. No biliary ductal dilatation. Pancreas: No focal abnormality or ductal dilatation. Spleen: No focal abnormality.  Normal size. Adrenals/Urinary Tract: 3.7 cm simple appearing cyst in the midpole of the right kidney. Smaller cysts bilaterally. No follow-up imaging recommended. No stones or hydronephrosis. Adrenal glands and urinary bladder unremarkable. Stomach/Bowel: Diffuse colonic diverticulosis. No active diverticulitis.  Normal appendix. There is mild wall thickening and surrounding inflammation involving the proximal duodenum concerning for duodenitis. There appears to be a small fluid collection with locule of gas within the wall which could reflect a small intramural abscess or ulcer. Stomach is moderately distended and fluid-filled. Vascular/Lymphatic: Aortic atherosclerosis. No evidence of aneurysm or adenopathy. Reproductive: No visible focal abnormality. Other: No free fluid or free air. Musculoskeletal: No acute bony abnormality. IMPRESSION: Wall thickening and surrounding inflammatory change involving the proximal duodenum compatible with duodenitis. Possible ulcer versus intramural abscess seen within the first or second portion of the duodenum. Mild distention of the stomach with fluid. Colonic diverticulosis.  No active diverticulitis. Aortic atherosclerosis. Electronically Signed   By: Charlett Nose M.D.   On: 05/30/2023 00:14    Impression/Plan: Epigastric pain with N/V and recent NSAID use concerning for duodenal ulcer with inflammation and wall thickening of proximal duodenum. EGD tomorrow morning to further evaluate proximal duodenitis. Continue IV Protonix. Clear liquid diet. NPO p MN. Supportive care.    LOS: 0 days   Shirley Friar  05/30/2023, 3:42 PM  Questions please call [phone]

## 2023-05-30 NOTE — Progress Notes (Deleted)
Pharmacy Antibiotic Note  Ruben Rodriguez is a 77 y.o. male admitted on 05/29/2023 with duodenitis w/ ulcer vs intramural abscess.

## 2023-05-30 NOTE — Progress Notes (Addendum)
Patient was admitted earlier this morning.  H&P reviewed.  Patient seen and examined.  Patient feels much better this morning compared to yesterday.  Abdominal pain is much better.  No further nausea or vomiting.  Vital signs reviewed.  Slightly elevated blood pressure noted.  Other vital signs are stable.  Lungs are clear to auscultation bilaterally. S1-S2 is normal regular. Abdomen is soft.  Nontender nondistended.  Labs from last night reviewed.  Labs have been ordered for tomorrow morning.  Patient admitted with duodenitis with concern for abscess in that area.  Seen by general surgery.  No indication for surgical intervention currently.  Patient seems to have improved with pain medications and hydration.  Currently on ciprofloxacin and metronidazole.  General surgery recommending GI input.  [facility] gastroenterology has been consulted, Dr. Bosie Clos.  Surgeon recommending alternative antibiotics such as cephalosporin.  Patient had severe anaphylactic reaction to penicillin.  Would not use cephalosporin.  Patient is hesitant as well. Discussed with pharmacy.  No other reasonable option available.  Cipro should cover this infection.  He is clinically improving.  Will leave him on Cipro and Flagyl for now.  Discussed with patient as well.  Other issues as per H&P.  This is a no charge note.  Ruben Rodriguez 05/30/2023

## 2023-05-30 NOTE — H&P (Signed)
History and Physical    Torrell Terpak [facility] QVZ:[id number] DOB: 05/17/1946 DOA: 05/29/2023  PCP: Lupita Raider, MD   Patient coming from: Home   Chief Complaint:  Chief Complaint  Patient presents with   Abdominal Pain    HPI:  ADELL ESHBACH is a 77 y.o. male with medical history significant of hyperlipidemia, CAD on medical management with aspirin and statin, essential hypertension and BPH present to emergency department complaining of abdominal pain.  Patient reported he has 2 to 3 weeks of epigastric and right lower quadrant abdominal pain.  Reported earlier in the afternoon did after his lunch he has few episodes of vomiting where he felt like that a lot of gastric acid and he was experiencing a lot of heartburn.  Patient denies any use of over-the-counter ibuprofen, Goody powder, BC powder, and Motrin. Patient reported previous history of EGD 1968 however unsure about the finding.    ED Course:  At presentation to ED patient blood pressure 154/100, heart rate 79, respiratory 16 and O2 sat 96% room air. Lipase 28. CBC showed sodium 141, potassium 3.8, chloride 98, bicarb 32, elevated blood glucose 148, BUN 32, creatinine 1.1, calcium 9.6, low AST 13, alkaline phosphatase low 35, bilirubin, GFR above 60 anion gap 11. CBC showed some leukocytosis 11.4, hemoglobin 14.1 and platelet 234. UA showed blood glucose 500 otherwise unremarkable.  Troponin 6.  EKG showed normal sinus rhythm heart rate 79.  No ST and T wave abnormality.  CT abdomen pelvis showed Wall thickening and surrounding inflammatory change involving the proximal duodenum compatible with duodenitis. Possible ulcer versus intramural abscess seen within the first or second portion of the duodenum. Mild distention of the stomach with fluid. Colonic diverticulosis.  No active diverticulitis. Aortic atherosclerosis.  ED physician consult case with general surgery Dr. Bedelia Person.  No surgical intervention needed at this time I  recommend continue medical management.  Per general surgery there is a possibility of small uterine abscess.  Recommended to hold GI consult at this time.  In the ED patient received ciprofloxacin, metronidazole, famotidine IV once and morphine once. Of note, patient is allergic to penicillin. Hospitalist has been contacted for admission.  Review of Systems:  Review of Systems  Constitutional:  Negative for chills, fever, malaise/fatigue and weight loss.  Respiratory:  Negative for cough and shortness of breath.   Cardiovascular:  Negative for chest pain, palpitations and leg swelling.  Gastrointestinal:  Positive for abdominal pain, heartburn, nausea and vomiting. Negative for constipation and diarrhea.  Genitourinary:  Negative for dysuria.  Musculoskeletal:  Negative for myalgias and neck pain.  Skin:  Negative for rash.  Neurological:  Negative for dizziness and headaches.  Psychiatric/Behavioral:  The patient is not nervous/anxious.     Past Medical History:  Diagnosis Date   Arthritis    BPH (benign prostatic hyperplasia)    Chronic back pain    Family history of adverse reaction to anesthesia    father died during carotid artery surgery   GERD (gastroesophageal reflux disease)    Hyperlipidemia    Hypertension    Pneumonia    Spinal stenosis     Past Surgical History:  Procedure Laterality Date   COLONOSCOPY     FINGER SURGERY     to remove a tumor from right little finger   LAMINECTOMY WITH POSTERIOR LATERAL ARTHRODESIS LEVEL 2 N/A 08/01/2018   Procedure: LUMBAR THREE- LUMBAR FOUR, LUMBAR FOUR-LUMBAR FIVE DECOMPRESSION, LUMBAR THREE-LUMBAR FOUR, LUMBAR FOUR-LUMBAR FIVE POSTEROLATERAL ARTHRODESIS;  Surgeon: Newell Coral,  Molly Maduro, MD;  Location: [facility] OR;  Service: Neurosurgery;  Laterality: N/A;   LEFT HEART CATH AND CORONARY ANGIOGRAPHY N/A 08/27/2021   Procedure: LEFT HEART CATH AND CORONARY ANGIOGRAPHY;  Surgeon: Tonny Bollman, MD;  Location: [facility] INVASIVE CV LAB;  Service:  Cardiovascular;  Laterality: N/A;     reports that he has quit smoking. He has never used smokeless tobacco. He reports that he does not drink alcohol and does not use drugs.  Allergies  Allergen Reactions   Atorvastatin Other (See Comments)    MUSCLE ACHES  Other reaction(s): muscle aches   Ezetimibe Other (See Comments)    ACHES  Other reaction(s): aches   Zocor [Simvastatin] Other (See Comments)    ACHES   Amlodipine     Other reaction(s): swelling (mild) Takes amlodipine at home 05/30/23   Crestor [Rosuvastatin Calcium]     Joint and muscle pain   Other Rash    FISH    Penicillins Rash    Family History  Problem Relation Age of Onset   Hypertension Mother     Prior to Admission medications   Medication Sig Start Date End Date Taking? Authorizing Provider  acetaminophen (TYLENOL) 650 MG CR tablet Take 1,300 mg by mouth every 8 (eight) hours as needed for pain.    [provider]  amLODipine (NORVASC) 10 MG tablet Take 10 mg by mouth daily.    [provider]  aspirin EC 81 MG tablet Take 81 mg by mouth at bedtime. Swallow whole.    [provider]  bisoprolol-hydrochlorothiazide (ZIAC) 10-6.25 MG per tablet Take 1 tablet by mouth daily.    [provider]  Evolocumab (REPATHA SURECLICK) 140 MG/ML SOAJ Inject 140 mg into the skin every 14 (fourteen) days. 09/29/22   Lewayne Bunting, MD  finasteride (PROSCAR) 5 MG tablet Take 5 mg by mouth at bedtime.    [provider]  fluocinonide cream (LIDEX) 0.05 % Apply 1 application. topically daily as needed (itchy ears).    [provider]  ibuprofen (ADVIL) 200 MG tablet Take 400 mg by mouth 2 (two) times daily as needed (pain.).    [provider]  Polyethyl Glycol-Propyl Glycol (LUBRICANT EYE DROPS) 0.4-0.3 % SOLN Place 1-2 drops into both eyes 3 (three) times daily as needed (dry/irritated eyes.). Patient not taking: Reported on 08/17/2022    [provider]     Physical Exam: Vitals:   05/29/23 2006 05/30/23 0003 05/30/23 0005 05/30/23 0321  BP: (!) 154/100 (!) 149/90  (!) 142/91  Pulse: 79 76  (!) 54  Resp: 16 18  18   Temp: 98.6 F (37 C)  98.4 F (36.9 C) 97.9 F (36.6 C)  TempSrc: Oral  Oral Oral  SpO2: 96% 91%  98%  Weight:      Height:        Physical Exam Eyes:     Extraocular Movements: Extraocular movements intact.  Cardiovascular:     Rate and Rhythm: Normal rate and regular rhythm.  Pulmonary:     Effort: Pulmonary effort is normal.     Breath sounds: Normal breath sounds.  Abdominal:     General: Abdomen is protuberant. Bowel sounds are normal. There is no distension.     Palpations: Abdomen is soft. There is no mass.     Tenderness: There is no abdominal tenderness. There is no guarding. Negative signs include Murphy's sign.     Hernia: No hernia is present.  Skin:    General:  Skin is dry.     Capillary Refill: Capillary refill takes less than 2 seconds.  Neurological:     Mental Status: He is alert and oriented to person, place, and time.  Psychiatric:        Mood and Affect: Mood normal. Mood is not anxious.      Labs on Admission: I have personally reviewed following labs and imaging studies  CBC: Recent Labs  Lab 05/29/23 2009  WBC 11.4*  HGB 14.7  HCT 44.6  MCV 81.5  PLT 234   Basic Metabolic Panel: Recent Labs  Lab 05/29/23 2009  NA 141  K 3.6  CL 98  CO2 32  GLUCOSE 148*  BUN 32*  CREATININE 1.18  CALCIUM 9.6   GFR: Estimated Creatinine Clearance: 60.6 mL/min (by C-G formula based on SCr of 1.18 mg/dL). Liver Function Tests: Recent Labs  Lab 05/29/23 2009  AST 13*  ALT 16  ALKPHOS 35*  BILITOT 0.5  PROT 7.0  ALBUMIN 4.2   Recent Labs  Lab 05/29/23 2009  LIPASE 28   No results for input(s): "AMMONIA" in the last 168 hours. Coagulation Profile: No results for input(s): "INR", "PROTIME" in the last 168 hours. Cardiac Enzymes: Recent Labs  Lab 05/29/23 2335   TROPONINIHS 6   BNP (last 3 results) No results for input(s): "BNP" in the last 8760 hours. HbA1C: No results for input(s): "HGBA1C" in the last 72 hours. CBG: No results for input(s): "GLUCAP" in the last 168 hours. Lipid Profile: No results for input(s): "CHOL", "HDL", "LDLCALC", "TRIG", "CHOLHDL", "LDLDIRECT" in the last 72 hours. Thyroid Function Tests: No results for input(s): "TSH", "T4TOTAL", "FREET4", "T3FREE", "THYROIDAB" in the last 72 hours. Anemia Panel: No results for input(s): "VITAMINB12", "FOLATE", "FERRITIN", "TIBC", "IRON", "RETICCTPCT" in the last 72 hours. Urine analysis:    Component Value Date/Time   COLORURINE YELLOW 05/29/2023 2009   APPEARANCEUR CLEAR 05/29/2023 2009   LABSPEC 1.022 05/29/2023 2009   PHURINE 6.0 05/29/2023 2009   GLUCOSEU 500 (A) 05/29/2023 2009   HGBUR NEGATIVE 05/29/2023 2009   BILIRUBINUR NEGATIVE 05/29/2023 2009   KETONESUR NEGATIVE 05/29/2023 2009   PROTEINUR TRACE (A) 05/29/2023 2009   NITRITE NEGATIVE 05/29/2023 2009   LEUKOCYTESUR NEGATIVE 05/29/2023 2009    Radiological Exams on Admission: I have personally reviewed images CT ABDOMEN PELVIS W CONTRAST  Result Date: 05/30/2023 CLINICAL DATA:  Abdominal pain EXAM: CT ABDOMEN AND PELVIS WITH CONTRAST TECHNIQUE: Multidetector CT imaging of the abdomen and pelvis was performed using the standard protocol following bolus administration of intravenous contrast. RADIATION DOSE REDUCTION: This exam was performed according to the departmental dose-optimization program which includes automated exposure control, adjustment of the mA and/or kV according to patient size and/or use of iterative reconstruction technique. CONTRAST:  90mL OMNIPAQUE IOHEXOL 350 MG/ML SOLN COMPARISON:  None Available. FINDINGS: Lower chest: No acute abnormality Hepatobiliary: No suspicious focal hepatic abnormality. Gallbladder unremarkable. No biliary ductal dilatation. Pancreas: No focal abnormality or ductal  dilatation. Spleen: No focal abnormality.  Normal size. Adrenals/Urinary Tract: 3.7 cm simple appearing cyst in the midpole of the right kidney. Smaller cysts bilaterally. No follow-up imaging recommended. No stones or hydronephrosis. Adrenal glands and urinary bladder unremarkable. Stomach/Bowel: Diffuse colonic diverticulosis. No active diverticulitis. Normal appendix. There is mild wall thickening and surrounding inflammation involving the proximal duodenum concerning for duodenitis. There appears to be a small fluid collection with locule of gas within the wall which could reflect a small intramural abscess or ulcer. Stomach is moderately distended  and fluid-filled. Vascular/Lymphatic: Aortic atherosclerosis. No evidence of aneurysm or adenopathy. Reproductive: No visible focal abnormality. Other: No free fluid or free air. Musculoskeletal: No acute bony abnormality. IMPRESSION: Wall thickening and surrounding inflammatory change involving the proximal duodenum compatible with duodenitis. Possible ulcer versus intramural abscess seen within the first or second portion of the duodenum. Mild distention of the stomach with fluid. Colonic diverticulosis.  No active diverticulitis. Aortic atherosclerosis. Electronically Signed   By: Charlett Nose M.D.   On: 05/30/2023 00:14    EKG: My personal interpretation of EKG shows: EG showing normal sinus rhythm heart rate 79.  There is no ST anterior abnormality.    Assessment/Plan: Principal Problem:   Duodenal abscess Active Problems:   Duodenitis   BPH (benign prostatic hyperplasia)   Essential hypertension   Hyperlipidemia   History of CAD (coronary artery disease)   Assessment and Plan: Duodenal abscess Duodenitis -Patient presenting with complaining of midepigastric abdominal pain for last 2 to 3 weeks and he had 1 episode of vomiting earlier in the afternoon today when he experienced a lot of acid coming out with his food.  He also experiencing  heartburn as well. -Patient denies any fever and chills.  He is afebrile.  Has slight leukocytosis 11.5. - Normal lipase level. - CT abdomen pelvis showed Wall thickening and surrounding inflammatory change involving the proximal duodenum compatible with duodenitis. Possible ulcer versus intramural abscess seen within the first or second portion of the duodenum. Mild distention of the stomach with fluid. -With the concern for jejunal abscess EDP physician consulted general surgery Dr. Bedelia Person, per general surgery there is concern for some small the renal abscess and recommended to treat with medically and no need for surgical intervention at this time.  Recommended to hold off GI consult at this time.  GS recommended treat with Zosyn.   -However patient allergic to penicillin that is why in the ED he has been treated empirically with ciprofloxacin and metronidazole - Obtaining blood culture.  Checking fecal H. pylori antigen test. - Continue broad-spectrum antibiotic coverage with IV ciprofloxacin and IV metronidazole. - Continue to treat with IV Protonix 40 mg twice daily and Maalox as needed. -Resumed diet.  History of essential hypertension -Resume home blood pressure regimen amlodipine 10 mg daily and bisoprolol-hydrochlorothiazide combination 10-6.25 mg daily. -Continue hydralazine as needed.  History of CAD on medical management -No chest pain. Patient has history of CAD on medical management with aspirin.  Last seen cardiology on 07/2022.  Recommended if patient develop any significant chest pain in that case will perform the LAD.  Patient has history of intolerance to statin.  Currently on Evolosumab 140 mg every 2 weeks. -Continue aspirin 81 mg daily   Hyperlipidemia -Continue outpatient  Evolosumab 140 mg every 2 weeks.  BPH -Continue finasteride 5 mg daily   DVT prophylaxis:  Lovenox Code Status:  Full Code Diet: Heart healthy diet Family Communication:  none Disposition  Plan: Pending  blood culture and clinical improvement.  Plan to discharge to home next 2 to 4 days. Consults: General Surgery Admission status:   Inpatient, Med-Surg  Severity of Illness: The appropriate patient status for this patient is INPATIENT. Inpatient status is judged to be reasonable and necessary in order to provide the required intensity of service to ensure the patient's safety. The patient's presenting symptoms, physical exam findings, and initial radiographic and laboratory data in the context of their chronic comorbidities is felt to place them at high risk for further clinical  deterioration. Furthermore, it is not anticipated that the patient will be medically stable for discharge from the hospital within 2 midnights of admission.   * I certify that at the point of admission it is my clinical judgment that the patient will require inpatient hospital care spanning beyond 2 midnights from the point of admission due to high intensity of service, high risk for further deterioration and high frequency of surveillance required.Marland Kitchen    Tereasa Coop, MD [facility]  How to contact the [facility] Attending or Consulting provider 7A - 7P or covering provider during after hours 7P -7A, for this patient.  Check the care team in [facility] and look for a) attending/consulting [facility] provider listed and b) the [facility] team listed Log into [email or web address] and use [place]'s universal password to access. If you do not have the password, please contact the hospital operator. Locate the [facility] provider you are looking for under [facility] and page to a number that you can be directly reached. If you still have difficulty reaching the provider, please page the [facility] (Director on Call) for the Hospitalists listed on amion for assistance.  05/30/2023, 4:22 AM

## 2023-05-30 NOTE — H&P (View-Only) (Signed)
Referring Provider: Dr. Rito Ehrlich Primary Care Physician:  Lupita Raider, MD Primary Gastroenterologist:  Dr. Levora Angel  Reason for Consultation:  Epigastric pain; Abnormal CT scan  HPI: Ruben Rodriguez is a 77 y.o. male with onset of epigastric and RUQ pain for the past 3 weeks that has progressed to severe constant epigastric pain along with severe nausea and vomiting yesterday. Has intermittent heartburn that increased yesterday. Vomiting was bilious. Denies black or red vomitus. Denies black or red stools. Denies weight loss. CT showed wall thickening and inflammation of the proximal duodenum. For the past 2-3 weeks he has been taking one dose of Diclofenac per day and took Advil instead recently while on a 12 day road trip. Prior to 3 weeks ago would take Diclofenac prn for back pain. Denies other NSAIDs. No known history of peptic ulcer disease. Reports an EGD 50 years ago by [employer] prior to [place] war but results not known. Colonoscopy in 2021 by Dr. Levora Angel showed diverticulosis and internal hemorrhoids. Wife at bedside.  Past Medical History:  Diagnosis Date   Arthritis    BPH (benign prostatic hyperplasia)    Chronic back pain    Family history of adverse reaction to anesthesia    father died during carotid artery surgery   GERD (gastroesophageal reflux disease)    Hyperlipidemia    Hypertension    Pneumonia    Spinal stenosis     Past Surgical History:  Procedure Laterality Date   COLONOSCOPY     FINGER SURGERY     to remove a tumor from right little finger   LAMINECTOMY WITH POSTERIOR LATERAL ARTHRODESIS LEVEL 2 N/A 08/01/2018   Procedure: LUMBAR THREE- LUMBAR FOUR, LUMBAR FOUR-LUMBAR FIVE DECOMPRESSION, LUMBAR THREE-LUMBAR FOUR, LUMBAR FOUR-LUMBAR FIVE POSTEROLATERAL ARTHRODESIS;  Surgeon: Shirlean Kelly, MD;  Location: [facility] OR;  Service: Neurosurgery;  Laterality: N/A;   LEFT HEART CATH AND CORONARY ANGIOGRAPHY N/A 08/27/2021   Procedure: LEFT HEART CATH AND CORONARY  ANGIOGRAPHY;  Surgeon: Tonny Bollman, MD;  Location: [facility] INVASIVE CV LAB;  Service: Cardiovascular;  Laterality: N/A;    Prior to Admission medications   Medication Sig Start Date End Date Taking? Authorizing Provider  acetaminophen (TYLENOL) 650 MG CR tablet Take 1,300 mg by mouth every 8 (eight) hours as needed for pain.    [provider]  amLODipine (NORVASC) 10 MG tablet Take 10 mg by mouth daily.    [provider]  aspirin EC 81 MG tablet Take 81 mg by mouth at bedtime. Swallow whole.    [provider]  bisoprolol-hydrochlorothiazide (ZIAC) 10-6.25 MG per tablet Take 1 tablet by mouth daily.    [provider]  diclofenac (VOLTAREN) 75 MG EC tablet Take 75 mg by mouth 2 (two) times daily as needed.    [provider]  Evolocumab (REPATHA SURECLICK) 140 MG/ML SOAJ Inject 140 mg into the skin every 14 (fourteen) days. 09/29/22   Lewayne Bunting, MD  finasteride (PROSCAR) 5 MG tablet Take 5 mg by mouth at bedtime.    [provider]  fluocinonide cream (LIDEX) 0.05 % Apply 1 application. topically daily as needed (itchy ears).    [provider]  ibuprofen (ADVIL) 200 MG tablet Take 400 mg by mouth 2 (two) times daily as needed (pain.).    [provider]  Polyethyl Glycol-Propyl Glycol (LUBRICANT EYE DROPS) 0.4-0.3 % SOLN Place 1-2 drops into both eyes 3 (three) times daily as needed (dry/irritated eyes.). Patient not taking: Reported on 08/17/2022  [provider]    Scheduled Meds:  amLODipine  10 mg Oral Daily   aspirin EC  81 mg Oral QHS   bisoprolol  10 mg Oral Daily   enoxaparin (LOVENOX) injection  40 mg Subcutaneous Q24H   finasteride  5 mg Oral QHS   pantoprazole (PROTONIX) IV  40 mg Intravenous Q12H   sodium chloride flush  3 mL Intravenous Q12H   Continuous Infusions:  sodium chloride Stopped (05/30/23 1121)   sodium chloride 250 mL (05/30/23 0930)   ciprofloxacin 400 mg (05/30/23 1300)    metronidazole 500 mg (05/30/23 0930)   PRN Meds:.sodium chloride, acetaminophen **OR** acetaminophen, alum & mag hydroxide-simeth, hydrALAZINE, ondansetron **OR** ondansetron (ZOFRAN) IV, senna-docusate, sodium chloride flush  Allergies as of 05/29/2023 - Review Complete 05/29/2023  Allergen Reaction Noted   Atorvastatin Other (See Comments) 04/14/2014   Ezetimibe Other (See Comments) 04/14/2014   Zocor [simvastatin] Other (See Comments) 04/14/2014   Amlodipine  11/25/2021   Crestor [rosuvastatin calcium]  01/04/2022   Other Rash 04/14/2014   Penicillins Rash 04/14/2014    Family History  Problem Relation Age of Onset   Hypertension Mother     Social History   Socioeconomic History   Marital status: Married    Spouse name: Not on file   Number of children: 2   Years of education: Not on file   Highest education level: Not on file  Occupational History   Not on file  Tobacco Use   Smoking status: Former   Smokeless tobacco: Never  Vaping Use   Vaping status: Never Used  Substance and Sexual Activity   Alcohol use: No    Comment: Occasional   Drug use: No   Sexual activity: Not on file  Other Topics Concern   Not on file  Social History Narrative   Not on file   Social Determinants of Health   Financial Resource Strain: Not on file  Food Insecurity: Not on file  Transportation Needs: Not on file  Physical Activity: Not on file  Stress: Not on file  Social Connections: Not on file  Intimate Partner Violence: Not on file    Review of Systems: All negative except as stated above in HPI.  Physical Exam: Vital signs: Vitals:   05/30/23 1223 05/30/23 1500  BP: 117/77 128/81  Pulse:  (!) 55  Resp: 18 18  Temp: 98.2 F (36.8 C) 97.8 F (36.6 C)  SpO2: 97% 96%   Last BM Date : 05/28/23 General:  Lethargic, elderly, Well-developed, well-nourished, pleasant and cooperative in NAD Head: normocephalic, atraumatic Eyes: anicteric sclera ENT: oropharynx  clear Neck: supple, nontender Lungs:  Clear throughout to auscultation.   No wheezes, crackles, or rhonchi. No acute distress. Heart:  Regular rate and rhythm; no murmurs, clicks, rubs,  or gallops. Abdomen: epigastric tenderness with guarding, otherwise nontender, soft, nondistended, +BS  Rectal:  Deferred Ext: no edema  GI:  Lab Results: Recent Labs    05/29/23 2009  WBC 11.4*  HGB 14.7  HCT 44.6  PLT 234   BMET Recent Labs    05/29/23 2009  NA 141  K 3.6  CL 98  CO2 32  GLUCOSE 148*  BUN 32*  CREATININE 1.18  CALCIUM 9.6   LFT Recent Labs    05/29/23 2009  PROT 7.0  ALBUMIN 4.2  AST 13*  ALT 16  ALKPHOS 35*  BILITOT 0.5   PT/INR No results for input(s): "LABPROT", "INR" in the last 72 hours.   Studies/Results:  CT ABDOMEN PELVIS W CONTRAST  Result Date: 05/30/2023 CLINICAL DATA:  Abdominal pain EXAM: CT ABDOMEN AND PELVIS WITH CONTRAST TECHNIQUE: Multidetector CT imaging of the abdomen and pelvis was performed using the standard protocol following bolus administration of intravenous contrast. RADIATION DOSE REDUCTION: This exam was performed according to the departmental dose-optimization program which includes automated exposure control, adjustment of the mA and/or kV according to patient size and/or use of iterative reconstruction technique. CONTRAST:  90mL OMNIPAQUE IOHEXOL 350 MG/ML SOLN COMPARISON:  None Available. FINDINGS: Lower chest: No acute abnormality Hepatobiliary: No suspicious focal hepatic abnormality. Gallbladder unremarkable. No biliary ductal dilatation. Pancreas: No focal abnormality or ductal dilatation. Spleen: No focal abnormality.  Normal size. Adrenals/Urinary Tract: 3.7 cm simple appearing cyst in the midpole of the right kidney. Smaller cysts bilaterally. No follow-up imaging recommended. No stones or hydronephrosis. Adrenal glands and urinary bladder unremarkable. Stomach/Bowel: Diffuse colonic diverticulosis. No active diverticulitis.  Normal appendix. There is mild wall thickening and surrounding inflammation involving the proximal duodenum concerning for duodenitis. There appears to be a small fluid collection with locule of gas within the wall which could reflect a small intramural abscess or ulcer. Stomach is moderately distended and fluid-filled. Vascular/Lymphatic: Aortic atherosclerosis. No evidence of aneurysm or adenopathy. Reproductive: No visible focal abnormality. Other: No free fluid or free air. Musculoskeletal: No acute bony abnormality. IMPRESSION: Wall thickening and surrounding inflammatory change involving the proximal duodenum compatible with duodenitis. Possible ulcer versus intramural abscess seen within the first or second portion of the duodenum. Mild distention of the stomach with fluid. Colonic diverticulosis.  No active diverticulitis. Aortic atherosclerosis. Electronically Signed   By: Charlett Nose M.D.   On: 05/30/2023 00:14    Impression/Plan: Epigastric pain with N/V and recent NSAID use concerning for duodenal ulcer with inflammation and wall thickening of proximal duodenum. EGD tomorrow morning to further evaluate proximal duodenitis. Continue IV Protonix. Clear liquid diet. NPO p MN. Supportive care.    LOS: 0 days   Shirley Friar  05/30/2023, 3:42 PM  Questions please call [phone]

## 2023-05-31 ENCOUNTER — Inpatient Hospital Stay (HOSPITAL_COMMUNITY): Payer: Medicare HMO | Admitting: Certified Registered"

## 2023-05-31 ENCOUNTER — Encounter (HOSPITAL_COMMUNITY): Payer: Self-pay | Admitting: Internal Medicine

## 2023-05-31 ENCOUNTER — Encounter (HOSPITAL_COMMUNITY)
Admission: EM | Disposition: A | Discharge status: Home / Self Care | Payer: Self-pay | Source: Home / Self Care | Attending: Internal Medicine

## 2023-05-31 DIAGNOSIS — K630 Abscess of intestine: Secondary | ICD-10-CM | POA: Diagnosis not present

## 2023-05-31 DIAGNOSIS — K2970 Gastritis, unspecified, without bleeding: Secondary | ICD-10-CM

## 2023-05-31 DIAGNOSIS — K449 Diaphragmatic hernia without obstruction or gangrene: Secondary | ICD-10-CM

## 2023-05-31 DIAGNOSIS — K264 Chronic or unspecified duodenal ulcer with hemorrhage: Secondary | ICD-10-CM

## 2023-05-31 HISTORY — PX: ESOPHAGOGASTRODUODENOSCOPY (EGD) WITH PROPOFOL: SHX5813

## 2023-05-31 HISTORY — PX: BIOPSY: SHX5522

## 2023-05-31 LAB — COMPREHENSIVE METABOLIC PANEL
ALT: 16 U/L (ref 0–44)
AST: 17 U/L (ref 15–41)
Albumin: 3.4 g/dL — ABNORMAL LOW (ref 3.5–5.0)
Alkaline Phosphatase: 38 U/L (ref 38–126)
Anion gap: 13 (ref 5–15)
BUN: 18 mg/dL (ref 8–23)
CO2: 24 mmol/L (ref 22–32)
Calcium: 8.8 mg/dL — ABNORMAL LOW (ref 8.9–10.3)
Chloride: 103 mmol/L (ref 98–111)
Creatinine, Ser: 1.12 mg/dL (ref 0.61–1.24)
GFR, Estimated: >60 mL/min (ref >60)
Glucose, Bld: 104 mg/dL — ABNORMAL HIGH (ref 70–99)
Potassium: 3.7 mmol/L (ref 3.5–5.1)
Sodium: 140 mmol/L (ref 135–145)
Total Bilirubin: 0.8 mg/dL (ref 0.3–1.2)
Total Protein: 6.4 g/dL — ABNORMAL LOW (ref 6.5–8.1)

## 2023-05-31 LAB — CBC
HCT: 44.1 % (ref 39.0–52.0)
Hemoglobin: 14.1 g/dL (ref 13.0–17.0)
MCH: 26.9 pg (ref 26.0–34.0)
MCHC: 32 g/dL (ref 30.0–36.0)
MCV: 84 fL (ref 80.0–100.0)
Platelets: 195 10*3/uL (ref 150–400)
RBC: 5.25 MIL/uL (ref 4.22–5.81)
RDW: 14 % (ref 11.5–15.5)
WBC: 7.7 10*3/uL (ref 4.0–10.5)
nRBC: 0 % (ref 0.0–0.2)

## 2023-05-31 SURGERY — ESOPHAGOGASTRODUODENOSCOPY (EGD) WITH PROPOFOL
Anesthesia: Monitor Anesthesia Care

## 2023-05-31 MED ORDER — PROPOFOL 500 MG/50ML IV EMUL
INTRAVENOUS | Status: DC | PRN
  Administered 2023-05-31: 125 ug/kg/min via INTRAVENOUS
  Administered 2023-05-31: 50 mg via INTRAVENOUS

## 2023-05-31 MED ORDER — SODIUM CHLORIDE 0.45 % IV SOLN: INTRAVENOUS | Status: DC

## 2023-05-31 MED ORDER — SODIUM CHLORIDE 0.9 % IV SOLN: 250.0000 mL | INTRAVENOUS | Status: DC | PRN

## 2023-05-31 MED ORDER — PANTOPRAZOLE INFUSION (NEW) - SIMPLE MED
8.0000 mg/h | INTRAVENOUS | Status: DC
  Administered 2023-05-31 – 2023-06-02 (×5): 8 mg/h via INTRAVENOUS
  Filled 2023-05-31 (×7): qty 100

## 2023-05-31 MED ORDER — PANTOPRAZOLE SODIUM 40 MG IV SOLR
40.0000 mg | Freq: Two times a day (BID) | INTRAVENOUS | Status: DC
  Filled 2023-05-31: qty 10

## 2023-05-31 MED ORDER — DIPHENHYDRAMINE HCL 50 MG/ML IJ SOLN: 12.5000 mg | Freq: Four times a day (QID) | INTRAMUSCULAR | Status: DC | PRN

## 2023-05-31 MED ORDER — PANTOPRAZOLE SODIUM 40 MG IV SOLR
40.0000 mg | Freq: Once | INTRAVENOUS | Status: AC
  Administered 2023-05-31: 40 mg via INTRAVENOUS
  Filled 2023-05-31: qty 10

## 2023-05-31 MED ORDER — LACTATED RINGERS IV SOLN: INTRAVENOUS | Status: DC

## 2023-05-31 SURGICAL SUPPLY — 15 items

## 2023-05-31 NOTE — Progress Notes (Signed)
PROGRESS NOTE    Aristede Buchmann [facility]  UVO:[id number] DOB: 05/28/1946 DOA: 05/29/2023 PCP: Lupita Raider, MD    Brief Narrative:  Ruben Rodriguez is a 77 y.o. male with medical history significant of hyperlipidemia, CAD on medical management with aspirin and statin, essential hypertension and BPH present to emergency department complaining of abdominal pain.  Patient reported he has 2 to 3 weeks of epigastric and right lower quadrant abdominal pain.  Reported earlier in the afternoon did after his lunch he has few episodes of vomiting where he felt like that a lot of gastric acid and he was experiencing a lot of heartburn.   EGD done 9/4 with ulcers found.   Assessment and Plan: Duodenal ulcer -protonix gtt, clear liquid diet for several days -GI consult appreciated -s/p EGD: multiple ulcers  History of essential hypertension -Resume home blood pressure regimen amlodipine 10 mg daily and bisoprolol-hydrochlorothiazide combination 10-6.25 mg daily. -Continue hydralazine as needed.   History of CAD on medical management -No chest pain. Patient has history of CAD on medical management with aspirin.  Last seen cardiology on 07/2022.  Recommended if patient develop any significant chest pain in that case will perform the LAD.  Patient has history of intolerance to statin.  Currently on Evolosumab 140 mg every 2 weeks.     Hyperlipidemia -Continue outpatient  Evolosumab 140 mg every 2 weeks.   BPH -Continue finasteride 5 mg daily     DVT prophylaxis: enoxaparin (LOVENOX) injection 40 mg Start: 05/30/23 1400 SCDs Start: 05/30/23 0332    Code Status: Full Code Family Communication:   Disposition Plan:  Level of care: Med-Surg Status is: Inpatient Remains inpatient appropriate     Consultants:  GI  Subjective: No current complaints  Objective: Vitals:   05/31/23 1105 05/31/23 1110 05/31/23 1115 05/31/23 1241  BP: 105/76 132/83 130/84 (!) 146/75  Pulse: (!) 55 (!) 55 (!) 54 (!)  57  Resp: 17 17 12 16   Temp:    97.9 F (36.6 C)  TempSrc:    Oral  SpO2: 94% 96% 97% 100%  Weight:      Height:        Intake/Output Summary (Last 24 hours) at 05/31/2023 1308 Last data filed at 05/31/2023 1053 Gross per 24 hour  Intake 728.22 ml  Output --  Net 728.22 ml   Filed Weights   05/29/23 2004 05/31/23 0912  Weight: 95.3 kg 95.3 kg    Examination:   General: Appearance:    Obese male in no acute distress     Lungs:      respirations unlabored  Heart:    Bradycardic.    MS:   All extremities are intact.    Neurologic:   Awake, alert       Data Reviewed: I have personally reviewed following labs and imaging studies  CBC: Recent Labs  Lab 05/29/23 2009 05/31/23 0736  WBC 11.4* 7.7  HGB 14.7 14.1  HCT 44.6 44.1  MCV 81.5 84.0  PLT 234 195   Basic Metabolic Panel: Recent Labs  Lab 05/29/23 2009 05/31/23 0736  NA 141 140  K 3.6 3.7  CL 98 103  CO2 32 24  GLUCOSE 148* 104*  BUN 32* 18  CREATININE 1.18 1.12  CALCIUM 9.6 8.8*   GFR: Estimated Creatinine Clearance: 63.9 mL/min (by C-G formula based on SCr of 1.12 mg/dL). Liver Function Tests: Recent Labs  Lab 05/29/23 2009 05/31/23 0736  AST 13* 17  ALT 16 16  ALKPHOS  35* 38  BILITOT 0.5 0.8  PROT 7.0 6.4*  ALBUMIN 4.2 3.4*   Recent Labs  Lab 05/29/23 2009  LIPASE 28   No results for input(s): "AMMONIA" in the last 168 hours. Coagulation Profile: No results for input(s): "INR", "PROTIME" in the last 168 hours. Cardiac Enzymes: No results for input(s): "CKTOTAL", "CKMB", "CKMBINDEX", "TROPONINI" in the last 168 hours. BNP (last 3 results) No results for input(s): "PROBNP" in the last 8760 hours. HbA1C: No results for input(s): "HGBA1C" in the last 72 hours. CBG: No results for input(s): "GLUCAP" in the last 168 hours. Lipid Profile: No results for input(s): "CHOL", "HDL", "LDLCALC", "TRIG", "CHOLHDL", "LDLDIRECT" in the last 72 hours. Thyroid Function Tests: No results for  input(s): "TSH", "T4TOTAL", "FREET4", "T3FREE", "THYROIDAB" in the last 72 hours. Anemia Panel: No results for input(s): "VITAMINB12", "FOLATE", "FERRITIN", "TIBC", "IRON", "RETICCTPCT" in the last 72 hours. Sepsis Labs: No results for input(s): "PROCALCITON", "LATICACIDVEN" in the last 168 hours.  Recent Results (from the past 240 hour(s))  Culture, blood (Routine X 2) w Reflex to ID Panel     Status: None (Preliminary result)   Collection Time: 05/30/23  5:14 AM   Specimen: BLOOD  Result Value Ref Range Status   Specimen Description BLOOD BLOOD LEFT ARM  Final   Special Requests   Final    BOTTLES DRAWN AEROBIC AND ANAEROBIC Blood Culture adequate volume   Culture   Final    NO GROWTH < 24 HOURS Performed at [facility], [address] [place], [place] [address]    Report Status PENDING  Incomplete  Culture, blood (Routine X 2) w Reflex to ID Panel     Status: None (Preliminary result)   Collection Time: 05/30/23  5:14 AM   Specimen: BLOOD  Result Value Ref Range Status   Specimen Description BLOOD BLOOD LEFT HAND  Final   Special Requests   Final    BOTTLES DRAWN AEROBIC AND ANAEROBIC Blood Culture adequate volume   Culture   Final    NO GROWTH < 24 HOURS Performed at [facility], [address], [place], [place] [address]    Report Status PENDING  Incomplete         Radiology Studies: CT ABDOMEN PELVIS W CONTRAST  Result Date: 05/30/2023 CLINICAL DATA:  Abdominal pain EXAM: CT ABDOMEN AND PELVIS WITH CONTRAST TECHNIQUE: Multidetector CT imaging of the abdomen and pelvis was performed using the standard protocol following bolus administration of intravenous contrast. RADIATION DOSE REDUCTION: This exam was performed according to the departmental dose-optimization program which includes automated exposure control, adjustment of the mA and/or kV according to patient size and/or use of iterative reconstruction technique. CONTRAST:  90mL OMNIPAQUE IOHEXOL 350  MG/ML SOLN COMPARISON:  None Available. FINDINGS: Lower chest: No acute abnormality Hepatobiliary: No suspicious focal hepatic abnormality. Gallbladder unremarkable. No biliary ductal dilatation. Pancreas: No focal abnormality or ductal dilatation. Spleen: No focal abnormality.  Normal size. Adrenals/Urinary Tract: 3.7 cm simple appearing cyst in the midpole of the right kidney. Smaller cysts bilaterally. No follow-up imaging recommended. No stones or hydronephrosis. Adrenal glands and urinary bladder unremarkable. Stomach/Bowel: Diffuse colonic diverticulosis. No active diverticulitis. Normal appendix. There is mild wall thickening and surrounding inflammation involving the proximal duodenum concerning for duodenitis. There appears to be a small fluid collection with locule of gas within the wall which could reflect a small intramural abscess or ulcer. Stomach is moderately distended and fluid-filled. Vascular/Lymphatic: Aortic atherosclerosis. No evidence of aneurysm or  adenopathy. Reproductive: No visible focal abnormality. Other: No free fluid or free air. Musculoskeletal: No acute bony abnormality. IMPRESSION: Wall thickening and surrounding inflammatory change involving the proximal duodenum compatible with duodenitis. Possible ulcer versus intramural abscess seen within the first or second portion of the duodenum. Mild distention of the stomach with fluid. Colonic diverticulosis.  No active diverticulitis. Aortic atherosclerosis. Electronically Signed   By: Charlett Nose M.D.   On: 05/30/2023 00:14        Scheduled Meds:  amLODipine  10 mg Oral Daily   aspirin EC  81 mg Oral QHS   bisoprolol  10 mg Oral Daily   enoxaparin (LOVENOX) injection  40 mg Subcutaneous Q24H   finasteride  5 mg Oral QHS   [START ON 06/03/2023] pantoprazole  40 mg Intravenous Q12H   sodium chloride flush  3 mL Intravenous Q12H   Continuous Infusions:  sodium chloride     ciprofloxacin 400 mg (05/31/23 1149)    metronidazole 500 mg (05/31/23 0750)   pantoprazole 8 mg/hr (05/31/23 1216)     LOS: 1 day    Time spent: 45 minutes spent on chart review, discussion with nursing staff, consultants, updating family and interview/physical exam; more than 50% of that time was spent in counseling and/or coordination of care.    Joseph Art, DO [facility] Available via Epic secure chat 7am-7pm After these hours, please refer to coverage provider listed on [email or web address] 05/31/2023, 1:08 PM

## 2023-05-31 NOTE — Plan of Care (Signed)
°  Problem: Clinical Measurements: Goal: Will remain free from infection Outcome: Progressing   Problem: Activity: Goal: Risk for activity intolerance will decrease Outcome: Progressing   Problem: Nutrition: Goal: Adequate nutrition will be maintained Outcome: Progressing

## 2023-05-31 NOTE — Interval H&P Note (Signed)
History and Physical Interval Note:  05/31/2023 10:27 AM  Ruben Rodriguez  has presented today for surgery, with the diagnosis of abd pain.  The various methods of treatment have been discussed with the patient and family. After consideration of risks, benefits and other options for treatment, the patient has consented to  Procedure(s): ESOPHAGOGASTRODUODENOSCOPY (EGD) WITH PROPOFOL (N/A) as a surgical intervention.  The patient's history has been reviewed, patient examined, no change in status, stable for surgery.  I have reviewed the patient's chart and labs.  Questions were answered to the patient's satisfaction.     Shirley Friar

## 2023-05-31 NOTE — Transfer of Care (Signed)
Immediate Anesthesia Transfer of Care Note  Patient: Ruben Rodriguez [facility]  Procedure(s) Performed: ESOPHAGOGASTRODUODENOSCOPY (EGD) WITH PROPOFOL BIOPSY  Patient Location: Endoscopy Unit  Anesthesia Type:MAC  Level of Consciousness: awake, drowsy, and patient cooperative  Airway & Oxygen Therapy: Patient Spontanous Breathing  Post-op Assessment: Report given to RN and Post -op Vital signs reviewed and stable  Post vital signs: Reviewed and stable  Last Vitals:  Vitals Value Taken Time  BP 125/76 05/31/23 1053  Temp 36.5 C 05/31/23 1053  Pulse 58 05/31/23 1053  Resp 18 05/31/23 1053  SpO2 96 % 05/31/23 1053    Last Pain:  Vitals:   05/31/23 1053  TempSrc: Temporal  PainSc: 0-No pain      Patients Stated Pain Goal: 4 (05/31/23 0912)  Complications: No notable events documented.

## 2023-05-31 NOTE — Anesthesia Preprocedure Evaluation (Signed)
Anesthesia Evaluation  Patient identified by MRN, date of birth, ID band Patient awake    Reviewed: Allergy & Precautions, NPO status , Patient's Chart, lab work & pertinent test results  History of Anesthesia Complications Negative for: history of anesthetic complications  Airway Mallampati: III  TM Distance: >3 FB Neck ROM: Full    Dental  (+) Teeth Intact, Dental Advisory Given   Pulmonary neg shortness of breath, neg sleep apnea, neg COPD, neg recent URI, former smoker   breath sounds clear to auscultation       Cardiovascular hypertension, Pt. on medications and Pt. on home beta blockers + CAD and + Peripheral Vascular Disease   Rhythm:Regular    1st RPL lesion is 90% stenosed.   Mid LAD-1 lesion is 50% stenosed.   Mid LAD-2 lesion is 80% stenosed.   Dist LAD lesion is 60% stenosed.   1.  Patent left main 2.  Severe mid LAD stenosis segmentally through the mid and distal vessel, but widely patent proximal LAD and large first diagonal branches noted. 3.  Patent left circumflex with mild luminal irregularity 4.  Patent RCA with mild diffuse plaquing and severe stenosis of the first posterolateral branch 5.  Low LVEDP   Favor medical therapy: Patient would require long segment stenting of multiple lesions in the mid and distal LAD in order to treat that segment of disease that is hemodynamically significant by CT-FFR.  The distal vessel is small in caliber.  The patient is having no angina.  I think it is most prudent to treat him medically.  If he has any high risk features over time on surveillance treadmill testing or if he develops angina, PCI is technically feasible.    Neuro/Psych negative neurological ROS  negative psych ROS   GI/Hepatic Neg liver ROS,,,  Endo/Other  negative endocrine ROS    Renal/GU Renal diseaseLab Results      Component                Value               Date                      NA                        140                 05/31/2023                K                        3.7                 05/31/2023                CO2                      24                  05/31/2023                GLUCOSE                  104 (H)             05/31/2023                BUN  18                  05/31/2023                CREATININE               1.12                05/31/2023                CALCIUM                  8.8 (L)             05/31/2023                EGFR                     71                  08/13/2021                GFRNONAA                 >60                 05/31/2023                Musculoskeletal  (+) Arthritis ,    Abdominal   Peds  Hematology negative hematology ROS (+) Lab Results      Component                Value               Date                      WBC                      7.7                 05/31/2023                HGB                      14.1                05/31/2023                HCT                      44.1                05/31/2023                MCV                      84.0                05/31/2023                PLT                      195                 05/31/2023              Anesthesia Other Findings   Reproductive/Obstetrics  Anesthesia Physical Anesthesia Plan  ASA: 2  Anesthesia Plan: MAC   Post-op Pain Management: Minimal or no pain anticipated   Induction:   PONV Risk Score and Plan: 1 and Propofol infusion and Treatment may vary due to age or medical condition  Airway Management Planned: Nasal Cannula, Natural Airway and Simple Face Mask  Additional Equipment: None  Intra-op Plan:   Post-operative Plan:   Informed Consent: I have reviewed the patients History and Physical, chart, labs and discussed the procedure including the risks, benefits and alternatives for the proposed anesthesia with the patient or authorized representative who  has indicated his/her understanding and acceptance.     Dental advisory given  Plan Discussed with: CRNA  Anesthesia Plan Comments:          Anesthesia Quick Evaluation

## 2023-05-31 NOTE — Progress Notes (Signed)
Day of Surgery  Subjective: Feeling well today.  Still with some bloating.  Mild discomfort.  ROS: See above, otherwise other systems negative  Objective: Vital signs in last 24 hours: Temp:  [97.1 F (36.2 C)-98.2 F (36.8 C)] 97.1 F (36.2 C) (09/04 0912) Pulse Rate:  [50-68] 51 (09/04 0912) Resp:  [16-20] 20 (09/04 0912) BP: (117-157)/(75-85) 149/84 (09/04 0912) SpO2:  [93 %-98 %] 98 % (09/04 0912) Weight:  [95.3 kg] 95.3 kg (09/04 0912) Last BM Date : 05/29/23  Intake/Output from previous day: 09/03 0701 - 09/04 0700 In: 431.2 [I.V.:141.5; IV Piggyback:289.8] Out: -  Intake/Output this shift: No intake/output data recorded.  PE: Gen: sitting in his chair in NAD Abd: soft, but protuberant, minimal central/epigastric tenderness  Lab Results:  Recent Labs    05/29/23 2009 05/31/23 0736  WBC 11.4* 7.7  HGB 14.7 14.1  HCT 44.6 44.1  PLT 234 195   BMET Recent Labs    05/29/23 2009  NA 141  K 3.6  CL 98  CO2 32  GLUCOSE 148*  BUN 32*  CREATININE 1.18  CALCIUM 9.6   PT/INR No results for input(s): "LABPROT", "INR" in the last 72 hours. CMP     Component Value Date/Time   NA 141 05/29/2023 2009   NA 142 08/13/2021 0817   K 3.6 05/29/2023 2009   CL 98 05/29/2023 2009   CO2 32 05/29/2023 2009   GLUCOSE 148 (H) 05/29/2023 2009   BUN 32 (H) 05/29/2023 2009   BUN 19 08/13/2021 0817   CREATININE 1.18 05/29/2023 2009   CALCIUM 9.6 05/29/2023 2009   PROT 7.0 05/29/2023 2009   PROT 6.4 10/04/2021 0942   ALBUMIN 4.2 05/29/2023 2009   ALBUMIN 4.2 10/04/2021 0942   AST 13 (L) 05/29/2023 2009   ALT 16 05/29/2023 2009   ALKPHOS 35 (L) 05/29/2023 2009   BILITOT 0.5 05/29/2023 2009   BILITOT 0.6 10/04/2021 0942   GFRNONAA >60 05/29/2023 2009   GFRAA >60 07/20/2018 1112   Lipase     Component Value Date/Time   LIPASE 28 05/29/2023 2009       Studies/Results: CT ABDOMEN PELVIS W CONTRAST  Result Date: 05/30/2023 CLINICAL DATA:  Abdominal pain  EXAM: CT ABDOMEN AND PELVIS WITH CONTRAST TECHNIQUE: Multidetector CT imaging of the abdomen and pelvis was performed using the standard protocol following bolus administration of intravenous contrast. RADIATION DOSE REDUCTION: This exam was performed according to the departmental dose-optimization program which includes automated exposure control, adjustment of the mA and/or kV according to patient size and/or use of iterative reconstruction technique. CONTRAST:  90mL OMNIPAQUE IOHEXOL 350 MG/ML SOLN COMPARISON:  None Available. FINDINGS: Lower chest: No acute abnormality Hepatobiliary: No suspicious focal hepatic abnormality. Gallbladder unremarkable. No biliary ductal dilatation. Pancreas: No focal abnormality or ductal dilatation. Spleen: No focal abnormality.  Normal size. Adrenals/Urinary Tract: 3.7 cm simple appearing cyst in the midpole of the right kidney. Smaller cysts bilaterally. No follow-up imaging recommended. No stones or hydronephrosis. Adrenal glands and urinary bladder unremarkable. Stomach/Bowel: Diffuse colonic diverticulosis. No active diverticulitis. Normal appendix. There is mild wall thickening and surrounding inflammation involving the proximal duodenum concerning for duodenitis. There appears to be a small fluid collection with locule of gas within the wall which could reflect a small intramural abscess or ulcer. Stomach is moderately distended and fluid-filled. Vascular/Lymphatic: Aortic atherosclerosis. No evidence of aneurysm or adenopathy. Reproductive: No visible focal abnormality. Other: No free fluid or free air. Musculoskeletal: No acute bony abnormality.  IMPRESSION: Wall thickening and surrounding inflammatory change involving the proximal duodenum compatible with duodenitis. Possible ulcer versus intramural abscess seen within the first or second portion of the duodenum. Mild distention of the stomach with fluid. Colonic diverticulosis.  No active diverticulitis. Aortic  atherosclerosis. Electronically Signed   By: Charlett Nose M.D.   On: 05/30/2023 00:14    Anti-infectives: Anti-infectives (From admission, onward)    Start     Dose/Rate Route Frequency Ordered Stop   05/30/23 1200  [MAR Hold]  ciprofloxacin (CIPRO) IVPB 400 mg        (MAR Hold since Wed 05/31/2023 at 0906.Hold Reason: Transfer to a Procedural area)   400 mg 200 mL/hr over 60 Minutes Intravenous Every 12 hours 05/30/23 0403 06/06/23 1159   05/30/23 0800  [MAR Hold]  metroNIDAZOLE (FLAGYL) IVPB 500 mg        (MAR Hold since Wed 05/31/2023 at 0906.Hold Reason: Transfer to a Procedural area)   500 mg 100 mL/hr over 60 Minutes Intravenous Every 8 hours 05/30/23 0403 06/06/23 0759   05/30/23 0500  ciprofloxacin (CIPRO) IVPB 400 mg  Status:  Discontinued        400 mg 200 mL/hr over 60 Minutes Intravenous Every 12 hours 05/30/23 0402 05/30/23 0403   05/30/23 0100  ciprofloxacin (CIPRO) IVPB 400 mg        400 mg 200 mL/hr over 60 Minutes Intravenous  Once 05/30/23 0054 05/30/23 0227   05/30/23 0100  metroNIDAZOLE (FLAGYL) IVPB 500 mg        500 mg 100 mL/hr over 60 Minutes Intravenous  Once 05/30/23 0054 05/30/23 0329        Assessment/Plan Duodenitis  -suspect this is secondary likely to ulcer disease.  GI  has seen and he is going down for an EGD today to evaluate this area -cont PPI and abx therapy for now -no immediate indications for surgery -diet per GI following EGD today -d/w primary team  FEN - IVFs/NPO for EGD VTE - Lovenox ID - Cipro/Flagyl  HLD HTN BPH  I reviewed Consultant GI notes, hospitalist notes, last 24 h vitals and pain scores, last 48 h intake and output, last 24 h labs and trends, and last 24 h imaging results.   LOS: 1 day    Letha Cape , [facility] 05/31/2023, 9:18 AM Please see Amion for pager number during day hours 7:00am-4:30pm or 7:00am -11:30am on weekends

## 2023-05-31 NOTE — Op Note (Signed)
[facility] Patient Name: Ruben Rodriguez Procedure Date : 05/31/2023 MRN: [id number] Attending MD: Shirley Friar , MD, [id number] Date of Birth: [birth year] CSN: [id number] Age: 77 Admit Type: Inpatient Procedure:                Upper GI endoscopy Indications:              Epigastric abdominal pain, Abnormal CT of the GI                            tract Providers:                Shirley Friar, MD, Doristine Mango, RN,                            Jacquelyn "Jaci" Clelia Croft, RN, Rozetta Nunnery,                            Technician Referring MD:             hospital team Medicines:                Propofol per Anesthesia, Monitored Anesthesia Care Complications:            No immediate complications. Estimated Blood Loss:     Estimated blood loss was minimal. Procedure:                Pre-Anesthesia Assessment:                           - Prior to the procedure, a History and Physical                            was performed, and patient medications and                            allergies were reviewed. The patient's tolerance of                            previous anesthesia was also reviewed. The risks                            and benefits of the procedure and the sedation                            options and risks were discussed with the patient.                            All questions were answered, and informed consent                            was obtained. Prior Anticoagulants: The patient has                            taken no anticoagulant or antiplatelet agents. ASA  Grade Assessment: III - A patient with severe                            systemic disease. After reviewing the risks and                            benefits, the patient was deemed in satisfactory                            condition to undergo the procedure.                           After obtaining informed consent, the endoscope was                             passed under direct vision. Throughout the                            procedure, the patient's blood pressure, pulse, and                            oxygen saturations were monitored continuously. The                            [id number]-H190 ([id number]) Olympus endoscope was introduced                            through the mouth, and advanced to the second part                            of duodenum. The upper GI endoscopy was somewhat                            difficult due to large duodenal ulcer. The patient                            tolerated the procedure well. Scope In: Scope Out: Findings:      LA Grade C (one or more mucosal breaks continuous between tops of 2 or       more mucosal folds, less than 75% circumference) esophagitis with no       bleeding was found at the gastroesophageal junction.      The Z-line was found 40 cm from the incisors.      Segmental mild inflammation characterized by congestion (edema) and       erythema was found in the gastric antrum. Biopsies were taken with a       cold forceps for histology. Estimated blood loss was minimal.      A medium-sized hiatal hernia was present.      One partially obstructing non-bleeding cratered duodenal ulcer with       pigmented material was found in the second portion of the duodenum. The       lesion was 30 mm in largest dimension. Perforation of the bowel wall       cannot be ruled out.  Few oozing cratered duodenal ulcers with pigmented material were found       in the duodenal bulb. The largest lesion was 10 mm in largest dimension.       Biopsies were taken with a cold forceps for histology.      2nd portion of duodenum not completely visualized due to large ulcer at       junction of 1st and 2nd portions of duodenum. Impression:               - LA Grade C reflux esophagitis with no bleeding.                           - Z-line, 40 cm from the incisors.                           - Gastritis. Biopsied.                            - Medium-sized hiatal hernia.                           - Partially obstructing non-bleeding duodenal ulcer                            with pigmented material. Perforation of the bowel                            wall cannot be ruled out.                           - Oozing duodenal ulcers with pigmented material.                            Biopsied. Recommendation:           - Clear liquid diet.                           - Observe patient's clinical course.                           - Continue Protonix drip.                           - Await pathology results. Procedure Code(s):        --- Professional ---                           [phone], Esophagogastroduodenoscopy, flexible,                            transoral; with biopsy, single or multiple Diagnosis Code(s):        --- Professional ---                           K26.9, Duodenal ulcer, unspecified as acute or  chronic, without hemorrhage or perforation                           K26.4, Chronic or unspecified duodenal ulcer with                            hemorrhage                           K29.70, Gastritis, unspecified, without bleeding                           K21.00, Gastro-esophageal reflux disease with                            esophagitis, without bleeding                           R10.13, Epigastric pain                           K44.9, Diaphragmatic hernia without obstruction or                            gangrene                           R93.3, Abnormal findings on diagnostic imaging of                            other parts of digestive tract CPT copyright 2022 [employer]. All rights reserved. The codes documented in this report are preliminary and upon coder review may  be revised to meet current compliance requirements. Shirley Friar, MD 05/31/2023 11:01:52 AM This report has been signed electronically. Number of Addenda: 0

## 2023-06-01 DIAGNOSIS — K630 Abscess of intestine: Secondary | ICD-10-CM | POA: Diagnosis not present

## 2023-06-01 LAB — CBC
HCT: 39 % (ref 39.0–52.0)
Hemoglobin: 12.6 g/dL — ABNORMAL LOW (ref 13.0–17.0)
MCH: 27 pg (ref 26.0–34.0)
MCHC: 32.3 g/dL (ref 30.0–36.0)
MCV: 83.7 fL (ref 80.0–100.0)
Platelets: 162 10*3/uL (ref 150–400)
RBC: 4.66 MIL/uL (ref 4.22–5.81)
RDW: 14.2 % (ref 11.5–15.5)
WBC: 6 10*3/uL (ref 4.0–10.5)
nRBC: 0 % (ref 0.0–0.2)

## 2023-06-01 LAB — COMPREHENSIVE METABOLIC PANEL
ALT: 15 U/L (ref 0–44)
AST: 13 U/L — ABNORMAL LOW (ref 15–41)
Albumin: 3 g/dL — ABNORMAL LOW (ref 3.5–5.0)
Alkaline Phosphatase: 31 U/L — ABNORMAL LOW (ref 38–126)
Anion gap: 8 (ref 5–15)
BUN: 13 mg/dL (ref 8–23)
CO2: 22 mmol/L (ref 22–32)
Calcium: 8 mg/dL — ABNORMAL LOW (ref 8.9–10.3)
Chloride: 108 mmol/L (ref 98–111)
Creatinine, Ser: 1.06 mg/dL (ref 0.61–1.24)
GFR, Estimated: >60 mL/min (ref >60)
Glucose, Bld: 102 mg/dL — ABNORMAL HIGH (ref 70–99)
Potassium: 3.3 mmol/L — ABNORMAL LOW (ref 3.5–5.1)
Sodium: 138 mmol/L (ref 135–145)
Total Bilirubin: 0.7 mg/dL (ref 0.3–1.2)
Total Protein: 5.6 g/dL — ABNORMAL LOW (ref 6.5–8.1)

## 2023-06-01 LAB — SURGICAL PATHOLOGY

## 2023-06-01 MED ORDER — POTASSIUM CHLORIDE CRYS ER 20 MEQ PO TBCR
40.0000 meq | EXTENDED_RELEASE_TABLET | Freq: Once | ORAL | Status: AC
  Administered 2023-06-01: 40 meq via ORAL
  Filled 2023-06-01: qty 2

## 2023-06-01 MED ORDER — CLOBETASOL PROPIONATE 0.05 % EX CREA
TOPICAL_CREAM | Freq: Two times a day (BID) | CUTANEOUS | Status: DC
  Filled 2023-06-01: qty 15

## 2023-06-01 NOTE — Plan of Care (Signed)
  Problem: Clinical Measurements: Goal: Will remain free from infection Outcome: Progressing Goal: Diagnostic test results will improve Outcome: Progressing   Problem: Activity: Goal: Risk for activity intolerance will decrease Outcome: Progressing   Problem: Nutrition: Goal: Adequate nutrition will be maintained Outcome: Progressing

## 2023-06-01 NOTE — Progress Notes (Signed)
PROGRESS NOTE    [name] [facility]  NWG:[id number] DOB: 01-24-1946 DOA: 05/29/2023 PCP: Lupita Raider, MD    Brief Narrative:  Ruben Rodriguez is a 77 y.o. male with medical history significant of hyperlipidemia, CAD on medical management with aspirin and statin, essential hypertension and BPH present to emergency department complaining of abdominal pain.  Patient reported he has 2 to 3 weeks of epigastric and right lower quadrant abdominal pain.  Reported earlier in the afternoon did after his lunch he has few episodes of vomiting where he felt like that a lot of gastric acid and he was experiencing a lot of heartburn.   EGD done 9/4 with ulcers found.   Assessment and Plan: Duodenal ulcers -protonix gtt, clear liquid diet advanced to full -GI consult appreciated -s/p EGD: multiple ulcers  History of essential hypertension -Resume home blood pressure regimen amlodipine 10 mg daily and bisoprolol-hydrochlorothiazide combination 10-6.25 mg daily. -Continue hydralazine as needed.   History of CAD on medical management -No chest pain. Patient has history of CAD on medical management with aspirin.  Last seen cardiology on 07/2022.  Recommended if patient develop any significant chest pain in that case will perform the LAD.  Patient has history of intolerance to statin.  Currently on Evolosumab 140 mg every 2 weeks.     Hyperlipidemia -Continue outpatient  Evolosumab 140 mg every 2 weeks.  Hypokalemia -repelte  BPH -Continue finasteride 5 mg daily   ? Contact dermatitis -added steroid cream after shower and linen change   DVT prophylaxis: enoxaparin (LOVENOX) injection 40 mg Start: 05/30/23 1400 SCDs Start: 05/30/23 0332    Code Status: Full Code   Disposition Plan:  Level of care: Med-Surg Status is: Inpatient Remains inpatient appropriate     Consultants:  GI  Subjective: Rash on legs is itchy  Objective: Vitals:   05/31/23 1603 05/31/23 2003 06/01/23 0505 06/01/23  0929  BP: 129/71 124/74 129/81 139/72  Pulse: (!) 53 (!) 55 (!) 54 (!) 50  Resp: 16 18 18 16   Temp: 97.6 F (36.4 C) 97.8 F (36.6 C) 97.9 F (36.6 C) 98 F (36.7 C)  TempSrc: Oral Oral Oral Oral  SpO2: 96% 99% 97% 99%  Weight:      Height:        Intake/Output Summary (Last 24 hours) at 06/01/2023 1114 Last data filed at 06/01/2023 0400 Gross per 24 hour  Intake 989.39 ml  Output --  Net 989.39 ml   Filed Weights   05/29/23 2004 05/31/23 0912  Weight: 95.3 kg 95.3 kg    Examination:   General: Appearance:    Obese male in no acute distress     Lungs:      respirations unlabored  Heart:    Bradycardic.    MS:   All extremities are intact.    Neurologic:   Awake, alert       Data Reviewed: I have personally reviewed following labs and imaging studies  CBC: Recent Labs  Lab 05/29/23 2009 05/31/23 0736 06/01/23 0807  WBC 11.4* 7.7 6.0  HGB 14.7 14.1 12.6*  HCT 44.6 44.1 39.0  MCV 81.5 84.0 83.7  PLT 234 195 162   Basic Metabolic Panel: Recent Labs  Lab 05/29/23 2009 05/31/23 0736 06/01/23 0807  NA 141 140 138  K 3.6 3.7 3.3*  CL 98 103 108  CO2 32 24 22  GLUCOSE 148* 104* 102*  BUN 32* 18 13  CREATININE 1.18 1.12 1.06  CALCIUM 9.6 8.8* 8.0*  GFR: Estimated Creatinine Clearance: 67.5 mL/min (by C-G formula based on SCr of 1.06 mg/dL). Liver Function Tests: Recent Labs  Lab 05/29/23 2009 05/31/23 0736 06/01/23 0807  AST 13* 17 13*  ALT 16 16 15   ALKPHOS 35* 38 31*  BILITOT 0.5 0.8 0.7  PROT 7.0 6.4* 5.6*  ALBUMIN 4.2 3.4* 3.0*   Recent Labs  Lab 05/29/23 2009  LIPASE 28   No results for input(s): "AMMONIA" in the last 168 hours. Coagulation Profile: No results for input(s): "INR", "PROTIME" in the last 168 hours. Cardiac Enzymes: No results for input(s): "CKTOTAL", "CKMB", "CKMBINDEX", "TROPONINI" in the last 168 hours. BNP (last 3 results) No results for input(s): "PROBNP" in the last 8760 hours. HbA1C: No results for  input(s): "HGBA1C" in the last 72 hours. CBG: No results for input(s): "GLUCAP" in the last 168 hours. Lipid Profile: No results for input(s): "CHOL", "HDL", "LDLCALC", "TRIG", "CHOLHDL", "LDLDIRECT" in the last 72 hours. Thyroid Function Tests: No results for input(s): "TSH", "T4TOTAL", "FREET4", "T3FREE", "THYROIDAB" in the last 72 hours. Anemia Panel: No results for input(s): "VITAMINB12", "FOLATE", "FERRITIN", "TIBC", "IRON", "RETICCTPCT" in the last 72 hours. Sepsis Labs: No results for input(s): "PROCALCITON", "LATICACIDVEN" in the last 168 hours.  Recent Results (from the past 240 hour(s))  Culture, blood (Routine X 2) w Reflex to ID Panel     Status: None (Preliminary result)   Collection Time: 05/30/23  5:14 AM   Specimen: BLOOD  Result Value Ref Range Status   Specimen Description BLOOD BLOOD LEFT ARM  Final   Special Requests   Final    BOTTLES DRAWN AEROBIC AND ANAEROBIC Blood Culture adequate volume   Culture   Final    NO GROWTH 2 DAYS Performed at [facility], [address], [place], [place] [address]    Report Status PENDING  Incomplete  Culture, blood (Routine X 2) w Reflex to ID Panel     Status: None (Preliminary result)   Collection Time: 05/30/23  5:14 AM   Specimen: BLOOD  Result Value Ref Range Status   Specimen Description BLOOD BLOOD LEFT HAND  Final   Special Requests   Final    BOTTLES DRAWN AEROBIC AND ANAEROBIC Blood Culture adequate volume   Culture   Final    NO GROWTH 2 DAYS Performed at [facility], [address] [place], [place] [address]    Report Status PENDING  Incomplete         Radiology Studies: No results found.      Scheduled Meds:  amLODipine  10 mg Oral Daily   aspirin EC  81 mg Oral QHS   bisoprolol  10 mg Oral Daily   clobetasol cream   Topical BID   enoxaparin (LOVENOX) injection  40 mg Subcutaneous Q24H   finasteride  5 mg Oral QHS   [START ON 06/03/2023] pantoprazole  40 mg Intravenous Q12H    Continuous Infusions:  ciprofloxacin 400 mg (05/31/23 2350)   metronidazole 500 mg (06/01/23 0734)   pantoprazole 8 mg/hr (06/01/23 0730)     LOS: 2 days    Time spent: 45 minutes spent on chart review, discussion with nursing staff, consultants, updating family and interview/physical exam; more than 50% of that time was spent in counseling and/or coordination of care.    Joseph Art, DO [facility] Available via Epic secure chat 7am-7pm After these hours, please refer to coverage provider listed on [email or web address] 06/01/2023, 11:14 AM

## 2023-06-01 NOTE — Progress Notes (Signed)
[facility] Gastroenterology Progress Note  TATUM SRADER 77 y.o. 1946/01/27   Subjective: Feeling good. Tolerating full liquids. Denies abdominal pain. Sitting in bedside chair. Wife in room.  Objective: Vital signs: Vitals:   06/01/23 0505 06/01/23 0929  BP: 129/81 139/72  Pulse: (!) 54 (!) 50  Resp: 18 16  Temp: 97.9 F (36.6 C) 98 F (36.7 C)  SpO2: 97% 99%    Physical Exam: Gen: alert, no acute distress, well nourished, pleasant HEENT: anicteric sclera CV: RRR Chest: CTA B Abd: soft, nontender, nondistended, +BS Ext: no edema  Lab Results: Recent Labs    05/31/23 0736 06/01/23 0807  NA 140 138  K 3.7 3.3*  CL 103 108  CO2 24 22  GLUCOSE 104* 102*  BUN 18 13  CREATININE 1.12 1.06  CALCIUM 8.8* 8.0*   Recent Labs    05/31/23 0736 06/01/23 0807  AST 17 13*  ALT 16 15  ALKPHOS 38 31*  BILITOT 0.8 0.7  PROT 6.4* 5.6*  ALBUMIN 3.4* 3.0*   Recent Labs    05/31/23 0736 06/01/23 0807  WBC 7.7 6.0  HGB 14.1 12.6*  HCT 44.1 39.0  MCV 84.0 83.7  PLT 195 162      Assessment/Plan: Large duodenal ulcer with stigmata of recent bleeding - biopsies pending. Continue Protonix drip today. Soft diet in AM and if tolerates then home tomorrow on Pantoprazole 40 mg BID for 3 months and then each day. Repeat EGD in November (our office will arrange). Avoid NSAIDs. F/U with me in 4-6 weeks. Office will call him with biopsy results when complete. Will sign off. Call if questions.    Shirley Friar 06/01/2023, 12:02 PM  Questions please call [phone] ID: Ruben Rodriguez, male   DOB: [birth year], 77 y.o.   MRN: [id number]

## 2023-06-01 NOTE — Plan of Care (Signed)
  Problem: Education: Goal: Knowledge of General Education information will improve Description: Including pain rating scale, medication(s)/side effects and non-pharmacologic comfort measures Outcome: Progressing   Problem: Clinical Measurements: Goal: Will remain free from infection Outcome: Progressing Goal: Respiratory complications will improve Outcome: Progressing   Problem: Activity: Goal: Risk for activity intolerance will decrease Outcome: Progressing   Problem: Coping: Goal: Level of anxiety will decrease Outcome: Progressing   Problem: Pain Managment: Goal: General experience of comfort will improve Outcome: Progressing

## 2023-06-01 NOTE — Anesthesia Postprocedure Evaluation (Signed)
Anesthesia Post Note  Patient: Ruben Rodriguez [facility]  Procedure(s) Performed: ESOPHAGOGASTRODUODENOSCOPY (EGD) WITH PROPOFOL BIOPSY     Patient location during evaluation: Endoscopy Anesthesia Type: MAC Level of consciousness: awake and alert Pain management: pain level controlled Vital Signs Assessment: post-procedure vital signs reviewed and stable Respiratory status: spontaneous breathing, nonlabored ventilation and respiratory function stable Cardiovascular status: stable and blood pressure returned to baseline Postop Assessment: no apparent nausea or vomiting Anesthetic complications: no   No notable events documented.  Last Vitals:  Vitals:   06/01/23 0505 06/01/23 0929  BP: 129/81 139/72  Pulse: (!) 54 (!) 50  Resp: 18 16  Temp: 36.6 C 36.7 C  SpO2: 97% 99%    Last Pain:  Vitals:   06/01/23 0929  TempSrc: Oral  PainSc:                  Ruben Rodriguez

## 2023-06-01 NOTE — Plan of Care (Signed)
  Problem: Education: Goal: Knowledge of General Education information will improve Description: Including pain rating scale, medication(s)/side effects and non-pharmacologic comfort measures Outcome: Progressing   Problem: Clinical Measurements: Goal: Ability to maintain clinical measurements within normal limits will improve Outcome: Progressing Goal: Will remain free from infection Outcome: Progressing Goal: Diagnostic test results will improve Outcome: Progressing Goal: Respiratory complications will improve Outcome: Progressing   Problem: Activity: Goal: Risk for activity intolerance will decrease Outcome: Progressing   Problem: Nutrition: Goal: Adequate nutrition will be maintained Outcome: Progressing   Problem: Coping: Goal: Level of anxiety will decrease Outcome: Progressing   Problem: Elimination: Goal: Will not experience complications related to bowel motility Outcome: Progressing

## 2023-06-01 NOTE — Progress Notes (Signed)
1 Day Post-Op  Subjective: Feeling well today.  Didn't sleep well last night due to multiple things keeping him awake.  Tolerating CLD with no issues  ROS: See above, otherwise other systems negative  Objective: Vital signs in last 24 hours: Temp:  [97.1 F (36.2 C)-97.9 F (36.6 C)] 97.9 F (36.6 C) (09/05 0505) Pulse Rate:  [50-58] 54 (09/05 0505) Resp:  [12-20] 18 (09/05 0505) BP: (105-149)/(71-84) 129/81 (09/05 0505) SpO2:  [94 %-100 %] 97 % (09/05 0505) Weight:  [95.3 kg] 95.3 kg (09/04 0912) Last BM Date : 05/29/23  Intake/Output from previous day: 09/04 0701 - 09/05 0700 In: 1289.4 [P.O.:360; I.V.:382.6; IV Piggyback:546.8] Out: -  Intake/Output this shift: No intake/output data recorded.  PE: Gen: NAD Abd: soft, but protuberant, minimal central/epigastric tenderness  Lab Results:  Recent Labs    05/29/23 2009 05/31/23 0736  WBC 11.4* 7.7  HGB 14.7 14.1  HCT 44.6 44.1  PLT 234 195   BMET Recent Labs    05/29/23 2009 05/31/23 0736  NA 141 140  K 3.6 3.7  CL 98 103  CO2 32 24  GLUCOSE 148* 104*  BUN 32* 18  CREATININE 1.18 1.12  CALCIUM 9.6 8.8*   PT/INR No results for input(s): "LABPROT", "INR" in the last 72 hours. CMP     Component Value Date/Time   NA 140 05/31/2023 0736   NA 142 08/13/2021 0817   K 3.7 05/31/2023 0736   CL 103 05/31/2023 0736   CO2 24 05/31/2023 0736   GLUCOSE 104 (H) 05/31/2023 0736   BUN 18 05/31/2023 0736   BUN 19 08/13/2021 0817   CREATININE 1.12 05/31/2023 0736   CALCIUM 8.8 (L) 05/31/2023 0736   PROT 6.4 (L) 05/31/2023 0736   PROT 6.4 10/04/2021 0942   ALBUMIN 3.4 (L) 05/31/2023 0736   ALBUMIN 4.2 10/04/2021 0942   AST 17 05/31/2023 0736   ALT 16 05/31/2023 0736   ALKPHOS 38 05/31/2023 0736   BILITOT 0.8 05/31/2023 0736   BILITOT 0.6 10/04/2021 0942   GFRNONAA >60 05/31/2023 0736   GFRAA >60 07/20/2018 1112   Lipase     Component Value Date/Time   LIPASE 28 05/29/2023 2009        Studies/Results: No results found.  Anti-infectives: Anti-infectives (From admission, onward)    Start     Dose/Rate Route Frequency Ordered Stop   05/30/23 1200  ciprofloxacin (CIPRO) IVPB 400 mg        400 mg 200 mL/hr over 60 Minutes Intravenous Every 12 hours 05/30/23 0403 06/06/23 1159   05/30/23 0800  metroNIDAZOLE (FLAGYL) IVPB 500 mg        500 mg 100 mL/hr over 60 Minutes Intravenous Every 8 hours 05/30/23 0403 06/06/23 0759   05/30/23 0500  ciprofloxacin (CIPRO) IVPB 400 mg  Status:  Discontinued        400 mg 200 mL/hr over 60 Minutes Intravenous Every 12 hours 05/30/23 0402 05/30/23 0403   05/30/23 0100  ciprofloxacin (CIPRO) IVPB 400 mg        400 mg 200 mL/hr over 60 Minutes Intravenous  Once 05/30/23 0054 05/30/23 0227   05/30/23 0100  metroNIDAZOLE (FLAGYL) IVPB 500 mg        500 mg 100 mL/hr over 60 Minutes Intravenous  Once 05/30/23 0054 05/30/23 0329        Assessment/Plan Duodenitis secondary to ulcer disease -EGD findings noted. -cont PPI -would treat prophylactically for 7 days total of abx due to questionable  intramural abscess noted on CT scan -may advance diet from surgical standpoint -will defer further treatment to GI moving forward. -no surgical intervention warranted at this time.  We will sign off, but we are available as needed.  FEN - IVFs/CLD VTE - Lovenox ID - Cipro/Flagyl  HLD HTN BPH  I reviewed Consultant GI notes, hospitalist notes, last 24 h vitals and pain scores, last 48 h intake and output, last 24 h labs and trends, and last 24 h imaging results.   LOS: 2 days    Letha Cape , [facility] 06/01/2023, 8:07 AM Please see Amion for pager number during day hours 7:00am-4:30pm or 7:00am -11:30am on weekends

## 2023-06-01 NOTE — Progress Notes (Signed)
   06/01/23 1248  TOC Brief Assessment  Insurance and Status Reviewed  Patient has primary care physician Yes  Home environment has been reviewed yes  Prior level of function: independent  Prior/Current Home Services No current home services  Social Determinants of Health Reivew SDOH reviewed no interventions necessary  Readmission risk has been reviewed Yes  Transition of care needs no transition of care needs at this time    Soft diet in AM and if tolerates then home tomorrow

## 2023-06-02 ENCOUNTER — Other Ambulatory Visit (HOSPITAL_COMMUNITY): Payer: Self-pay

## 2023-06-02 DIAGNOSIS — K630 Abscess of intestine: Secondary | ICD-10-CM | POA: Diagnosis not present

## 2023-06-02 LAB — CBC
HCT: 42.4 % (ref 39.0–52.0)
Hemoglobin: 13.4 g/dL (ref 13.0–17.0)
MCH: 26 pg (ref 26.0–34.0)
MCHC: 31.6 g/dL (ref 30.0–36.0)
MCV: 82.2 fL (ref 80.0–100.0)
Platelets: 206 10*3/uL (ref 150–400)
RBC: 5.16 MIL/uL (ref 4.22–5.81)
RDW: 14.2 % (ref 11.5–15.5)
WBC: 6.6 10*3/uL (ref 4.0–10.5)
nRBC: 0 % (ref 0.0–0.2)

## 2023-06-02 LAB — COMPREHENSIVE METABOLIC PANEL
ALT: 24 U/L (ref 0–44)
AST: 25 U/L (ref 15–41)
Albumin: 3.3 g/dL — ABNORMAL LOW (ref 3.5–5.0)
Alkaline Phosphatase: 36 U/L — ABNORMAL LOW (ref 38–126)
Anion gap: 10 (ref 5–15)
BUN: 12 mg/dL (ref 8–23)
CO2: 20 mmol/L — ABNORMAL LOW (ref 22–32)
Calcium: 8.3 mg/dL — ABNORMAL LOW (ref 8.9–10.3)
Chloride: 109 mmol/L (ref 98–111)
Creatinine, Ser: 1.06 mg/dL (ref 0.61–1.24)
GFR, Estimated: >60 mL/min (ref >60)
Glucose, Bld: 104 mg/dL — ABNORMAL HIGH (ref 70–99)
Potassium: 4 mmol/L (ref 3.5–5.1)
Sodium: 139 mmol/L (ref 135–145)
Total Bilirubin: 0.8 mg/dL (ref 0.3–1.2)
Total Protein: 6.1 g/dL — ABNORMAL LOW (ref 6.5–8.1)

## 2023-06-02 MED ORDER — METRONIDAZOLE 500 MG PO TABS: 500.0000 mg | ORAL_TABLET | Freq: Two times a day (BID) | ORAL | Status: DC

## 2023-06-02 MED ORDER — PANTOPRAZOLE SODIUM 40 MG PO TBEC
40.0000 mg | DELAYED_RELEASE_TABLET | Freq: Two times a day (BID) | ORAL | 0 refills | Status: DC
  Filled 2023-06-02: qty 200, 100d supply, fill #0

## 2023-06-02 MED ORDER — PANTOPRAZOLE SODIUM 40 MG PO TBEC
40.0000 mg | DELAYED_RELEASE_TABLET | Freq: Two times a day (BID) | ORAL | Status: DC
  Administered 2023-06-02: 40 mg via ORAL
  Filled 2023-06-02: qty 1

## 2023-06-02 MED ORDER — METRONIDAZOLE 500 MG PO TABS
500.0000 mg | ORAL_TABLET | Freq: Two times a day (BID) | ORAL | 0 refills | Status: DC
  Filled 2023-06-02: qty 6, 3d supply, fill #0

## 2023-06-02 MED ORDER — CIPROFLOXACIN HCL 500 MG PO TABS
500.0000 mg | ORAL_TABLET | Freq: Two times a day (BID) | ORAL | Status: DC
  Administered 2023-06-02: 500 mg via ORAL
  Filled 2023-06-02: qty 1

## 2023-06-02 MED ORDER — CLOBETASOL PROPIONATE 0.05 % EX OINT
TOPICAL_OINTMENT | Freq: Two times a day (BID) | CUTANEOUS | 0 refills | Status: AC
Start: 2023-06-02 — End: 2023-06-17
  Filled 2023-06-02: qty 30, 15d supply, fill #0

## 2023-06-02 MED ORDER — CIPROFLOXACIN HCL 500 MG PO TABS
500.0000 mg | ORAL_TABLET | Freq: Two times a day (BID) | ORAL | 0 refills | Status: DC
  Filled 2023-06-02: qty 6, 3d supply, fill #0

## 2023-06-02 NOTE — Discharge Summary (Signed)
Physician Discharge Summary  Ruben Rodriguez [facility] ZOX:[id number] DOB: 1946/05/13 DOA: 05/29/2023  PCP: Lupita Raider, MD  Admit date: 05/29/2023 Discharge date: 06/02/2023  Admitted From: home Discharge disposition: home   Recommendations for Outpatient Follow-Up:   Pantoprazole 40 mg BID for 3 months and then each day. Repeat EGD in November . Avoid NSAIDs  Biopsy pending   Discharge Diagnosis:   Principal Problem:   Duodenal abscess Active Problems:   Duodenitis   BPH (benign prostatic hyperplasia)   Essential hypertension   Hyperlipidemia   History of CAD (coronary artery disease)    Discharge Condition: Improved.  Diet recommendation: Regular.  Wound care: None.  Code status: Full.   History of Present Illness:   Ruben Rodriguez is a 77 y.o. male with medical history significant of hyperlipidemia, CAD on medical management with aspirin and statin, essential hypertension and BPH present to emergency department complaining of abdominal pain.  Patient reported he has 2 to 3 weeks of epigastric and right lower quadrant abdominal pain.  Reported earlier in the afternoon did after his lunch he has few episodes of vomiting where he felt like that a lot of gastric acid and he was experiencing a lot of heartburn.  Patient denies any use of over-the-counter ibuprofen, Goody powder, BC powder, and Motrin. Patient reported previous history of EGD 1968 however unsure about the finding.   Hospital Course by Problem:   Duodenal ulcers Pantoprazole 40 mg BID for 3 months and then each day. Repeat EGD in November . Avoid NSAIDs  -GI consult appreciated -biopsy pending   History of essential hypertension -Resume home blood pressure regimen amlodipine 10 mg daily and bisoprolol-hydrochlorothiazide combination 10-6.25 mg daily.    History of CAD on medical management -No chest pain. Patient has history of CAD on medical management with aspirin.  Last seen cardiology on 07/2022.   Recommended if patient develop any significant chest pain in that case will perform the LAD.  Patient has history of intolerance to statin.  Currently on Evolosumab 140 mg every 2 weeks.     Hyperlipidemia -Continue outpatient  Evolosumab 140 mg every 2 weeks.   Hypokalemia -repelted   BPH -Continue finasteride 5 mg daily   ? Contact dermatitis -added steroid cream after shower and linen change    Medical Consultants:   [facility]   Discharge Exam:   Vitals:   06/02/23 0417 06/02/23 0745  BP: 128/71 (!) 140/85  Pulse: (!) 52 (!) 56  Resp: 16 16  Temp: 97.7 F (36.5 C) 97.8 F (36.6 C)  SpO2: 98% 98%   Vitals:   06/01/23 1531 06/01/23 2141 06/02/23 0417 06/02/23 0745  BP: 109/68 (!) 147/75 128/71 (!) 140/85  Pulse: (!) 47 (!) 51 (!) 52 (!) 56  Resp: 18 16 16 16   Temp: 98.5 F (36.9 C) 98 F (36.7 C) 97.7 F (36.5 C) 97.8 F (36.6 C)  TempSrc: Oral Oral Oral Oral  SpO2: 97% 100% 98% 98%  Weight:      Height:        General exam: Appears calm and comfortable.   The results of significant diagnostics from this hospitalization (including imaging, microbiology, ancillary and laboratory) are listed below for reference.     Procedures and Diagnostic Studies:   CT ABDOMEN PELVIS W CONTRAST  Result Date: 05/30/2023 CLINICAL DATA:  Abdominal pain EXAM: CT ABDOMEN AND PELVIS WITH CONTRAST TECHNIQUE: Multidetector CT imaging of the abdomen and pelvis was performed using the standard  protocol following bolus administration of intravenous contrast. RADIATION DOSE REDUCTION: This exam was performed according to the departmental dose-optimization program which includes automated exposure control, adjustment of the mA and/or kV according to patient size and/or use of iterative reconstruction technique. CONTRAST:  90mL OMNIPAQUE IOHEXOL 350 MG/ML SOLN COMPARISON:  None Available. FINDINGS: Lower chest: No acute abnormality Hepatobiliary: No suspicious focal hepatic abnormality.  Gallbladder unremarkable. No biliary ductal dilatation. Pancreas: No focal abnormality or ductal dilatation. Spleen: No focal abnormality.  Normal size. Adrenals/Urinary Tract: 3.7 cm simple appearing cyst in the midpole of the right kidney. Smaller cysts bilaterally. No follow-up imaging recommended. No stones or hydronephrosis. Adrenal glands and urinary bladder unremarkable. Stomach/Bowel: Diffuse colonic diverticulosis. No active diverticulitis. Normal appendix. There is mild wall thickening and surrounding inflammation involving the proximal duodenum concerning for duodenitis. There appears to be a small fluid collection with locule of gas within the wall which could reflect a small intramural abscess or ulcer. Stomach is moderately distended and fluid-filled. Vascular/Lymphatic: Aortic atherosclerosis. No evidence of aneurysm or adenopathy. Reproductive: No visible focal abnormality. Other: No free fluid or free air. Musculoskeletal: No acute bony abnormality. IMPRESSION: Wall thickening and surrounding inflammatory change involving the proximal duodenum compatible with duodenitis. Possible ulcer versus intramural abscess seen within the first or second portion of the duodenum. Mild distention of the stomach with fluid. Colonic diverticulosis.  No active diverticulitis. Aortic atherosclerosis. Electronically Signed   By: Charlett Nose M.D.   On: 05/30/2023 00:14     Labs:   Basic Metabolic Panel: Recent Labs  Lab 05/29/23 2009 05/31/23 0736 06/01/23 0807  NA 141 140 138  K 3.6 3.7 3.3*  CL 98 103 108  CO2 32 24 22  GLUCOSE 148* 104* 102*  BUN 32* 18 13  CREATININE 1.18 1.12 1.06  CALCIUM 9.6 8.8* 8.0*   GFR Estimated Creatinine Clearance: 67.5 mL/min (by C-G formula based on SCr of 1.06 mg/dL). Liver Function Tests: Recent Labs  Lab 05/29/23 2009 05/31/23 0736 06/01/23 0807  AST 13* 17 13*  ALT 16 16 15   ALKPHOS 35* 38 31*  BILITOT 0.5 0.8 0.7  PROT 7.0 6.4* 5.6*  ALBUMIN 4.2  3.4* 3.0*   Recent Labs  Lab 05/29/23 2009  LIPASE 28   No results for input(s): "AMMONIA" in the last 168 hours. Coagulation profile No results for input(s): "INR", "PROTIME" in the last 168 hours.  CBC: Recent Labs  Lab 05/29/23 2009 05/31/23 0736 06/01/23 0807  WBC 11.4* 7.7 6.0  HGB 14.7 14.1 12.6*  HCT 44.6 44.1 39.0  MCV 81.5 84.0 83.7  PLT 234 195 162   Cardiac Enzymes: No results for input(s): "CKTOTAL", "CKMB", "CKMBINDEX", "TROPONINI" in the last 168 hours. BNP: Invalid input(s): "POCBNP" CBG: No results for input(s): "GLUCAP" in the last 168 hours. D-Dimer No results for input(s): "DDIMER" in the last 72 hours. Hgb A1c No results for input(s): "HGBA1C" in the last 72 hours. Lipid Profile No results for input(s): "CHOL", "HDL", "LDLCALC", "TRIG", "CHOLHDL", "LDLDIRECT" in the last 72 hours. Thyroid function studies No results for input(s): "TSH", "T4TOTAL", "T3FREE", "THYROIDAB" in the last 72 hours.  Invalid input(s): "FREET3" Anemia work up No results for input(s): "VITAMINB12", "FOLATE", "FERRITIN", "TIBC", "IRON", "RETICCTPCT" in the last 72 hours. Microbiology Recent Results (from the past 240 hour(s))  Culture, blood (Routine X 2) w Reflex to ID Panel     Status: None (Preliminary result)   Collection Time: 05/30/23  5:14 AM   Specimen: BLOOD  Result  Value Ref Range Status   Specimen Description BLOOD BLOOD LEFT ARM  Final   Special Requests   Final    BOTTLES DRAWN AEROBIC AND ANAEROBIC Blood Culture adequate volume   Culture   Final    NO GROWTH 3 DAYS Performed at [facility], [address], [place], [place] [address]    Report Status PENDING  Incomplete  Culture, blood (Routine X 2) w Reflex to ID Panel     Status: None (Preliminary result)   Collection Time: 05/30/23  5:14 AM   Specimen: BLOOD  Result Value Ref Range Status   Specimen Description BLOOD BLOOD LEFT HAND  Final   Special Requests   Final    BOTTLES DRAWN AEROBIC  AND ANAEROBIC Blood Culture adequate volume   Culture   Final    NO GROWTH 3 DAYS Performed at [facility], [address], [place], [place] [address]    Report Status PENDING  Incomplete     Discharge Instructions:   Discharge Instructions     Diet general   Complete by: As directed    Discharge instructions   Complete by: As directed    Protonix BID x 3 months then daily after that Avoid alcohol while taking flagyl Avoid NSAIDs   Increase activity slowly   Complete by: As directed       Allergies as of 06/02/2023       Reactions   Penicillins Anaphylaxis   Atorvastatin Other (See Comments)   Muscle aches   Ezetimibe Other (See Comments)   Muscle aches   Zocor [simvastatin] Other (See Comments)   Muscle aches   Amlodipine Swelling, Other (See Comments)   Takes amlodipine at home 05/30/23   Amoxicillin Other (See Comments)   MD advised patient to not take this, either   Crestor [rosuvastatin Calcium] Other (See Comments)   Joint and muscle pain   Other Rash, Other (See Comments)   FISH- No grouper or trout        Medication List     STOP taking these medications    diclofenac 75 MG EC tablet Commonly known as: VOLTAREN   ibuprofen 200 MG tablet Commonly known as: ADVIL       TAKE these medications    acetaminophen 650 MG CR tablet Commonly known as: TYLENOL Take 1,300 mg by mouth every 8 (eight) hours as needed for pain.   amLODipine 10 MG tablet Commonly known as: NORVASC Take 10 mg by mouth daily.   aspirin EC 81 MG tablet Take 81 mg by mouth daily. Swallow whole.   bisoprolol-hydrochlorothiazide 10-6.25 MG tablet Commonly known as: ZIAC Take 1 tablet by mouth daily.   ciprofloxacin 500 MG tablet Commonly known as: CIPRO Take 1 tablet (500 mg total) by mouth 2 (two) times daily.   clobetasol cream 0.05 % Commonly known as: TEMOVATE Apply topically 2 (two) times daily for 6 days.   finasteride 5 MG tablet Commonly known as:  PROSCAR Take 5 mg by mouth at bedtime.   Lubricant Eye Drops 0.4-0.3 % Soln Generic drug: Polyethyl Glycol-Propyl Glycol Place 1-2 drops into both eyes 3 (three) times daily as needed (dry/irritated eyes.).   metroNIDAZOLE 500 MG tablet Commonly known as: FLAGYL Take 1 tablet (500 mg total) by mouth every 12 (twelve) hours.   pantoprazole 40 MG tablet Commonly known as: PROTONIX Take 1 tablet (40 mg total) by mouth 2 (two) times daily.   Repatha SureClick 140 MG/ML Soaj Generic drug:  Evolocumab Inject 140 mg into the skin every 14 (fourteen) days.          Time coordinating discharge: 45 min  Signed:  Joseph Art DO  [facility] 06/02/2023, 9:13 AM

## 2023-06-02 NOTE — Plan of Care (Signed)

## 2023-06-02 NOTE — Care Management Important Message (Signed)
Important Message  Patient Details  Name: Ruben Rodriguez MRN: [id number] Date of Birth: 03-18-1946   Medicare Important Message Given:  Yes     Sherilyn Banker 06/02/2023, 2:42 PM

## 2023-06-04 LAB — CULTURE, BLOOD (ROUTINE X 2)
Culture: NO GROWTH
Culture: NO GROWTH
Special Requests: ADEQUATE
Special Requests: ADEQUATE

## 2023-06-05 ENCOUNTER — Encounter (HOSPITAL_COMMUNITY): Payer: Self-pay | Admitting: Gastroenterology

## 2023-06-06 DIAGNOSIS — K269 Duodenal ulcer, unspecified as acute or chronic, without hemorrhage or perforation: Secondary | ICD-10-CM | POA: Diagnosis not present

## 2023-06-06 DIAGNOSIS — E782 Mixed hyperlipidemia: Secondary | ICD-10-CM | POA: Diagnosis not present

## 2023-06-06 DIAGNOSIS — E876 Hypokalemia: Secondary | ICD-10-CM | POA: Diagnosis not present

## 2023-06-06 DIAGNOSIS — D649 Anemia, unspecified: Secondary | ICD-10-CM | POA: Diagnosis not present

## 2023-06-06 LAB — LAB REPORT - SCANNED: EGFR: 40

## 2023-06-21 ENCOUNTER — Telehealth: Payer: Self-pay | Admitting: Cardiology

## 2023-06-21 DIAGNOSIS — N179 Acute kidney failure, unspecified: Secondary | ICD-10-CM | POA: Diagnosis not present

## 2023-06-21 DIAGNOSIS — E782 Mixed hyperlipidemia: Secondary | ICD-10-CM

## 2023-06-21 DIAGNOSIS — I2510 Atherosclerotic heart disease of native coronary artery without angina pectoris: Secondary | ICD-10-CM

## 2023-06-21 MED ORDER — REPATHA SURECLICK 140 MG/ML ~~LOC~~ SOAJ
1.0000 | SUBCUTANEOUS | 5 refills | Status: DC
Start: 2023-06-21 — End: 2023-08-23

## 2023-06-21 NOTE — Telephone Encounter (Signed)
*  STAT* If patient is at the pharmacy, call can be transferred to refill team.   1. Which medications need to be refilled? (please list name of each medication and dose if known)   Evolocumab (REPATHA SURECLICK) 140 MG/ML SOAJ    2. Which pharmacy/location (including street and city if local pharmacy) is medication to be sent to?[id number] - [place], [place] - [address]   3. Do they need a 30 day or 90 day supply? 30 day

## 2023-06-23 NOTE — Telephone Encounter (Signed)
Refilled on 9/25

## 2023-08-07 DIAGNOSIS — K279 Peptic ulcer, site unspecified, unspecified as acute or chronic, without hemorrhage or perforation: Secondary | ICD-10-CM | POA: Diagnosis not present

## 2023-08-18 ENCOUNTER — Ambulatory Visit: Payer: Medicare HMO | Attending: Cardiology | Admitting: Nurse Practitioner

## 2023-08-18 ENCOUNTER — Encounter: Payer: Self-pay | Admitting: Nurse Practitioner

## 2023-08-18 ENCOUNTER — Telehealth: Payer: Self-pay

## 2023-08-18 VITALS — BP 130/76 | HR 58 | Ht 69.0 in | Wt 219.0 lb

## 2023-08-18 DIAGNOSIS — I25118 Atherosclerotic heart disease of native coronary artery with other forms of angina pectoris: Secondary | ICD-10-CM | POA: Diagnosis not present

## 2023-08-18 DIAGNOSIS — R0609 Other forms of dyspnea: Secondary | ICD-10-CM | POA: Diagnosis not present

## 2023-08-18 DIAGNOSIS — I10 Essential (primary) hypertension: Secondary | ICD-10-CM

## 2023-08-18 DIAGNOSIS — R5383 Other fatigue: Secondary | ICD-10-CM | POA: Diagnosis not present

## 2023-08-18 DIAGNOSIS — E785 Hyperlipidemia, unspecified: Secondary | ICD-10-CM

## 2023-08-18 NOTE — H&P (View-Only) (Signed)
 Office Visit    Patient Name: Ruben Rodriguez Date of Encounter: 08/18/2023  Primary Care Provider:  Lupita Raider, MD Primary Cardiologist:  Olga Millers, MD  Chief Complaint    77 year old male with a history of CAD, hypertension, and hyperlipidemia who presents for follow-up related to CAD.  Past Medical History    Past Medical History:  Diagnosis Date   Arthritis    BPH (benign prostatic hyperplasia)    Chronic back pain    Family history of adverse reaction to anesthesia    father died during carotid artery surgery   GERD (gastroesophageal reflux disease)    Hyperlipidemia    Hypertension    Pneumonia    Spinal stenosis    Past Surgical History:  Procedure Laterality Date   BIOPSY  05/31/2023   Procedure: BIOPSY;  Surgeon: Charlott Rakes, MD;  Location: [facility] ENDOSCOPY;  Service: Gastroenterology;;   COLONOSCOPY     ESOPHAGOGASTRODUODENOSCOPY (EGD) WITH PROPOFOL N/A 05/31/2023   Procedure: ESOPHAGOGASTRODUODENOSCOPY (EGD) WITH PROPOFOL;  Surgeon: Charlott Rakes, MD;  Location: [facility] ENDOSCOPY;  Service: Gastroenterology;  Laterality: N/A;   FINGER SURGERY     to remove a tumor from right little finger   LAMINECTOMY WITH POSTERIOR LATERAL ARTHRODESIS LEVEL 2 N/A 08/01/2018   Procedure: LUMBAR THREE- LUMBAR FOUR, LUMBAR FOUR-LUMBAR FIVE DECOMPRESSION, LUMBAR THREE-LUMBAR FOUR, LUMBAR FOUR-LUMBAR FIVE POSTEROLATERAL ARTHRODESIS;  Surgeon: Shirlean Kelly, MD;  Location: [facility] OR;  Service: Neurosurgery;  Laterality: N/A;   LEFT HEART CATH AND CORONARY ANGIOGRAPHY N/A 08/27/2021   Procedure: LEFT HEART CATH AND CORONARY ANGIOGRAPHY;  Surgeon: Tonny Bollman, MD;  Location: [facility] INVASIVE CV LAB;  Service: Cardiovascular;  Laterality: N/A;    Allergies  Allergies  Allergen Reactions   Penicillins Anaphylaxis   Atorvastatin Other (See Comments)    Muscle aches   Ezetimibe Other (See Comments)    Muscle aches   Zocor [Simvastatin] Other (See Comments)    Muscle aches    Amlodipine Swelling and Other (See Comments)    Takes amlodipine at home 05/30/23   Amoxicillin Other (See Comments)    MD advised patient to not take this, either   Crestor [Rosuvastatin Calcium] Other (See Comments)    Joint and muscle pain   Other Rash and Other (See Comments)    FISH- No grouper or trout      Labs/Other Studies Reviewed    The following studies were reviewed today:  Cardiac Studies & Procedures   CARDIAC CATHETERIZATION  CARDIAC CATHETERIZATION 08/27/2021  Narrative   1st RPL lesion is 90% stenosed.   Mid LAD-1 lesion is 50% stenosed.   Mid LAD-2 lesion is 80% stenosed.   Dist LAD lesion is 60% stenosed.  1.  Patent left main 2.  Severe mid LAD stenosis segmentally through the mid and distal vessel, but widely patent proximal LAD and large first diagonal branches noted. 3.  Patent left circumflex with mild luminal irregularity 4.  Patent RCA with mild diffuse plaquing and severe stenosis of the first posterolateral branch 5.  Low LVEDP  Favor medical therapy: Patient would require long segment stenting of multiple lesions in the mid and distal LAD in order to treat that segment of disease that is hemodynamically significant by CT-FFR.  The distal vessel is small in caliber.  The patient is having no angina.  I think it is most prudent to treat him medically.  If he has any high risk features over time on surveillance treadmill testing or if he develops angina, PCI  is technically feasible.  Findings Coronary Findings Diagnostic  Dominance: Right  Left Main The vessel exhibits minimal luminal irregularities.  Left Anterior Descending Mid LAD-1 lesion is 50% stenosed. Mid LAD-2 lesion is 80% stenosed. Dist LAD lesion is 60% stenosed.  Left Circumflex The vessel exhibits minimal luminal irregularities.  Right Coronary Artery Vessel is large. There is mild diffuse disease throughout the vessel. Large, dominant vessel with diffuse plaquing present.   There are no high-grade stenoses throughout the proximal, mid, or distal vessel.  The vessel supplies a PDA and a PLA branch.  The PDA is patent with no significant stenosis.  The first posterolateral branch has a 90% stenosis.  First Right Posterolateral Branch 1st RPL lesion is 90% stenosed.  Intervention  No interventions have been documented.        CT SCANS  CT CORONARY MORPH W/CTA COR W/SCORE 08/16/2021  Addendum 08/16/2021 11:36 AM ADDENDUM REPORT: 08/16/2021 11:33  CLINICAL DATA:  Chest pain  EXAM: Cardiac/Coronary CTA  TECHNIQUE: A non-contrast, gated CT scan was obtained with axial slices of 3 mm through the heart for calcium scoring. Calcium scoring was performed using the Agatston method. A 100 kV prospective, gated, contrast cardiac scan was obtained. Gantry rotation speed was 250 msecs and collimation was 0.6 mm. Two sublingual nitroglycerin tablets (0.8 mg) were given. The 3D data set was reconstructed in 5% intervals of the 35-75% of the R-R cycle. Diastolic phases were analyzed on a dedicated workstation using MPR, MIP, and VRT modes. The patient received 95 cc of contrast.  FINDINGS: Image quality: Excellent.  Noise artifact is: Limited.  Coronary Arteries:  Normal coronary origin.  Right dominance.  Left main: The left main is a large caliber vessel with a normal take off from the left coronary cusp that bifurcates to form a left anterior descending artery and a left circumflex artery. There is minimal calcified plaque (<25%).  Left anterior descending artery: The proximal LAD contains minimal calcified plaque (<25%). The mid LAD contains a severe mixed density plaque (70-99%). The distal LAD appears patent. The LAD gives off 1 diagonal branch with minimal calcified plaque (<25%).  Left circumflex artery: The LCX is non-dominant. There is no plaque or stenosis. OM1 contains minimal calcified plaque (<25%).  Right coronary artery: The RCA is  dominant with normal take off from the right coronary cusp. The proximal RCA contains minimal calcified plaque (<25%). The mid RCA contains mild to moderate mixed density plaque (40-50%). The distal RCA contains minimal mixed density plaque (<25%). The PDA contains mild calcified plaque (25-49%). The PLV contains moderate non-calcified plaque (50-69%).  Right Atrium: Right atrial size is within normal limits.  Right Ventricle: The right ventricular cavity is within normal limits.  Left Atrium: Left atrial size is normal in size with no left atrial appendage filling defect. A small PFO is present.  Left Ventricle: The ventricular cavity size is within normal limits. There are no stigmata of prior infarction. There is no abnormal filling defect.  Pulmonary arteries: Normal in size without proximal filling defect.  Pulmonary veins: Normal pulmonary venous drainage.  Pericardium: Normal thickness with no significant effusion or calcium present.  Cardiac valves: The aortic valve is trileaflet without significant calcification. The mitral valve is normal structure without significant calcification.  Aorta: Normal caliber with no significant disease.  Extra-cardiac findings: See attached radiology report for non-cardiac structures.  IMPRESSION: 1. Coronary calcium score of 429. This was 30 percentile for age-, sex, and race-matched controls.  2. Normal  coronary origin with right dominance.  3. Severe mixed density stenosis in the mid LAD (70-99%).  4. Minimal calcified plaque (<25%) in the LCX.  5. Mild to moderate mid RCA mixed density plaque (40-50%).  6. Mild calcified plaque in the PDA (25-49%).  7. Moderate non-calcified plaque (50-69%) in the PLV.  RECOMMENDATIONS: 1. Severe stenosis. CT FFR will be submitted for the RCA. Consider symptom-guided anti-ischemic pharmacotherapy as well as risk factor modification per guideline directed care. Invasive  coronary angiography recommended with revascularization per published guideline statements.  Lennie Odor, MD   Electronically Signed By: Lennie Odor M.D. On: 08/16/2021 11:33  Narrative EXAM: OVER-READ INTERPRETATION  CT CHEST  The following report is an over-read performed by radiologist Dr. Charlett Nose of [facility] on 08/16/2021. This over-read does not include interpretation of cardiac or coronary anatomy or pathology. The coronary CTA interpretation by the cardiologist is attached.  COMPARISON:  06/16/2021  FINDINGS: Vascular: Heart is normal size.  Aorta normal caliber.  Mediastinum/Nodes: No adenopathy  Lungs/Pleura: No confluent opacities or effusions.  Upper Abdomen: No acute findings  Musculoskeletal: Chest wall soft tissues are unremarkable. No acute bony abnormality.  IMPRESSION: No acute or significant extracardiac abnormality.  Electronically Signed: By: Charlett Nose M.D. On: 08/16/2021 09:11         Recent Labs: 06/02/2023: ALT 24; BUN 12; Creatinine, Ser 1.06; Hemoglobin 13.4; Platelets 206; Potassium 4.0; Sodium 139  Recent Lipid Panel    Component Value Date/Time   CHOL 106 10/04/2021 0942   TRIG 115 10/04/2021 0942   HDL 40 10/04/2021 0942   CHOLHDL 2.7 10/04/2021 0942   LDLCALC 45 10/04/2021 0942    History of Present Illness    77 year old male with the above past medical history including CAD, hypertension, and hyperlipidemia.  Renal Dopplers in 2018 showed evidence of renal artery stent.  Coronary score in September 2022 was 561 (60th percentile).  There was also evidence of aortic atherosclerosis.  Coronary CT angiogram in 08/15/2021 revealed coronary calcium score 429 with severe stenosis in the mid LAD, mild to moderate mid coronary artery disease (40 to 50%), moderate disease in the posterior lateral (50 to 69%), LAD lesion significant by FFR.  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  Cardiac  catheterization in December 2022 revealed 58% mid LAD stenosis, 60% distal LAD stenosis, mild diffuse disease throughout the RCA 90% stenosis of the first posterolateral branch.  Medical therapy was recommended.  It was noted that  should he develop symptoms, PCI of LAD could be performed.  Carotid ultrasound in September 2023 showed no significant stenosis of the internal arteries. 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  He was last seen in the office on 08/17/2022 and was stable from a cardiac standpoint.  He denied symptoms concerning for angina. He was hospitalized in September 2024 in the setting of duodenal ulcers. He was started on pantoprazole.   He presents today for follow-up accompanied by his wife.  Since his last visit he has been stable from a cardiac standpoint.  He does note increased dyspnea on exertion, generalized fatigue, decreased activity tolerance.  He was concerned that his symptoms were related to Repatha.  He stopped taking the medication though he has noticed not much improvement in his symptoms.  He is not interested in restarting PCSK9 inhibitor at this point in time. He is concerned that his recent symptoms could also represent possible anginal equivalent.  Overall, he feels  poorly.  Home Medications    Current Outpatient Medications  Medication Sig Dispense Refill   acetaminophen (TYLENOL) 650 MG CR tablet Take 1,300 mg by mouth every 8 (eight) hours as needed for pain.     amLODipine (NORVASC) 10 MG tablet Take 10 mg by mouth daily.     aspirin EC 81 MG tablet Take 81 mg by mouth daily. Swallow whole.     bisoprolol-hydrochlorothiazide (ZIAC) 10-6.25 MG per tablet Take 1 tablet by mouth daily.     finasteride (PROSCAR) 5 MG tablet Take 5 mg by mouth at bedtime.     pantoprazole (PROTONIX) 40 MG tablet Take 1 tablet (40 mg total) by mouth 2 (two) times daily. 240 tablet 0   Polyethyl Glycol-Propyl Glycol (LUBRICANT EYE  DROPS) 0.4-0.3 % SOLN Place 1-2 drops into both eyes 3 (three) times daily as needed (dry/irritated eyes.).     ciprofloxacin (CIPRO) 500 MG tablet Take 1 tablet (500 mg total) by mouth 2 (two) times daily. (Patient not taking: Reported on 08/18/2023) 6 tablet 0   Evolocumab (REPATHA SURECLICK) 140 MG/ML SOAJ Inject 140 mg into the skin every 14 (fourteen) days. (Patient not taking: Reported on 08/18/2023) 2 mL 5   metroNIDAZOLE (FLAGYL) 500 MG tablet Take 1 tablet (500 mg total) by mouth every 12 (twelve) hours. (Patient not taking: Reported on 08/18/2023) 6 tablet 0   No current facility-administered medications for this visit.     Review of Systems    He denies chest pain, palpitations, pnd, orthopnea, n, v, dizziness, syncope, edema, weight gain, or early satiety. All other systems reviewed and are otherwise negative except as noted above.   Physical Exam    VS:  BP 130/76 (BP Location: Left Arm, Patient Position: Sitting, Cuff Size: Large)   Pulse (!) 58   Ht 5\' 9"  (1.753 m)   Wt 219 lb (99.3 kg)   SpO2 97%   BMI 32.34 kg/m   GEN: Well nourished, well developed, in no acute distress. HEENT: normal. Neck: Supple, no JVD, carotid bruits, or masses. Cardiac: RRR, no murmurs, rubs, or gallops. No clubbing, cyanosis, edema.  Radials/DP/PT 2+ and equal bilaterally.  Respiratory:  Respirations regular and unlabored, clear to auscultation bilaterally. GI: Soft, nontender, nondistended, BS + x 4. MS: no deformity or atrophy. Skin: warm and dry, no rash. Neuro:  Strength and sensation are intact. Psych: Normal affect.  Accessory Clinical Findings    ECG personally reviewed by me today -    - no EKG in office today.    Lab Results  Component Value Date   WBC 6.6 06/02/2023   HGB 13.4 06/02/2023   HCT 42.4 06/02/2023   MCV 82.2 06/02/2023   PLT 206 06/02/2023   Lab Results  Component Value Date   CREATININE 1.06 06/02/2023   BUN 12 06/02/2023   NA 139 06/02/2023   K 4.0  06/02/2023   CL 109 06/02/2023   CO2 20 (L) 06/02/2023   Lab Results  Component Value Date   ALT 24 06/02/2023   AST 25 06/02/2023   ALKPHOS 36 (L) 06/02/2023   BILITOT 0.8 06/02/2023   Lab Results  Component Value Date   CHOL 106 10/04/2021   HDL 40 10/04/2021   LDLCALC 45 10/04/2021   TRIG 115 10/04/2021   CHOLHDL 2.7 10/04/2021    No results found for: "HGBA1C"  Assessment & Plan   1. CAD: Cardiac catheterization in December 2022 revealed 58% mid LAD stenosis, 60% distal LAD stenosis, mild  diffuse disease throughout the RCA 90% stenosis of the first posterolateral branch.  Medical therapy was recommended.  It was noted that  should he develop symptoms, PCI of LAD could be performed.  He notes a several month history of increased dyspnea on exertion, generalized fatigue, decreased activity tolerance.  He denies chest pain.  He question whether his symptoms could be coming from Repatha, he stopped taking the medication on his own, he has not noticed much improvement in his symptoms.  He is concerned that his symptoms could represent possible anginal equivalent. Discussed symptoms with Dr. Tresa Endo, DOD.  Through shared decision making, will pursue cardiac catheterization.  Discussed possible antianginal therapy with long-acting nitrates, patient declines. Cardiac catheterization scheduled for 08/31/2023 with Dr. Excell Seltzer.  He will return for EKG, CBC, BMET prior to procedure.  Continue aspirin, amlodipine, bisoprolol/hydrochlorothiazide.  Informed Consent   Shared Decision Making/Informed Consent The risks [stroke (1 in 1000), death (1 in 1000), kidney failure [usually temporary] (1 in 500), bleeding (1 in 200), allergic reaction [possibly serious] (1 in 200)], benefits (diagnostic support and management of coronary artery disease) and alternatives of a cardiac catheterization were discussed in detail with Mr. Lorona and he is willing to proceed.    2. Hypertension: BP well controlled.  Continue current antihypertensive regimen.   3. Hyperlipidemia: LDL was 18 in 05/2023.  Patient has since stopped taking his Repatha due to concern for side effects.  We discussed referral to lipid clinic Pharm.D. to discuss alternative lipid-lowering therapy.  He notes he will not take a statin, he is not interested in a PCSK9 inhibitor.  He prefers to defer follow-up with pharmD at this time.    4. Disposition:  Follow-up as scheduled for 09/22/2023 with Dr. Jens Som.        Joylene Grapes, NP 08/18/2023, 2:15 PM

## 2023-08-18 NOTE — Telephone Encounter (Signed)
Ruben Rodriguez, pt was seen in office today. When pt comes in for lab work prior to his heart cath, an EKG is needed. Waiting on a return call to discuss with pt.

## 2023-08-18 NOTE — Patient Instructions (Signed)
Medication Instructions:  Your physician recommends that you continue on your current medications as directed. Please refer to the Current Medication list given to you today.  *If you need a refill on your cardiac medications before your next appointment, please call your pharmacy*   Lab Work: BMET, CBC today.   Testing/Procedures: Your physician has requested that you have a cardiac catheterization. Cardiac catheterization is used to diagnose and/or treat various heart conditions. Doctors may recommend this procedure for a number of different reasons. The most common reason is to evaluate chest pain. Chest pain can be a symptom of coronary artery disease (CAD), and cardiac catheterization can show whether plaque is narrowing or blocking your heart's arteries. This procedure is also used to evaluate the valves, as well as measure the blood flow and oxygen levels in different parts of your heart. For further information please visit [email or web address] Please follow instruction sheet, as given.    Follow-Up: At [facility], you and your health needs are our priority.  As part of our continuing mission to provide you with exceptional heart care, we have created designated Provider Care Teams.  These Care Teams include your primary Cardiologist (physician) and Advanced Practice Providers (APPs -  Physician Assistants and Nurse Practitioners) who all work together to provide you with the care you need, when you need it.  We recommend signing up for the patient portal called "MyChart".  Sign up information is provided on this After Visit Summary.  MyChart is used to connect with patients for Virtual Visits (Telemedicine).  Patients are able to view lab/test results, encounter notes, upcoming appointments, etc.  Non-urgent messages can be sent to your provider as well.   To learn more about what you can do with MyChart, go to [email or web address].    Your next appointment:    Keep follow  up   Provider:   Olga Millers, MD MD only     Other Instructions  [facility] A DEPT OF [place]. [facility] AT [facility] [address] [place] [address] Dept: [phone] Loc: [phone]  Turan Quintiliani [facility]  08/18/2023  You are scheduled for a Cardiac Catheterization on Thursday, December 5 with Dr. Tonny Bollman.  1. Please arrive at the [facility] (Main Entrance A) at [facility]: [address] [place], [place] [address] at 12:00 PM (This time is 2 hour(s) before your procedure to ensure your preparation).   Free valet parking service is available. You will check in at ADMITTING. The support person will be asked to wait in the waiting room.  It is OK to have someone drop you off and come back when you are ready to be discharged.    Special note: Every effort is made to have your procedure done on time. Please understand that emergencies sometimes delay scheduled procedures.  2. Diet: Do not eat solid foods after midnight.  The patient may have clear liquids until 5am upon the day of the procedure.  3. Labs: You will need to have blood drawn 1 week prior  4. Medication instructions in preparation for your procedure:   Contrast Allergy: No    Current Outpatient Medications (Cardiovascular):    amLODipine (NORVASC) 10 MG tablet, Take 10 mg by mouth daily.   bisoprolol-hydrochlorothiazide (ZIAC) 10-6.25 MG per tablet, Take 1 tablet by mouth daily.   Evolocumab (REPATHA SURECLICK) 140 MG/ML SOAJ, Inject 140 mg into the skin every 14 (fourteen) days. (Patient not taking:  Reported on 08/18/2023)   Current Outpatient Medications (Analgesics):    acetaminophen (TYLENOL) 650 MG CR tablet, Take 1,300 mg by mouth every 8 (eight) hours as needed for pain.   aspirin EC 81 MG tablet, Take 81 mg by mouth daily. Swallow whole.   Current Outpatient Medications (Other):    finasteride (PROSCAR) 5 MG tablet,  Take 5 mg by mouth at bedtime.   pantoprazole (PROTONIX) 40 MG tablet, Take 1 tablet (40 mg total) by mouth 2 (two) times daily.   Polyethyl Glycol-Propyl Glycol (LUBRICANT EYE DROPS) 0.4-0.3 % SOLN, Place 1-2 drops into both eyes 3 (three) times daily as needed (dry/irritated eyes.).   ciprofloxacin (CIPRO) 500 MG tablet, Take 1 tablet (500 mg total) by mouth 2 (two) times daily. (Patient not taking: Reported on 08/18/2023)   metroNIDAZOLE (FLAGYL) 500 MG tablet, Take 1 tablet (500 mg total) by mouth every 12 (twelve) hours. (Patient not taking: Reported on 08/18/2023) *For reference purposes while preparing patient instructions.   Delete this med list prior to printing instructions for patient.*    On the morning of your procedure, take your Aspirin 81 mg and any morning medicines NOT listed above.  You may use sips of water.  5. Plan to go home the same day, you will only stay overnight if medically necessary. 6. Bring a current list of your medications and current insurance cards. 7. You MUST have a responsible person to drive you home. 8. Someone MUST be with you the first 24 hours after you arrive home or your discharge will be delayed. 9. Please wear clothes that are easy to get on and off and wear slip-on shoes.  Thank you for allowing [place] to care for you!   -- [place] [facility]

## 2023-08-18 NOTE — Progress Notes (Signed)
Office Visit    Patient Name: Ruben Rodriguez Date of Encounter: 08/18/2023  Primary Care Provider:  Lupita Raider, MD Primary Cardiologist:  Olga Millers, MD  Chief Complaint    77 year old male with a history of CAD, hypertension, and hyperlipidemia who presents for follow-up related to CAD.  Past Medical History    Past Medical History:  Diagnosis Date   Arthritis    BPH (benign prostatic hyperplasia)    Chronic back pain    Family history of adverse reaction to anesthesia    father died during carotid artery surgery   GERD (gastroesophageal reflux disease)    Hyperlipidemia    Hypertension    Pneumonia    Spinal stenosis    Past Surgical History:  Procedure Laterality Date   BIOPSY  05/31/2023   Procedure: BIOPSY;  Surgeon: Charlott Rakes, MD;  Location: [facility] ENDOSCOPY;  Service: Gastroenterology;;   COLONOSCOPY     ESOPHAGOGASTRODUODENOSCOPY (EGD) WITH PROPOFOL N/A 05/31/2023   Procedure: ESOPHAGOGASTRODUODENOSCOPY (EGD) WITH PROPOFOL;  Surgeon: Charlott Rakes, MD;  Location: [facility] ENDOSCOPY;  Service: Gastroenterology;  Laterality: N/A;   FINGER SURGERY     to remove a tumor from right little finger   LAMINECTOMY WITH POSTERIOR LATERAL ARTHRODESIS LEVEL 2 N/A 08/01/2018   Procedure: LUMBAR THREE- LUMBAR FOUR, LUMBAR FOUR-LUMBAR FIVE DECOMPRESSION, LUMBAR THREE-LUMBAR FOUR, LUMBAR FOUR-LUMBAR FIVE POSTEROLATERAL ARTHRODESIS;  Surgeon: Shirlean Kelly, MD;  Location: [facility] OR;  Service: Neurosurgery;  Laterality: N/A;   LEFT HEART CATH AND CORONARY ANGIOGRAPHY N/A 08/27/2021   Procedure: LEFT HEART CATH AND CORONARY ANGIOGRAPHY;  Surgeon: Tonny Bollman, MD;  Location: [facility] INVASIVE CV LAB;  Service: Cardiovascular;  Laterality: N/A;    Allergies  Allergies  Allergen Reactions   Penicillins Anaphylaxis   Atorvastatin Other (See Comments)    Muscle aches   Ezetimibe Other (See Comments)    Muscle aches   Zocor [Simvastatin] Other (See Comments)    Muscle aches    Amlodipine Swelling and Other (See Comments)    Takes amlodipine at home 05/30/23   Amoxicillin Other (See Comments)    MD advised patient to not take this, either   Crestor [Rosuvastatin Calcium] Other (See Comments)    Joint and muscle pain   Other Rash and Other (See Comments)    FISH- No grouper or trout      Labs/Other Studies Reviewed    The following studies were reviewed today:  Cardiac Studies & Procedures   CARDIAC CATHETERIZATION  CARDIAC CATHETERIZATION 08/27/2021  Narrative   1st RPL lesion is 90% stenosed.   Mid LAD-1 lesion is 50% stenosed.   Mid LAD-2 lesion is 80% stenosed.   Dist LAD lesion is 60% stenosed.  1.  Patent left main 2.  Severe mid LAD stenosis segmentally through the mid and distal vessel, but widely patent proximal LAD and large first diagonal branches noted. 3.  Patent left circumflex with mild luminal irregularity 4.  Patent RCA with mild diffuse plaquing and severe stenosis of the first posterolateral branch 5.  Low LVEDP  Favor medical therapy: Patient would require long segment stenting of multiple lesions in the mid and distal LAD in order to treat that segment of disease that is hemodynamically significant by CT-FFR.  The distal vessel is small in caliber.  The patient is having no angina.  I think it is most prudent to treat him medically.  If he has any high risk features over time on surveillance treadmill testing or if he develops angina, PCI  is technically feasible.  Findings Coronary Findings Diagnostic  Dominance: Right  Left Main The vessel exhibits minimal luminal irregularities.  Left Anterior Descending Mid LAD-1 lesion is 50% stenosed. Mid LAD-2 lesion is 80% stenosed. Dist LAD lesion is 60% stenosed.  Left Circumflex The vessel exhibits minimal luminal irregularities.  Right Coronary Artery Vessel is large. There is mild diffuse disease throughout the vessel. Large, dominant vessel with diffuse plaquing present.   There are no high-grade stenoses throughout the proximal, mid, or distal vessel.  The vessel supplies a PDA and a PLA branch.  The PDA is patent with no significant stenosis.  The first posterolateral branch has a 90% stenosis.  First Right Posterolateral Branch 1st RPL lesion is 90% stenosed.  Intervention  No interventions have been documented.        CT SCANS  CT CORONARY MORPH W/CTA COR W/SCORE 08/16/2021  Addendum 08/16/2021 11:36 AM ADDENDUM REPORT: 08/16/2021 11:33  CLINICAL DATA:  Chest pain  EXAM: Cardiac/Coronary CTA  TECHNIQUE: A non-contrast, gated CT scan was obtained with axial slices of 3 mm through the heart for calcium scoring. Calcium scoring was performed using the Agatston method. A 100 kV prospective, gated, contrast cardiac scan was obtained. Gantry rotation speed was 250 msecs and collimation was 0.6 mm. Two sublingual nitroglycerin tablets (0.8 mg) were given. The 3D data set was reconstructed in 5% intervals of the 35-75% of the R-R cycle. Diastolic phases were analyzed on a dedicated workstation using MPR, MIP, and VRT modes. The patient received 95 cc of contrast.  FINDINGS: Image quality: Excellent.  Noise artifact is: Limited.  Coronary Arteries:  Normal coronary origin.  Right dominance.  Left main: The left main is a large caliber vessel with a normal take off from the left coronary cusp that bifurcates to form a left anterior descending artery and a left circumflex artery. There is minimal calcified plaque (<25%).  Left anterior descending artery: The proximal LAD contains minimal calcified plaque (<25%). The mid LAD contains a severe mixed density plaque (70-99%). The distal LAD appears patent. The LAD gives off 1 diagonal branch with minimal calcified plaque (<25%).  Left circumflex artery: The LCX is non-dominant. There is no plaque or stenosis. OM1 contains minimal calcified plaque (<25%).  Right coronary artery: The RCA is  dominant with normal take off from the right coronary cusp. The proximal RCA contains minimal calcified plaque (<25%). The mid RCA contains mild to moderate mixed density plaque (40-50%). The distal RCA contains minimal mixed density plaque (<25%). The PDA contains mild calcified plaque (25-49%). The PLV contains moderate non-calcified plaque (50-69%).  Right Atrium: Right atrial size is within normal limits.  Right Ventricle: The right ventricular cavity is within normal limits.  Left Atrium: Left atrial size is normal in size with no left atrial appendage filling defect. A small PFO is present.  Left Ventricle: The ventricular cavity size is within normal limits. There are no stigmata of prior infarction. There is no abnormal filling defect.  Pulmonary arteries: Normal in size without proximal filling defect.  Pulmonary veins: Normal pulmonary venous drainage.  Pericardium: Normal thickness with no significant effusion or calcium present.  Cardiac valves: The aortic valve is trileaflet without significant calcification. The mitral valve is normal structure without significant calcification.  Aorta: Normal caliber with no significant disease.  Extra-cardiac findings: See attached radiology report for non-cardiac structures.  IMPRESSION: 1. Coronary calcium score of 429. This was 30 percentile for age-, sex, and race-matched controls.  2. Normal  coronary origin with right dominance.  3. Severe mixed density stenosis in the mid LAD (70-99%).  4. Minimal calcified plaque (<25%) in the LCX.  5. Mild to moderate mid RCA mixed density plaque (40-50%).  6. Mild calcified plaque in the PDA (25-49%).  7. Moderate non-calcified plaque (50-69%) in the PLV.  RECOMMENDATIONS: 1. Severe stenosis. CT FFR will be submitted for the RCA. Consider symptom-guided anti-ischemic pharmacotherapy as well as risk factor modification per guideline directed care. Invasive  coronary angiography recommended with revascularization per published guideline statements.  Lennie Odor, MD   Electronically Signed By: Lennie Odor M.D. On: 08/16/2021 11:33  Narrative EXAM: OVER-READ INTERPRETATION  CT CHEST  The following report is an over-read performed by radiologist Dr. Charlett Nose of [facility] on 08/16/2021. This over-read does not include interpretation of cardiac or coronary anatomy or pathology. The coronary CTA interpretation by the cardiologist is attached.  COMPARISON:  06/16/2021  FINDINGS: Vascular: Heart is normal size.  Aorta normal caliber.  Mediastinum/Nodes: No adenopathy  Lungs/Pleura: No confluent opacities or effusions.  Upper Abdomen: No acute findings  Musculoskeletal: Chest wall soft tissues are unremarkable. No acute bony abnormality.  IMPRESSION: No acute or significant extracardiac abnormality.  Electronically Signed: By: Charlett Nose M.D. On: 08/16/2021 09:11         Recent Labs: 06/02/2023: ALT 24; BUN 12; Creatinine, Ser 1.06; Hemoglobin 13.4; Platelets 206; Potassium 4.0; Sodium 139  Recent Lipid Panel    Component Value Date/Time   CHOL 106 10/04/2021 0942   TRIG 115 10/04/2021 0942   HDL 40 10/04/2021 0942   CHOLHDL 2.7 10/04/2021 0942   LDLCALC 45 10/04/2021 0942    History of Present Illness    77 year old male with the above past medical history including CAD, hypertension, and hyperlipidemia.  Renal Dopplers in 2018 showed evidence of renal artery stent.  Coronary score in September 2022 was 561 (60th percentile).  There was also evidence of aortic atherosclerosis.  Coronary CT angiogram in 08/15/2021 revealed coronary calcium score 429 with severe stenosis in the mid LAD, mild to moderate mid coronary artery disease (40 to 50%), moderate disease in the posterior lateral (50 to 69%), LAD lesion significant by FFR.  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  Cardiac  catheterization in December 2022 revealed 58% mid LAD stenosis, 60% distal LAD stenosis, mild diffuse disease throughout the RCA 90% stenosis of the first posterolateral branch.  Medical therapy was recommended.  It was noted that  should he develop symptoms, PCI of LAD could be performed.  Carotid ultrasound in September 2023 showed no significant stenosis of the internal arteries. 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  He was last seen in the office on 08/17/2022 and was stable from a cardiac standpoint.  He denied symptoms concerning for angina. He was hospitalized in September 2024 in the setting of duodenal ulcers. He was started on pantoprazole.   He presents today for follow-up accompanied by his wife.  Since his last visit he has been stable from a cardiac standpoint.  He does note increased dyspnea on exertion, generalized fatigue, decreased activity tolerance.  He was concerned that his symptoms were related to Repatha.  He stopped taking the medication though he has noticed not much improvement in his symptoms.  He is not interested in restarting PCSK9 inhibitor at this point in time. He is concerned that his recent symptoms could also represent possible anginal equivalent.  Overall, he feels  poorly.  Home Medications    Current Outpatient Medications  Medication Sig Dispense Refill   acetaminophen (TYLENOL) 650 MG CR tablet Take 1,300 mg by mouth every 8 (eight) hours as needed for pain.     amLODipine (NORVASC) 10 MG tablet Take 10 mg by mouth daily.     aspirin EC 81 MG tablet Take 81 mg by mouth daily. Swallow whole.     bisoprolol-hydrochlorothiazide (ZIAC) 10-6.25 MG per tablet Take 1 tablet by mouth daily.     finasteride (PROSCAR) 5 MG tablet Take 5 mg by mouth at bedtime.     pantoprazole (PROTONIX) 40 MG tablet Take 1 tablet (40 mg total) by mouth 2 (two) times daily. 240 tablet 0   Polyethyl Glycol-Propyl Glycol (LUBRICANT EYE  DROPS) 0.4-0.3 % SOLN Place 1-2 drops into both eyes 3 (three) times daily as needed (dry/irritated eyes.).     ciprofloxacin (CIPRO) 500 MG tablet Take 1 tablet (500 mg total) by mouth 2 (two) times daily. (Patient not taking: Reported on 08/18/2023) 6 tablet 0   Evolocumab (REPATHA SURECLICK) 140 MG/ML SOAJ Inject 140 mg into the skin every 14 (fourteen) days. (Patient not taking: Reported on 08/18/2023) 2 mL 5   metroNIDAZOLE (FLAGYL) 500 MG tablet Take 1 tablet (500 mg total) by mouth every 12 (twelve) hours. (Patient not taking: Reported on 08/18/2023) 6 tablet 0   No current facility-administered medications for this visit.     Review of Systems    He denies chest pain, palpitations, pnd, orthopnea, n, v, dizziness, syncope, edema, weight gain, or early satiety. All other systems reviewed and are otherwise negative except as noted above.   Physical Exam    VS:  BP 130/76 (BP Location: Left Arm, Patient Position: Sitting, Cuff Size: Large)   Pulse (!) 58   Ht 5\' 9"  (1.753 m)   Wt 219 lb (99.3 kg)   SpO2 97%   BMI 32.34 kg/m   GEN: Well nourished, well developed, in no acute distress. HEENT: normal. Neck: Supple, no JVD, carotid bruits, or masses. Cardiac: RRR, no murmurs, rubs, or gallops. No clubbing, cyanosis, edema.  Radials/DP/PT 2+ and equal bilaterally.  Respiratory:  Respirations regular and unlabored, clear to auscultation bilaterally. GI: Soft, nontender, nondistended, BS + x 4. MS: no deformity or atrophy. Skin: warm and dry, no rash. Neuro:  Strength and sensation are intact. Psych: Normal affect.  Accessory Clinical Findings    ECG personally reviewed by me today -    - no EKG in office today.    Lab Results  Component Value Date   WBC 6.6 06/02/2023   HGB 13.4 06/02/2023   HCT 42.4 06/02/2023   MCV 82.2 06/02/2023   PLT 206 06/02/2023   Lab Results  Component Value Date   CREATININE 1.06 06/02/2023   BUN 12 06/02/2023   NA 139 06/02/2023   K 4.0  06/02/2023   CL 109 06/02/2023   CO2 20 (L) 06/02/2023   Lab Results  Component Value Date   ALT 24 06/02/2023   AST 25 06/02/2023   ALKPHOS 36 (L) 06/02/2023   BILITOT 0.8 06/02/2023   Lab Results  Component Value Date   CHOL 106 10/04/2021   HDL 40 10/04/2021   LDLCALC 45 10/04/2021   TRIG 115 10/04/2021   CHOLHDL 2.7 10/04/2021    No results found for: "HGBA1C"  Assessment & Plan   1. CAD: Cardiac catheterization in December 2022 revealed 58% mid LAD stenosis, 60% distal LAD stenosis, mild  diffuse disease throughout the RCA 90% stenosis of the first posterolateral branch.  Medical therapy was recommended.  It was noted that  should he develop symptoms, PCI of LAD could be performed.  He notes a several month history of increased dyspnea on exertion, generalized fatigue, decreased activity tolerance.  He denies chest pain.  He question whether his symptoms could be coming from Repatha, he stopped taking the medication on his own, he has not noticed much improvement in his symptoms.  He is concerned that his symptoms could represent possible anginal equivalent. Discussed symptoms with Dr. Tresa Endo, DOD.  Through shared decision making, will pursue cardiac catheterization.  Discussed possible antianginal therapy with long-acting nitrates, patient declines. Cardiac catheterization scheduled for 08/31/2023 with Dr. Excell Seltzer.  He will return for EKG, CBC, BMET prior to procedure.  Continue aspirin, amlodipine, bisoprolol/hydrochlorothiazide.  Informed Consent   Shared Decision Making/Informed Consent The risks [stroke (1 in 1000), death (1 in 1000), kidney failure [usually temporary] (1 in 500), bleeding (1 in 200), allergic reaction [possibly serious] (1 in 200)], benefits (diagnostic support and management of coronary artery disease) and alternatives of a cardiac catheterization were discussed in detail with Mr. Lorona and he is willing to proceed.    2. Hypertension: BP well controlled.  Continue current antihypertensive regimen.   3. Hyperlipidemia: LDL was 18 in 05/2023.  Patient has since stopped taking his Repatha due to concern for side effects.  We discussed referral to lipid clinic Pharm.D. to discuss alternative lipid-lowering therapy.  He notes he will not take a statin, he is not interested in a PCSK9 inhibitor.  He prefers to defer follow-up with pharmD at this time.    4. Disposition:  Follow-up as scheduled for 09/22/2023 with Dr. Jens Som.        Joylene Grapes, NP 08/18/2023, 2:15 PM

## 2023-08-21 ENCOUNTER — Ambulatory Visit: Payer: Medicare HMO | Attending: Cardiology | Admitting: Nurse Practitioner

## 2023-08-21 VITALS — Ht 69.0 in | Wt 219.0 lb

## 2023-08-21 DIAGNOSIS — I2510 Atherosclerotic heart disease of native coronary artery without angina pectoris: Secondary | ICD-10-CM | POA: Diagnosis not present

## 2023-08-21 DIAGNOSIS — E785 Hyperlipidemia, unspecified: Secondary | ICD-10-CM | POA: Diagnosis not present

## 2023-08-21 DIAGNOSIS — R0609 Other forms of dyspnea: Secondary | ICD-10-CM

## 2023-08-21 DIAGNOSIS — I10 Essential (primary) hypertension: Secondary | ICD-10-CM | POA: Diagnosis not present

## 2023-08-22 LAB — BASIC METABOLIC PANEL
BUN/Creatinine Ratio: 16 (ref 10–24)
BUN: 21 mg/dL (ref 8–27)
CO2: 24 mmol/L (ref 20–29)
Calcium: 9.5 mg/dL (ref 8.6–10.2)
Chloride: 106 mmol/L (ref 96–106)
Creatinine, Ser: 1.3 mg/dL — ABNORMAL HIGH (ref 0.76–1.27)
Glucose: 103 mg/dL — ABNORMAL HIGH (ref 70–99)
Potassium: 4.6 mmol/L (ref 3.5–5.2)
Sodium: 143 mmol/L (ref 134–144)
eGFR: 57 mL/min/{1.73_m2} — ABNORMAL LOW (ref >59)

## 2023-08-22 LAB — CBC
Hematocrit: 44.7 % (ref 37.5–51.0)
Hemoglobin: 14.2 g/dL (ref 13.0–17.7)
MCH: 26.2 pg — ABNORMAL LOW (ref 26.6–33.0)
MCHC: 31.8 g/dL (ref 31.5–35.7)
MCV: 83 fL (ref 79–97)
Platelets: 196 10*3/uL (ref 150–450)
RBC: 5.42 x10E6/uL (ref 4.14–5.80)
RDW: 13.5 % (ref 11.6–15.4)
WBC: 7.7 10*3/uL (ref 3.4–10.8)

## 2023-08-30 ENCOUNTER — Telehealth: Payer: Self-pay | Admitting: *Deleted

## 2023-08-30 NOTE — Telephone Encounter (Signed)
Cardiac Catheterization scheduled at [facility] for: Thursday August 31, 2023 2 PM Arrival time [facility] Main Entrance A at: 33 Noon  Nothing to eat after midnight prior to procedure, clear liquids until 5 AM day of procedure.  Medication instructions: -Hold:  Bisoprolol-HCT-day before and day of procedure- per protocol GFR <60 (57)-pt already taken today -Other usual morning medications can be taken with sips of water including aspirin 81 mg.  Plan to go home the same day, you will only stay overnight if medically necessary.  You must have responsible adult to drive you home.  Someone must be with you the first 24 hours after you arrive home.  Reviewed procedure instructions with patient.

## 2023-08-31 ENCOUNTER — Other Ambulatory Visit: Payer: Self-pay

## 2023-08-31 ENCOUNTER — Encounter (HOSPITAL_COMMUNITY)
Admission: RE | Disposition: A | Discharge status: Home / Self Care | Payer: Self-pay | Source: Home / Self Care | Attending: Cardiovascular Disease

## 2023-08-31 ENCOUNTER — Ambulatory Visit (HOSPITAL_COMMUNITY)
Admission: RE | Admit: 2023-08-31 | Discharge: 2023-08-31 | Disposition: A | Discharge status: Home / Self Care | Payer: Medicare HMO | Attending: Cardiovascular Disease | Admitting: Cardiovascular Disease

## 2023-08-31 ENCOUNTER — Other Ambulatory Visit (HOSPITAL_COMMUNITY): Payer: Self-pay

## 2023-08-31 DIAGNOSIS — I25110 Atherosclerotic heart disease of native coronary artery with unstable angina pectoris: Secondary | ICD-10-CM | POA: Diagnosis not present

## 2023-08-31 DIAGNOSIS — I2510 Atherosclerotic heart disease of native coronary artery without angina pectoris: Secondary | ICD-10-CM | POA: Diagnosis present

## 2023-08-31 DIAGNOSIS — E785 Hyperlipidemia, unspecified: Secondary | ICD-10-CM | POA: Insufficient documentation

## 2023-08-31 DIAGNOSIS — R5383 Other fatigue: Secondary | ICD-10-CM

## 2023-08-31 DIAGNOSIS — I700 Atherosclerosis of aorta: Secondary | ICD-10-CM | POA: Insufficient documentation

## 2023-08-31 DIAGNOSIS — Z7902 Long term (current) use of antithrombotics/antiplatelets: Secondary | ICD-10-CM | POA: Diagnosis not present

## 2023-08-31 DIAGNOSIS — I25119 Atherosclerotic heart disease of native coronary artery with unspecified angina pectoris: Secondary | ICD-10-CM

## 2023-08-31 DIAGNOSIS — R0609 Other forms of dyspnea: Secondary | ICD-10-CM | POA: Diagnosis not present

## 2023-08-31 DIAGNOSIS — I10 Essential (primary) hypertension: Secondary | ICD-10-CM | POA: Insufficient documentation

## 2023-08-31 DIAGNOSIS — I25118 Atherosclerotic heart disease of native coronary artery with other forms of angina pectoris: Secondary | ICD-10-CM

## 2023-08-31 DIAGNOSIS — Z7982 Long term (current) use of aspirin: Secondary | ICD-10-CM | POA: Diagnosis not present

## 2023-08-31 DIAGNOSIS — Z79899 Other long term (current) drug therapy: Secondary | ICD-10-CM | POA: Diagnosis not present

## 2023-08-31 HISTORY — PX: CORONARY STENT INTERVENTION: CATH118234

## 2023-08-31 HISTORY — PX: LEFT HEART CATH AND CORONARY ANGIOGRAPHY: CATH118249

## 2023-08-31 HISTORY — PX: CORONARY PRESSURE/FFR WITH 3D MAPPING: CATH118309

## 2023-08-31 LAB — POCT ACTIVATED CLOTTING TIME: Activated Clotting Time: 302 s

## 2023-08-31 SURGERY — LEFT HEART CATH AND CORONARY ANGIOGRAPHY
Anesthesia: LOCAL

## 2023-08-31 MED ORDER — VERAPAMIL HCL 2.5 MG/ML IV SOLN
INTRAVENOUS | Status: DC | PRN
  Administered 2023-08-31: 10 mL via INTRA_ARTERIAL

## 2023-08-31 MED ORDER — MIDAZOLAM HCL 2 MG/2ML IJ SOLN
INTRAMUSCULAR | Status: AC
  Filled 2023-08-31: qty 2

## 2023-08-31 MED ORDER — ACETAMINOPHEN 325 MG PO TABS
650.0000 mg | ORAL_TABLET | ORAL | Status: DC | PRN
Start: 2023-08-31 — End: 2023-09-01

## 2023-08-31 MED ORDER — HYDRALAZINE HCL 20 MG/ML IJ SOLN: 10.0000 mg | INTRAMUSCULAR | Status: DC | PRN

## 2023-08-31 MED ORDER — IOHEXOL 350 MG/ML SOLN
INTRAVENOUS | Status: DC | PRN
  Administered 2023-08-31: 130 mL

## 2023-08-31 MED ORDER — SODIUM CHLORIDE 0.9% FLUSH
3.0000 mL | Freq: Two times a day (BID) | INTRAVENOUS | Status: DC
Start: 2023-09-01 — End: 2023-09-01

## 2023-08-31 MED ORDER — SODIUM CHLORIDE 0.9 % WEIGHT BASED INFUSION: 1.0000 mL/kg/h | INTRAVENOUS | Status: DC

## 2023-08-31 MED ORDER — FENTANYL CITRATE (PF) 100 MCG/2ML IJ SOLN
INTRAMUSCULAR | Status: DC | PRN
  Administered 2023-08-31 (×2): 25 ug via INTRAVENOUS

## 2023-08-31 MED ORDER — LIDOCAINE HCL (PF) 1 % IJ SOLN
INTRAMUSCULAR | Status: AC
  Filled 2023-08-31: qty 30

## 2023-08-31 MED ORDER — FAMOTIDINE IN NACL 20-0.9 MG/50ML-% IV SOLN
INTRAVENOUS | Status: AC
  Filled 2023-08-31: qty 50

## 2023-08-31 MED ORDER — ONDANSETRON HCL 4 MG/2ML IJ SOLN: 4.0000 mg | Freq: Four times a day (QID) | INTRAMUSCULAR | Status: DC | PRN

## 2023-08-31 MED ORDER — HEPARIN (PORCINE) IN NACL 1000-0.9 UT/500ML-% IV SOLN
INTRAVENOUS | Status: DC | PRN
  Administered 2023-08-31 (×2): 500 mL

## 2023-08-31 MED ORDER — CLOPIDOGREL BISULFATE 300 MG PO TABS
ORAL_TABLET | ORAL | Status: AC
  Filled 2023-08-31: qty 2

## 2023-08-31 MED ORDER — NITROGLYCERIN 1 MG/10 ML FOR IR/CATH LAB
INTRA_ARTERIAL | Status: DC | PRN
  Administered 2023-08-31 (×2): 150 ug

## 2023-08-31 MED ORDER — FENTANYL CITRATE (PF) 100 MCG/2ML IJ SOLN
INTRAMUSCULAR | Status: AC
  Filled 2023-08-31: qty 2

## 2023-08-31 MED ORDER — SODIUM CHLORIDE 0.9 % IV SOLN
250.0000 mL | INTRAVENOUS | Status: DC | PRN
Start: 2023-09-01 — End: 2023-09-01

## 2023-08-31 MED ORDER — VERAPAMIL HCL 2.5 MG/ML IV SOLN
INTRAVENOUS | Status: AC
  Filled 2023-08-31: qty 2

## 2023-08-31 MED ORDER — ASPIRIN 81 MG PO CHEW: 81.0000 mg | CHEWABLE_TABLET | ORAL | Status: DC

## 2023-08-31 MED ORDER — FAMOTIDINE IN NACL 20-0.9 MG/50ML-% IV SOLN
INTRAVENOUS | Status: DC | PRN
  Administered 2023-08-31: 20 mg via INTRAVENOUS

## 2023-08-31 MED ORDER — NITROGLYCERIN 0.4 MG SL SUBL: 0.4000 mg | SUBLINGUAL_TABLET | SUBLINGUAL | 2 refills | Status: AC | PRN

## 2023-08-31 MED ORDER — LABETALOL HCL 5 MG/ML IV SOLN: 10.0000 mg | INTRAVENOUS | Status: DC | PRN

## 2023-08-31 MED ORDER — SODIUM CHLORIDE 0.9 % WEIGHT BASED INFUSION
3.0000 mL/kg/h | INTRAVENOUS | Status: AC
  Administered 2023-08-31: 3 mL/kg/h via INTRAVENOUS

## 2023-08-31 MED ORDER — NITROGLYCERIN 1 MG/10 ML FOR IR/CATH LAB
INTRA_ARTERIAL | Status: AC
  Filled 2023-08-31: qty 10

## 2023-08-31 MED ORDER — LIDOCAINE HCL (PF) 1 % IJ SOLN
INTRAMUSCULAR | Status: DC | PRN
  Administered 2023-08-31: 2 mL

## 2023-08-31 MED ORDER — CLOPIDOGREL BISULFATE 300 MG PO TABS
ORAL_TABLET | ORAL | Status: DC | PRN
  Administered 2023-08-31: 600 mg via ORAL

## 2023-08-31 MED ORDER — SODIUM CHLORIDE 0.9% FLUSH: 3.0000 mL | INTRAVENOUS | Status: DC | PRN

## 2023-08-31 MED ORDER — CLOPIDOGREL BISULFATE 75 MG PO TABS
75.0000 mg | ORAL_TABLET | Freq: Every day | ORAL | 0 refills | Status: DC
  Filled 2023-08-31: qty 30, 30d supply, fill #0

## 2023-08-31 MED ORDER — HEPARIN SODIUM (PORCINE) 1000 UNIT/ML IJ SOLN
INTRAMUSCULAR | Status: DC | PRN
  Administered 2023-08-31 (×2): 5000 [IU] via INTRAVENOUS

## 2023-08-31 MED ORDER — CLOPIDOGREL BISULFATE 75 MG PO TABS
75.0000 mg | ORAL_TABLET | Freq: Every day | ORAL | Status: DC
Start: 2023-09-01 — End: 2023-09-01

## 2023-08-31 MED ORDER — HEPARIN SODIUM (PORCINE) 1000 UNIT/ML IJ SOLN
INTRAMUSCULAR | Status: AC
  Filled 2023-08-31: qty 10

## 2023-08-31 MED ORDER — MIDAZOLAM HCL 2 MG/2ML IJ SOLN
INTRAMUSCULAR | Status: DC | PRN
  Administered 2023-08-31 (×2): 2 mg via INTRAVENOUS

## 2023-08-31 SURGICAL SUPPLY — 19 items
BALL SAPPHIRE NC24 2.75X12 (BALLOONS) ×1
BALLN EMERGE MR 2.0X12 (BALLOONS) ×1
BALLOON EMERGE MR 2.0X12 (BALLOONS) IMPLANT
BALLOON SAPPHIRE NC24 2.75X12 (BALLOONS) IMPLANT
CARD KEY FFR CATHWORX (MISCELLANEOUS) IMPLANT
CATH 5FR JL3.5 JR4 ANG PIG MP (CATHETERS) IMPLANT
CATH LAUNCHER 6FR EBU3.5 (CATHETERS) IMPLANT
DEVICE RAD COMP TR BAND LRG (VASCULAR PRODUCTS) IMPLANT
FFR CATHWORX KEY CARD (MISCELLANEOUS) ×1
GLIDESHEATH SLEND SS 6F .021 (SHEATH) IMPLANT
GUIDEWIRE INQWIRE 1.5J.035X260 (WIRE) IMPLANT
INQWIRE 1.5J .035X260CM (WIRE) ×1
KIT ENCORE 26 ADVANTAGE (KITS) IMPLANT
KIT HEMO VALVE WATCHDOG (MISCELLANEOUS) IMPLANT
PACK CARDIAC CATHETERIZATION (CUSTOM PROCEDURE TRAY) ×1 IMPLANT
SET ATX-X65L (MISCELLANEOUS) IMPLANT
STENT ONYX FRONTIER 2.5X22 (Permanent Stent) IMPLANT
TUBING CIL FLEX 10 FLL-RA (TUBING) IMPLANT
WIRE RUNTHROUGH .014X180CM (WIRE) IMPLANT

## 2023-08-31 NOTE — Progress Notes (Signed)
TR band removed at 2025, gauze dressing applied. Right radial level 0, clean, dry, and intact. Patient walked to the bathroom without difficulties.

## 2023-08-31 NOTE — Discharge Summary (Signed)
Discharge Summary for Same Day PCI   Patient ID: Ruben Rodriguez MRN: [id number]; DOB: 06/18/1946  Admit date: 08/31/2023 Discharge date: 08/31/2023  Primary Care Provider: Lupita Raider, MD  Primary Cardiologist: Olga Millers, MD  Primary Electrophysiologist:  None   Discharge Diagnoses    Active Problems:   Coronary artery disease  Diagnostic Studies/Procedures    Cardiac Catheterization 08/31/2023:    RPDA lesion is 40% stenosed.   Dist LAD lesion is 60% stenosed.   Mid LAD-2 lesion is 90% stenosed.   Post intervention, there is a 0% residual stenosis.   Post intervention, there is a 0% residual stenosis.   Severe mid-LAD stenosis, treated successfully with PCI guided by angio-FFR, using a 2.5x22 mm Onyx DES Non-obstructive RCA stenosis, severe distal branch vessel stenosis of the right PLA branch unchanged from prior study (small vessel) Patent left main and left circumflex without significant stenoses 4.   Normal LVEDP   Recommend: same day PCI protocol. ASA and clopidogrel x 6 months without interruption  Diagnostic Dominance: Right  Intervention   _____________   History of Present Illness     Ruben Rodriguez is a 77 y.o. male with CAD, hypertension, and hyperlipidemia.   Renal Dopplers in 2018 showed evidence of renal artery stent.  Coronary score in September 2022 was 561 (60th percentile).  There was also evidence of aortic atherosclerosis.  Coronary CT angiogram in 08/15/2021 revealed coronary calcium score 429 with severe stenosis in the mid LAD, mild to moderate mid coronary artery disease (40 to 50%), moderate disease in the posterior lateral (50 to 69%), LAD lesion significant by FFR.  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  Cardiac catheterization in December 2022 revealed 58% mid LAD stenosis, 60% distal LAD stenosis, mild diffuse disease throughout the RCA 90% stenosis of the first posterolateral branch.  Medical therapy was recommended.   It was noted that  should he develop symptoms, PCI of LAD could be performed.  Carotid ultrasound in September 2023 showed no significant stenosis of the internal arteries. 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  He was seen in the office on 08/17/2022 and was stable from a cardiac standpoint.  He denied symptoms concerning for angina. He was hospitalized in September 2024 in the setting of duodenal ulcers. He was started on pantoprazole.    He presented to the office on 11/22 today for follow-up accompanied by his wife.  Since his last visit he had been stable from a cardiac standpoint.  He did note increased dyspnea on exertion, generalized fatigue, decreased activity tolerance.  He was concerned that his symptoms were related to Repatha.  He stopped taking the medication though he had noticed not much improvement in his symptoms.  He was not interested in restarting PCSK9 inhibitor at this point in time. He was concerned that his recent symptoms could also represent possible anginal equivalent.  Overall, he feels poorly.  Given his symptoms his case was discussed with DOD, Dr. Tresa Endo with recommendations to pursue outpatient cardiac catheterization.  Possible antianginal therapy with long-acting nitrates were discussed but patient declined.  Hospital Course     The patient underwent cardiac cath as noted above with severe mid LAD stenosis treated with PCI guided FFR and DES x 1.  Nonobstructive RCA stenosis with severe distal branch vessel stenosis in the right PLA branch unchanged from prior study, patent left main and circumflex without significant stenosis.  Plan for DAPT with ASA/Plavix for  at least 6 months. The patient was seen by cardiac rehab while in short stay. There were no observed complications post cath. Radial cath site was re-evaluated prior to discharge and found to be stable without any complications. Instructions/precautions regarding  cath site care were given prior to discharge.  Alquan Peary Athalia was seen by Dr. Excell Seltzer and determined stable for discharge home. Follow up with our office has been arranged. Medications are listed below. Pertinent changes include addition of plavix.    _____________  Cath/PCI Registry Performance & Quality Measures: Aspirin prescribed? - Yes ADP Receptor Inhibitor (Plavix/Clopidogrel, Brilinta/Ticagrelor or Effient/Prasugrel) prescribed (includes medically managed patients)? - Yes High Intensity Statin (Lipitor 40-80mg  or Crestor 20-40mg ) prescribed? - No - intolerant  For EF <40%, was ACEI/ARB prescribed? - Not Applicable (EF >/= 40%) For EF <40%, Aldosterone Antagonist (Spironolactone or Eplerenone) prescribed? - Not Applicable (EF >/= 40%) Cardiac Rehab Phase II ordered (Included Medically managed Patients)? - Yes  _____________   Discharge Vitals Blood pressure (!) 146/77, pulse (!) 0, temperature 98.2 F (36.8 C), temperature source Oral, resp. rate 14, height 5\' 9"  (1.753 m), weight 96.2 kg, SpO2 96%.  Filed Weights   08/31/23 1328  Weight: 96.2 kg    Last Labs & Radiologic Studies    CBC No results for input(s): "WBC", "NEUTROABS", "HGB", "HCT", "MCV", "PLT" in the last 72 hours. Basic Metabolic Panel No results for input(s): "NA", "K", "CL", "CO2", "GLUCOSE", "BUN", "CREATININE", "CALCIUM", "MG", "PHOS" in the last 72 hours. Liver Function Tests No results for input(s): "AST", "ALT", "ALKPHOS", "BILITOT", "PROT", "ALBUMIN" in the last 72 hours. No results for input(s): "LIPASE", "AMYLASE" in the last 72 hours. High Sensitivity Troponin:   No results for input(s): "TROPONINIHS" in the last 720 hours.  BNP Invalid input(s): "POCBNP" D-Dimer No results for input(s): "DDIMER" in the last 72 hours. Hemoglobin A1C No results for input(s): "HGBA1C" in the last 72 hours. Fasting Lipid Panel No results for input(s): "CHOL", "HDL", "LDLCALC", "TRIG", "CHOLHDL", "LDLDIRECT" in  the last 72 hours. Thyroid Function Tests No results for input(s): "TSH", "T4TOTAL", "T3FREE", "THYROIDAB" in the last 72 hours.  Invalid input(s): "FREET3" _____________  CARDIAC CATHETERIZATION  Result Date: 08/31/2023   RPDA lesion is 40% stenosed.   Dist LAD lesion is 60% stenosed.   Mid LAD-2 lesion is 90% stenosed.   Post intervention, there is a 0% residual stenosis.   Post intervention, there is a 0% residual stenosis. Severe mid-LAD stenosis, treated successfully with PCI guided by angio-FFR, using a 2.5x22 mm Onyx DES Non-obstructive RCA stenosis, severe distal branch vessel stenosis of the right PLA branch unchanged from prior study (small vessel) Patent left main and left circumflex without significant stenoses 4.   Normal LVEDP Recommend: same day PCI protocol. ASA and clopidogrel x 6 months without interruption    Disposition   Pt is being discharged home today in good condition.  Follow-up Plans & Appointments     Discharge Instructions     AMB Referral to Cardiac Rehabilitation - Phase II   Complete by: As directed    Diagnosis: Coronary Stents   After initial evaluation and assessments completed: Virtual Based Care may be provided alone or in conjunction with Phase 2 Cardiac Rehab based on patient barriers.: Yes   Intensive Cardiac Rehabilitation (ICR) [facility] location only OR Traditional Cardiac Rehabilitation (TCR) *If criteria for ICR are not met will enroll in TCR [facility] only): Yes        Discharge Medications   Allergies as  of 08/31/2023       Reactions   Fish Allergy Anaphylaxis, Rash   Tolerates salmon and tuna   Penicillins Anaphylaxis   Repatha [evolocumab] Shortness Of Breath, Rash   Pain, vision problems    Atorvastatin Other (See Comments)   Muscle aches   Ezetimibe Other (See Comments)   Muscle aches   Zocor [simvastatin] Other (See Comments)   Muscle aches   Amoxicillin Other (See Comments)   MD advised patient to not take this, either   Crestor  [rosuvastatin Calcium] Other (See Comments)   Joint and muscle pain        Medication List     TAKE these medications    acetaminophen 650 MG CR tablet Commonly known as: TYLENOL Take 1,300 mg by mouth every 8 (eight) hours as needed for pain.   amLODipine 10 MG tablet Commonly known as: NORVASC Take 10 mg by mouth daily.   aspirin EC 81 MG tablet Take 81 mg by mouth daily. Swallow whole.   bisoprolol-hydrochlorothiazide 10-6.25 MG tablet Commonly known as: ZIAC Take 1 tablet by mouth daily.   clopidogrel 75 MG tablet Commonly known as: Plavix Take 1 tablet (75 mg total) by mouth daily.   diphenhydrAMINE 25 MG tablet Commonly known as: BENADRYL Take 25 mg by mouth daily as needed for itching.   finasteride 5 MG tablet Commonly known as: PROSCAR Take 5 mg by mouth at bedtime.   fluocinonide ointment 0.05 % Commonly known as: LIDEX Apply 1 Application topically daily as needed (dryness inside ears).   nitroGLYCERIN 0.4 MG SL tablet Commonly known as: Nitrostat Place 1 tablet (0.4 mg total) under the tongue every 5 (five) minutes as needed.   pantoprazole 40 MG tablet Commonly known as: PROTONIX Take 1 tablet (40 mg total) by mouth 2 (two) times daily.   REFRESH OP Place 1 drop into both eyes daily as needed (dry eyes).           Allergies Allergies  Allergen Reactions   Fish Allergy Anaphylaxis and Rash    Tolerates salmon and tuna   Penicillins Anaphylaxis   Repatha [Evolocumab] Shortness Of Breath and Rash    Pain, vision problems    Atorvastatin Other (See Comments)    Muscle aches   Ezetimibe Other (See Comments)    Muscle aches   Zocor [Simvastatin] Other (See Comments)    Muscle aches   Amoxicillin Other (See Comments)    MD advised patient to not take this, either   Crestor [Rosuvastatin Calcium] Other (See Comments)    Joint and muscle pain    Outstanding Labs/Studies   N/a   Duration of Discharge Encounter   Greater than 30  minutes including physician time.  Signed, Laverda Page, NP 08/31/2023, 6:37 PM

## 2023-08-31 NOTE — Interval H&P Note (Signed)
History and Physical Interval Note:  08/31/2023 2:56 PM  Ruben Rodriguez  has presented today for surgery, with the diagnosis of doe - cad - unstable angina.  The various methods of treatment have been discussed with the patient and family. After consideration of risks, benefits and other options for treatment, the patient has consented to  Procedure(s): LEFT HEART CATH AND CORONARY ANGIOGRAPHY (N/A) as a surgical intervention.  The patient's history has been reviewed, patient examined, no change in status, stable for surgery.  I have reviewed the patient's chart and labs.  Questions were answered to the patient's satisfaction.     Tonny Bollman

## 2023-08-31 NOTE — Discharge Instructions (Signed)

## 2023-09-01 ENCOUNTER — Telehealth (HOSPITAL_COMMUNITY): Payer: Self-pay

## 2023-09-01 ENCOUNTER — Encounter (HOSPITAL_COMMUNITY): Payer: Self-pay | Admitting: Cardiovascular Disease

## 2023-09-01 DIAGNOSIS — Z955 Presence of coronary angioplasty implant and graft: Secondary | ICD-10-CM

## 2023-09-01 NOTE — Telephone Encounter (Signed)
Called pt to discuss CR, HH diet, and ex guidelines. Pt will be referred to [facility].

## 2023-09-05 ENCOUNTER — Telehealth (HOSPITAL_COMMUNITY): Payer: Self-pay

## 2023-09-05 NOTE — Telephone Encounter (Signed)
Called patient to see if he is interested in the Cardiac Rehab Program. Patient expressed interest. Explained scheduling process and went over insurance, patient verbalized understanding. Will contact patient for scheduling once f/u has been completed.  °

## 2023-09-18 NOTE — Progress Notes (Signed)
HPI: FU CAD. Renal Dopplers November 2018 showed no renal artery stenosis. Cardiac CTA (08/16/21); Calcium score 429, severe stenosis in the mid LAD, mild to moderate mid right coronary artery disease at 40 to 50%, moderate disease in the posterior lateral at 50 to 69%.  LAD lesion significant by FFR.  Abdominal ultrasound December 2022 showed no aneurysm. Carotid Doppler September 2023 showed no significant stenosis of the internal carotid arteries.  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  Patient had follow-up catheterization October 2024 demonstrating 90% mid LAD, 60% distal LAD and 40% PDA.  Patient had PCI of the mid LAD lesion with drug-eluting stent.  Since last seen, he denies dyspnea, chest pain, palpitations or syncope.  Current Outpatient Medications  Medication Sig Dispense Refill   acetaminophen (TYLENOL) 650 MG CR tablet Take 1,300 mg by mouth every 8 (eight) hours as needed for pain.     amLODipine (NORVASC) 10 MG tablet Take 10 mg by mouth daily.     aspirin EC 81 MG tablet Take 81 mg by mouth daily. Swallow whole.     bisoprolol-hydrochlorothiazide (ZIAC) 10-6.25 MG per tablet Take 1 tablet by mouth daily.     clopidogrel (PLAVIX) 75 MG tablet Take 1 tablet (75 mg total) by mouth daily. 30 tablet 0   diphenhydrAMINE (BENADRYL) 25 MG tablet Take 25 mg by mouth daily as needed for itching.     finasteride (PROSCAR) 5 MG tablet Take 5 mg by mouth at bedtime.     fluocinonide ointment (LIDEX) 0.05 % Apply 1 Application topically daily as needed (dryness inside ears).     nitroGLYCERIN (NITROSTAT) 0.4 MG SL tablet Place 1 tablet (0.4 mg total) under the tongue every 5 (five) minutes as needed. 25 tablet 2   pantoprazole (PROTONIX) 40 MG tablet Take 1 tablet (40 mg total) by mouth 2 (two) times daily. 240 tablet 0   Polyvinyl Alcohol-Povidone (REFRESH OP) Place 1 drop into both eyes daily as needed (dry eyes).     No current  facility-administered medications for this visit.     Past Medical History:  Diagnosis Date   Arthritis    BPH (benign prostatic hyperplasia)    Chronic back pain    Family history of adverse reaction to anesthesia    father died during carotid artery surgery   GERD (gastroesophageal reflux disease)    Hyperlipidemia    Hypertension    Pneumonia    Spinal stenosis     Past Surgical History:  Procedure Laterality Date   BIOPSY  05/31/2023   Procedure: BIOPSY;  Surgeon: Charlott Rakes, MD;  Location: [facility] ENDOSCOPY;  Service: Gastroenterology;;   COLONOSCOPY     CORONARY PRESSURE/FFR WITH 3D MAPPING N/A 08/31/2023   Procedure: Coronary Pressure/FFR w/3D Mapping;  Surgeon: Tonny Bollman, MD;  Location: [facility] INVASIVE CV LAB;  Service: Cardiovascular;  Laterality: N/A;   CORONARY STENT INTERVENTION N/A 08/31/2023   Procedure: CORONARY STENT INTERVENTION;  Surgeon: Tonny Bollman, MD;  Location: [facility] INVASIVE CV LAB;  Service: Cardiovascular;  Laterality: N/A;   ESOPHAGOGASTRODUODENOSCOPY (EGD) WITH PROPOFOL N/A 05/31/2023   Procedure: ESOPHAGOGASTRODUODENOSCOPY (EGD) WITH PROPOFOL;  Surgeon: Charlott Rakes, MD;  Location: [facility] ENDOSCOPY;  Service: Gastroenterology;  Laterality: N/A;   FINGER SURGERY     to remove a tumor from right little finger   LAMINECTOMY WITH POSTERIOR LATERAL ARTHRODESIS LEVEL 2 N/A 08/01/2018   Procedure: LUMBAR THREE- LUMBAR FOUR, LUMBAR FOUR-LUMBAR FIVE DECOMPRESSION, LUMBAR THREE-LUMBAR FOUR, LUMBAR  FOUR-LUMBAR FIVE POSTEROLATERAL ARTHRODESIS;  Surgeon: Shirlean Kelly, MD;  Location: [facility] OR;  Service: Neurosurgery;  Laterality: N/A;   LEFT HEART CATH AND CORONARY ANGIOGRAPHY N/A 08/27/2021   Procedure: LEFT HEART CATH AND CORONARY ANGIOGRAPHY;  Surgeon: Tonny Bollman, MD;  Location: [facility] INVASIVE CV LAB;  Service: Cardiovascular;  Laterality: N/A;   LEFT HEART CATH AND CORONARY ANGIOGRAPHY N/A 08/31/2023   Procedure: LEFT HEART CATH AND CORONARY ANGIOGRAPHY;   Surgeon: Tonny Bollman, MD;  Location: [facility] INVASIVE CV LAB;  Service: Cardiovascular;  Laterality: N/A;    Social History   Socioeconomic History   Marital status: Married    Spouse name: Not on file   Number of children: 2   Years of education: Not on file   Highest education level: Not on file  Occupational History   Not on file  Tobacco Use   Smoking status: Former   Smokeless tobacco: Never  Vaping Use   Vaping status: Never Used  Substance and Sexual Activity   Alcohol use: No    Comment: Occasional   Drug use: No   Sexual activity: Not on file  Other Topics Concern   Not on file  Social History Narrative   Not on file   Social Drivers of Health   Financial Resource Strain: Not on file  Food Insecurity: No Food Insecurity (06/01/2023)   Hunger Vital Sign    Worried About Running Out of Food in the Last Year: Never true    Ran Out of Food in the Last Year: Never true  Transportation Needs: No Transportation Needs (06/01/2023)   PRAPARE - Administrator, Civil Service (Medical): No    Lack of Transportation (Non-Medical): No  Physical Activity: Not on file  Stress: Not on file  Social Connections: Not on file  Intimate Partner Violence: Not At Risk (06/01/2023)   Humiliation, Afraid, Rape, and Kick questionnaire    Fear of Current or Ex-Partner: No    Emotionally Abused: No    Physically Abused: No    Sexually Abused: No    Family History  Problem Relation Age of Onset   Hypertension Mother     ROS: no fevers or chills, productive cough, hemoptysis, dysphasia, odynophagia, melena, hematochezia, dysuria, hematuria, rash, seizure activity, orthopnea, PND, pedal edema, claudication. Remaining systems are negative.  Physical Exam: Well-developed well-nourished in no acute distress.  Skin is warm and dry.  HEENT is normal.  Neck is supple.  Chest is clear to auscultation with normal expansion.  Cardiovascular exam is regular rate and rhythm.   Abdominal exam nontender or distended. No masses palpated. Extremities show no edema. neuro grossly intact   A/P  1 coronary artery disease-status post recent PCI of the LAD.  Continue aspirin, Plavix; intolerant to statins.  2 hyperlipidemia-intolerant to statins.  He also did not tolerate Repatha, Praluent or Zetia.  Will continue diet.  3 hypertension-blood pressure controlled.  Continue present medications.  Olga Millers, MD

## 2023-09-22 ENCOUNTER — Ambulatory Visit: Payer: Medicare HMO | Attending: Cardiology | Admitting: Cardiology

## 2023-09-22 ENCOUNTER — Encounter: Payer: Self-pay | Admitting: Cardiology

## 2023-09-22 VITALS — BP 124/74 | HR 63 | Ht 69.0 in | Wt 220.0 lb

## 2023-09-22 DIAGNOSIS — E785 Hyperlipidemia, unspecified: Secondary | ICD-10-CM | POA: Diagnosis not present

## 2023-09-22 DIAGNOSIS — I25118 Atherosclerotic heart disease of native coronary artery with other forms of angina pectoris: Secondary | ICD-10-CM | POA: Diagnosis not present

## 2023-09-22 DIAGNOSIS — I10 Essential (primary) hypertension: Secondary | ICD-10-CM

## 2023-09-22 MED ORDER — CLOPIDOGREL BISULFATE 75 MG PO TABS: 75.0000 mg | ORAL_TABLET | Freq: Every day | ORAL | 3 refills | Status: DC

## 2023-09-22 NOTE — Patient Instructions (Signed)
    Follow-Up: At [facility], you and your health needs are our priority.  As part of our continuing mission to provide you with exceptional heart care, we have created designated Provider Care Teams.  These Care Teams include your primary Cardiologist (physician) and Advanced Practice Providers (APPs -  Physician Assistants and Nurse Practitioners) who all work together to provide you with the care you need, when you need it.    Your next appointment:   6 month(s)  Provider:   Olga Millers, MD

## 2023-09-26 ENCOUNTER — Telehealth (HOSPITAL_COMMUNITY): Payer: Self-pay

## 2023-09-26 NOTE — Telephone Encounter (Signed)
 Called and spoke with pt in regards to CR, pt stated he is not interested at this time.   Closed referral

## 2023-10-02 DIAGNOSIS — Z85828 Personal history of other malignant neoplasm of skin: Secondary | ICD-10-CM | POA: Diagnosis not present

## 2023-10-02 DIAGNOSIS — D225 Melanocytic nevi of trunk: Secondary | ICD-10-CM | POA: Diagnosis not present

## 2023-10-02 DIAGNOSIS — L570 Actinic keratosis: Secondary | ICD-10-CM | POA: Diagnosis not present

## 2023-10-02 DIAGNOSIS — L821 Other seborrheic keratosis: Secondary | ICD-10-CM | POA: Diagnosis not present

## 2023-10-30 ENCOUNTER — Ambulatory Visit: Payer: Medicare HMO | Admitting: Cardiology

## 2023-11-06 DIAGNOSIS — Z961 Presence of intraocular lens: Secondary | ICD-10-CM | POA: Diagnosis not present

## 2023-11-06 DIAGNOSIS — H04123 Dry eye syndrome of bilateral lacrimal glands: Secondary | ICD-10-CM | POA: Diagnosis not present

## 2023-11-06 DIAGNOSIS — H52203 Unspecified astigmatism, bilateral: Secondary | ICD-10-CM | POA: Diagnosis not present

## 2023-12-06 LAB — LAB REPORT - SCANNED
A1c: 6.1
Albumin, Urine POC: 2.51
Creatinine, POC: 80 mg/dL
EGFR: 67
Microalb Creat Ratio: 31.3

## 2024-02-26 DIAGNOSIS — A6920 Lyme disease, unspecified: Secondary | ICD-10-CM | POA: Diagnosis not present

## 2024-03-01 ENCOUNTER — Encounter: Payer: Self-pay | Admitting: Cardiology

## 2024-03-17 DIAGNOSIS — A6920 Lyme disease, unspecified: Secondary | ICD-10-CM | POA: Diagnosis not present

## 2024-03-18 DIAGNOSIS — L820 Inflamed seborrheic keratosis: Secondary | ICD-10-CM | POA: Diagnosis not present

## 2024-06-10 LAB — LAB REPORT - SCANNED
A1c: 6.1
EGFR: 64

## 2024-06-29 ENCOUNTER — Other Ambulatory Visit: Payer: Self-pay | Admitting: Cardiology

## 2024-07-10 DIAGNOSIS — Z860100 Personal history of colon polyps, unspecified: Secondary | ICD-10-CM | POA: Diagnosis not present

## 2024-07-10 DIAGNOSIS — K279 Peptic ulcer, site unspecified, unspecified as acute or chronic, without hemorrhage or perforation: Secondary | ICD-10-CM | POA: Diagnosis not present

## 2024-08-05 NOTE — Progress Notes (Unsigned)
 HPI: FU CAD. Renal Dopplers November 2018 showed no renal artery stenosis. Cardiac CTA (08/16/21); Calcium  score 429, severe stenosis in the mid LAD, mild to moderate mid right coronary artery disease at 40 to 50%, moderate disease in the posterior lateral at 50 to 69%.  LAD lesion significant by FFR.  Abdominal ultrasound December 2022 showed no aneurysm. Carotid Doppler September 2023 showed no significant stenosis of the internal carotid arteries.  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  Patient had follow-up catheterization December 2024 demonstrating 90% mid LAD, 60% distal LAD and 40% PDA.  Patient had PCI of the mid LAD lesion with drug-eluting stent.  Since last seen, patient denies dyspnea, chest pain, palpitations or syncope.  Current Outpatient Medications  Medication Sig Dispense Refill   acetaminophen  (TYLENOL ) 650 MG CR tablet Take 1,300 mg by mouth every 8 (eight) hours as needed for pain.     amLODipine  (NORVASC ) 10 MG tablet Take 10 mg by mouth daily.     bisoprolol -hydrochlorothiazide  (ZIAC ) 10-6.25 MG per tablet Take 1 tablet by mouth daily.     clopidogrel  (PLAVIX ) 75 MG tablet TAKE 1 TABLET BY MOUTH EVERY DAY 90 tablet 0   diphenhydrAMINE  (BENADRYL ) 25 MG tablet Take 25 mg by mouth daily as needed for itching.     finasteride  (PROSCAR ) 5 MG tablet Take 5 mg by mouth at bedtime.     fluocinonide ointment (LIDEX) 0.05 % Apply 1 Application topically daily as needed (dryness inside ears).     nitroGLYCERIN  (NITROSTAT ) 0.4 MG SL tablet Place 1 tablet (0.4 mg total) under the tongue every 5 (five) minutes as needed. 25 tablet 2   pantoprazole  (PROTONIX ) 40 MG tablet Take 1 tablet (40 mg total) by mouth 2 (two) times daily. 240 tablet 0   Polyvinyl Alcohol-Povidone (REFRESH OP) Place 1 drop into both eyes daily as needed (dry eyes).     aspirin  EC 81 MG tablet Take 81 mg by mouth daily. Swallow whole.     No current  facility-administered medications for this visit.     Past Medical History:  Diagnosis Date   Arthritis    BPH (benign prostatic hyperplasia)    Chronic back pain    Family history of adverse reaction to anesthesia    father died during carotid artery surgery   GERD (gastroesophageal reflux disease)    Hyperlipidemia    Hypertension    Pneumonia    Spinal stenosis     Past Surgical History:  Procedure Laterality Date   BIOPSY  05/31/2023   Procedure: BIOPSY;  Surgeon: Dianna Specking, MD;  Location: [facility] ENDOSCOPY;  Service: Gastroenterology;;   COLONOSCOPY     CORONARY PRESSURE/FFR WITH 3D MAPPING N/A 08/31/2023   Procedure: Coronary Pressure/FFR w/3D Mapping;  Surgeon: Wonda Sharper, MD;  Location: [facility] INVASIVE CV LAB;  Service: Cardiovascular;  Laterality: N/A;   CORONARY STENT INTERVENTION N/A 08/31/2023   Procedure: CORONARY STENT INTERVENTION;  Surgeon: Wonda Sharper, MD;  Location: [facility] INVASIVE CV LAB;  Service: Cardiovascular;  Laterality: N/A;   ESOPHAGOGASTRODUODENOSCOPY (EGD) WITH PROPOFOL  N/A 05/31/2023   Procedure: ESOPHAGOGASTRODUODENOSCOPY (EGD) WITH PROPOFOL ;  Surgeon: Dianna Specking, MD;  Location: [facility] ENDOSCOPY;  Service: Gastroenterology;  Laterality: N/A;   FINGER SURGERY     to remove a tumor from right little finger   LAMINECTOMY WITH POSTERIOR LATERAL ARTHRODESIS LEVEL 2 N/A 08/01/2018   Procedure: LUMBAR THREE- LUMBAR FOUR, LUMBAR FOUR-LUMBAR FIVE DECOMPRESSION, LUMBAR THREE-LUMBAR FOUR, LUMBAR FOUR-LUMBAR FIVE  POSTEROLATERAL ARTHRODESIS;  Surgeon: Alix Charleston, MD;  Location: [facility] OR;  Service: Neurosurgery;  Laterality: N/A;   LEFT HEART CATH AND CORONARY ANGIOGRAPHY N/A 08/27/2021   Procedure: LEFT HEART CATH AND CORONARY ANGIOGRAPHY;  Surgeon: Wonda Sharper, MD;  Location: [facility] INVASIVE CV LAB;  Service: Cardiovascular;  Laterality: N/A;   LEFT HEART CATH AND CORONARY ANGIOGRAPHY N/A 08/31/2023   Procedure: LEFT HEART CATH AND CORONARY ANGIOGRAPHY;   Surgeon: Wonda Sharper, MD;  Location: [facility] INVASIVE CV LAB;  Service: Cardiovascular;  Laterality: N/A;    Social History   Socioeconomic History   Marital status: Married    Spouse name: Not on file   Number of children: 2   Years of education: Not on file   Highest education level: Not on file  Occupational History   Not on file  Tobacco Use   Smoking status: Former   Smokeless tobacco: Never  Vaping Use   Vaping status: Never Used  Substance and Sexual Activity   Alcohol use: No    Comment: Occasional   Drug use: No   Sexual activity: Not on file  Other Topics Concern   Not on file  Social History Narrative   Not on file   Social Drivers of Health   Financial Resource Strain: Not on file  Food Insecurity: No Food Insecurity (06/01/2023)   Hunger Vital Sign    Worried About Running Out of Food in the Last Year: Never true    Ran Out of Food in the Last Year: Never true  Transportation Needs: No Transportation Needs (06/01/2023)   PRAPARE - Administrator, Civil Service (Medical): No    Lack of Transportation (Non-Medical): No  Physical Activity: Not on file  Stress: Not on file  Social Connections: Not on file  Intimate Partner Violence: Not At Risk (06/01/2023)   Humiliation, Afraid, Rape, and Kick questionnaire    Fear of Current or Ex-Partner: No    Emotionally Abused: No    Physically Abused: No    Sexually Abused: No    Family History  Problem Relation Age of Onset   Hypertension Mother     ROS: no fevers or chills, productive cough, hemoptysis, dysphasia, odynophagia, melena, hematochezia, dysuria, hematuria, rash, seizure activity, orthopnea, PND, pedal edema, claudication. Remaining systems are negative.  Physical Exam: Well-developed well-nourished in no acute distress.  Skin is warm and dry.  HEENT is normal.  Neck is supple.  Chest is clear to auscultation with normal expansion.  Cardiovascular exam is regular rate and rhythm.   Abdominal exam nontender or distended. No masses palpated. Extremities show no edema. neuro grossly intact  EKG Interpretation Date/Time:  Thursday August 08 2024 10:56:37 EST Ventricular Rate:  53 PR Interval:  192 QRS Duration:  86 QT Interval:  420 QTC Calculation: 394 R Axis:   -9  Text Interpretation: Sinus bradycardia Minimal voltage criteria for LVH, may be normal variant ( R in aVL ) Confirmed by Pietro Rogue ([id number]) on 08/08/2024 10:58:48 AM    A/P  1 coronary artery disease-patient has had previous PCI of his LAD.  Continue Plavix .  Aspirin  was discontinued previously due to gastric irritation.  Patient is intolerant to statins.  2 hyperlipidemia-previously did not tolerate statins, Zetia or PCSK9 inhibitor.  Continue diet.  3 hypertension-patient's blood pressure is controlled.  Continue present medical regimen.  Rogue Pietro, MD

## 2024-08-08 ENCOUNTER — Ambulatory Visit: Attending: Cardiology | Admitting: Cardiology

## 2024-08-08 ENCOUNTER — Encounter: Payer: Self-pay | Admitting: Cardiology

## 2024-08-08 VITALS — BP 124/70 | HR 53 | Ht 69.0 in | Wt 214.0 lb

## 2024-08-08 DIAGNOSIS — I25118 Atherosclerotic heart disease of native coronary artery with other forms of angina pectoris: Secondary | ICD-10-CM

## 2024-08-08 DIAGNOSIS — I10 Essential (primary) hypertension: Secondary | ICD-10-CM

## 2024-08-08 DIAGNOSIS — E785 Hyperlipidemia, unspecified: Secondary | ICD-10-CM

## 2024-08-08 NOTE — Patient Instructions (Signed)

## 2024-08-29 ENCOUNTER — Observation Stay (HOSPITAL_BASED_OUTPATIENT_CLINIC_OR_DEPARTMENT_OTHER)
Admission: EM | Admit: 2024-08-29 | Discharge: 2024-09-02 | Disposition: A | Attending: Internal Medicine | Admitting: Internal Medicine

## 2024-08-29 ENCOUNTER — Emergency Department (HOSPITAL_BASED_OUTPATIENT_CLINIC_OR_DEPARTMENT_OTHER)

## 2024-08-29 ENCOUNTER — Other Ambulatory Visit: Payer: Self-pay

## 2024-08-29 ENCOUNTER — Encounter (HOSPITAL_BASED_OUTPATIENT_CLINIC_OR_DEPARTMENT_OTHER): Payer: Self-pay | Admitting: Emergency Medicine

## 2024-08-29 DIAGNOSIS — K922 Gastrointestinal hemorrhage, unspecified: Principal | ICD-10-CM | POA: Diagnosis present

## 2024-08-29 DIAGNOSIS — Z6831 Body mass index (BMI) 31.0-31.9, adult: Secondary | ICD-10-CM | POA: Insufficient documentation

## 2024-08-29 DIAGNOSIS — Z7901 Long term (current) use of anticoagulants: Secondary | ICD-10-CM | POA: Insufficient documentation

## 2024-08-29 DIAGNOSIS — Z79899 Other long term (current) drug therapy: Secondary | ICD-10-CM | POA: Insufficient documentation

## 2024-08-29 DIAGNOSIS — Z87891 Personal history of nicotine dependence: Secondary | ICD-10-CM | POA: Insufficient documentation

## 2024-08-29 DIAGNOSIS — N400 Enlarged prostate without lower urinary tract symptoms: Secondary | ICD-10-CM | POA: Diagnosis present

## 2024-08-29 DIAGNOSIS — Z743 Need for continuous supervision: Secondary | ICD-10-CM | POA: Diagnosis not present

## 2024-08-29 DIAGNOSIS — N401 Enlarged prostate with lower urinary tract symptoms: Principal | ICD-10-CM | POA: Insufficient documentation

## 2024-08-29 DIAGNOSIS — E785 Hyperlipidemia, unspecified: Secondary | ICD-10-CM | POA: Diagnosis present

## 2024-08-29 DIAGNOSIS — R001 Bradycardia, unspecified: Secondary | ICD-10-CM | POA: Diagnosis not present

## 2024-08-29 DIAGNOSIS — R338 Other retention of urine: Secondary | ICD-10-CM | POA: Diagnosis not present

## 2024-08-29 DIAGNOSIS — D649 Anemia, unspecified: Secondary | ICD-10-CM | POA: Diagnosis not present

## 2024-08-29 DIAGNOSIS — K219 Gastro-esophageal reflux disease without esophagitis: Secondary | ICD-10-CM | POA: Insufficient documentation

## 2024-08-29 DIAGNOSIS — R339 Retention of urine, unspecified: Secondary | ICD-10-CM | POA: Diagnosis not present

## 2024-08-29 DIAGNOSIS — I2510 Atherosclerotic heart disease of native coronary artery without angina pectoris: Secondary | ICD-10-CM | POA: Diagnosis present

## 2024-08-29 DIAGNOSIS — E66811 Obesity, class 1: Secondary | ICD-10-CM | POA: Insufficient documentation

## 2024-08-29 DIAGNOSIS — I10 Essential (primary) hypertension: Secondary | ICD-10-CM | POA: Diagnosis present

## 2024-08-29 DIAGNOSIS — R1084 Generalized abdominal pain: Secondary | ICD-10-CM | POA: Diagnosis not present

## 2024-08-29 LAB — CBC WITH DIFFERENTIAL/PLATELET
Abs Immature Granulocytes: 0.02 K/uL (ref 0.00–0.07)
Basophils Absolute: 0 K/uL (ref 0.0–0.1)
Basophils Relative: 0 %
Eosinophils Absolute: 0.1 K/uL (ref 0.0–0.5)
Eosinophils Relative: 1 %
HCT: 40.9 % (ref 39.0–52.0)
Hemoglobin: 13.2 g/dL (ref 13.0–17.0)
Immature Granulocytes: 0 %
Lymphocytes Relative: 22 %
Lymphs Abs: 1.4 K/uL (ref 0.7–4.0)
MCH: 26.1 pg (ref 26.0–34.0)
MCHC: 32.3 g/dL (ref 30.0–36.0)
MCV: 81 fL (ref 80.0–100.0)
Monocytes Absolute: 0.6 K/uL (ref 0.1–1.0)
Monocytes Relative: 9 %
Neutro Abs: 4.5 K/uL (ref 1.7–7.7)
Neutrophils Relative %: 68 %
Platelets: 157 K/uL (ref 150–400)
RBC: 5.05 MIL/uL (ref 4.22–5.81)
RDW: 14.6 % (ref 11.5–15.5)
WBC: 6.6 K/uL (ref 4.0–10.5)
nRBC: 0 % (ref 0.0–0.2)

## 2024-08-29 LAB — COMPREHENSIVE METABOLIC PANEL WITH GFR
ALT: 15 U/L (ref 0–44)
AST: 16 U/L (ref 15–41)
Albumin: 4 g/dL (ref 3.5–5.0)
Alkaline Phosphatase: 35 U/L — ABNORMAL LOW (ref 38–126)
Anion gap: 9 (ref 5–15)
BUN: 21 mg/dL (ref 8–23)
CO2: 28 mmol/L (ref 22–32)
Calcium: 9.7 mg/dL (ref 8.9–10.3)
Chloride: 104 mmol/L (ref 98–111)
Creatinine, Ser: 1.33 mg/dL — ABNORMAL HIGH (ref 0.61–1.24)
GFR, Estimated: 55 mL/min — ABNORMAL LOW (ref >60)
Glucose, Bld: 107 mg/dL — ABNORMAL HIGH (ref 70–99)
Potassium: 4.8 mmol/L (ref 3.5–5.1)
Sodium: 140 mmol/L (ref 135–145)
Total Bilirubin: 0.5 mg/dL (ref 0.0–1.2)
Total Protein: 6.2 g/dL — ABNORMAL LOW (ref 6.5–8.1)

## 2024-08-29 LAB — TROPONIN T, HIGH SENSITIVITY
Troponin T High Sensitivity: <15 ng/L (ref 0–19)
Troponin T High Sensitivity: <15 ng/L (ref 0–19)

## 2024-08-29 LAB — CBC
HCT: 40.3 % (ref 39.0–52.0)
Hemoglobin: 12.6 g/dL — ABNORMAL LOW (ref 13.0–17.0)
MCH: 26.1 pg (ref 26.0–34.0)
MCHC: 31.3 g/dL (ref 30.0–36.0)
MCV: 83.6 fL (ref 80.0–100.0)
Platelets: 168 K/uL (ref 150–400)
RBC: 4.82 MIL/uL (ref 4.22–5.81)
RDW: 14.5 % (ref 11.5–15.5)
WBC: 8.2 K/uL (ref 4.0–10.5)
nRBC: 0 % (ref 0.0–0.2)

## 2024-08-29 LAB — LIPASE, BLOOD: Lipase: 13 U/L (ref 11–51)

## 2024-08-29 LAB — TYPE AND SCREEN
ABO/RH(D): O POS
Antibody Screen: NEGATIVE

## 2024-08-29 LAB — OCCULT BLOOD X 1 CARD TO LAB, STOOL: Fecal Occult Bld: POSITIVE — AB

## 2024-08-29 MED ORDER — SODIUM CHLORIDE 0.9 % IV SOLN: INTRAVENOUS | Status: AC

## 2024-08-29 MED ORDER — DIPHENHYDRAMINE HCL 25 MG PO CAPS: 25.0000 mg | ORAL_CAPSULE | Freq: Every day | ORAL | Status: DC | PRN

## 2024-08-29 MED ORDER — ACETAMINOPHEN 325 MG PO TABS: 650.0000 mg | ORAL_TABLET | Freq: Four times a day (QID) | ORAL | Status: DC | PRN

## 2024-08-29 MED ORDER — HYDRALAZINE HCL 20 MG/ML IJ SOLN: 10.0000 mg | Freq: Four times a day (QID) | INTRAMUSCULAR | Status: DC | PRN

## 2024-08-29 MED ORDER — HYDROCODONE-ACETAMINOPHEN 5-325 MG PO TABS: 1.0000 | ORAL_TABLET | ORAL | Status: DC | PRN

## 2024-08-29 MED ORDER — IOHEXOL 350 MG/ML SOLN
100.0000 mL | Freq: Once | INTRAVENOUS | Status: AC | PRN
  Administered 2024-08-29: 100 mL via INTRAVENOUS

## 2024-08-29 MED ORDER — PANTOPRAZOLE SODIUM 40 MG IV SOLR
40.0000 mg | Freq: Two times a day (BID) | INTRAVENOUS | Status: DC
  Administered 2024-08-29 – 2024-09-02 (×8): 40 mg via INTRAVENOUS
  Filled 2024-08-29 (×8): qty 10

## 2024-08-29 MED ORDER — AMLODIPINE BESYLATE 10 MG PO TABS: 10.0000 mg | ORAL_TABLET | Freq: Every day | ORAL | Status: DC

## 2024-08-29 MED ORDER — ONDANSETRON HCL 4 MG/2ML IJ SOLN: 4.0000 mg | Freq: Four times a day (QID) | INTRAMUSCULAR | Status: DC | PRN

## 2024-08-29 MED ORDER — ACETAMINOPHEN 650 MG RE SUPP: 650.0000 mg | Freq: Four times a day (QID) | RECTAL | Status: DC | PRN

## 2024-08-29 MED ORDER — ONDANSETRON HCL 4 MG PO TABS: 4.0000 mg | ORAL_TABLET | Freq: Four times a day (QID) | ORAL | Status: DC | PRN

## 2024-08-29 MED ORDER — FENTANYL CITRATE (PF) 50 MCG/ML IJ SOSY
25.0000 ug | PREFILLED_SYRINGE | Freq: Once | INTRAMUSCULAR | Status: AC
  Administered 2024-08-29: 25 ug via INTRAVENOUS
  Filled 2024-08-29: qty 1

## 2024-08-29 MED ORDER — FINASTERIDE 5 MG PO TABS
5.0000 mg | ORAL_TABLET | Freq: Every day | ORAL | Status: DC
  Administered 2024-08-29 – 2024-09-01 (×4): 5 mg via ORAL
  Filled 2024-08-29 (×4): qty 1

## 2024-08-29 NOTE — Assessment & Plan Note (Signed)
 -  Resume home Proscar

## 2024-08-29 NOTE — ED Provider Notes (Signed)
 [place] EMERGENCY DEPARTMENT AT [facility] Provider Note   CSN: [id number] Arrival date & time: 08/29/24  1115     Patient presents with: Abdominal Pain and Rectal Bleeding   Ruben Rodriguez is a 78 y.o. male with history of CAD and PCI of LAD on Plavix , hyperlipidemia, pretension, history of duodenal abscess, presents with concern for generalized abdominal discomfort that has been ongoing for the past 2 days.  He reports this does not seem to get better or worse with anything particular.  No associated nausea, vomiting, fever, or chills.  He reports a bowel movement this morning which did have bright red blood.  Denies any active rectal bleeding.  He does report a remote history of hemorrhoids, but none recently.  Denies any chest pain or shortness of breath.  Patient's last dose of Plavix  was this morning at 6 AM.    Abdominal Pain Associated symptoms: hematochezia   Rectal Bleeding Associated symptoms: abdominal pain        Prior to Admission medications   Medication Sig Start Date End Date Taking? Authorizing Provider  acetaminophen  (TYLENOL ) 650 MG CR tablet Take 1,300 mg by mouth every 8 (eight) hours as needed for pain.   Yes [provider]  amLODipine  (NORVASC ) 10 MG tablet Take 10 mg by mouth daily.   Yes [provider]  bisoprolol -hydrochlorothiazide  (ZIAC ) 10-6.25 MG per tablet Take 1 tablet by mouth daily.   Yes [provider]  clopidogrel  (PLAVIX ) 75 MG tablet TAKE 1 TABLET BY MOUTH EVERY DAY 07/01/24  Yes Crenshaw, Redell GORMAN, MD  diphenhydrAMINE  (BENADRYL ) 25 MG tablet Take 25 mg by mouth daily as needed for itching.   Yes [provider]  finasteride  (PROSCAR ) 5 MG tablet Take 5 mg by mouth at bedtime.   Yes [provider]  fluocinonide ointment (LIDEX) 0.05 % Apply 1 Application topically daily as needed (dryness inside ears).   Yes [provider]  pantoprazole  (PROTONIX ) 40 MG tablet Take 1 tablet  (40 mg total) by mouth 2 (two) times daily. 06/02/23  Yes Vann, Jessica U, DO  Polyvinyl Alcohol-Povidone (REFRESH OP) Place 1 drop into both eyes daily as needed (dry eyes).   Yes [provider]  nitroGLYCERIN  (NITROSTAT ) 0.4 MG SL tablet Place 1 tablet (0.4 mg total) under the tongue every 5 (five) minutes as needed. 08/31/23   Henry Manuelita NOVAK, NP    Allergies: Fish allergy, Penicillins, Repatha  [evolocumab ], Atorvastatin, Ezetimibe, Zocor [simvastatin], Amoxicillin, and Crestor  [rosuvastatin  calcium ]    Review of Systems  Gastrointestinal:  Positive for abdominal pain and hematochezia.    Updated Vital Signs BP 138/84   Pulse (!) 51   Temp 98.5 F (36.9 C) (Oral)   Resp 16   Wt 95.3 kg   SpO2 98%   BMI 31.01 kg/m   Physical Exam Vitals and nursing note reviewed. Exam conducted with a chaperone present.  Constitutional:      General: He is not in acute distress.    Appearance: He is well-developed.  HENT:     Head: Normocephalic and atraumatic.  Eyes:     Conjunctiva/sclera: Conjunctivae normal.  Cardiovascular:     Rate and Rhythm: Normal rate and regular rhythm.     Heart sounds: No murmur heard. Pulmonary:     Effort: Pulmonary effort is normal. No respiratory distress.     Breath sounds: Normal breath sounds.  Abdominal:     General: There is distension.     Palpations: Abdomen is  soft.     Tenderness: There is no abdominal tenderness.  Genitourinary:    Comments: NT chaperone present for exam  Patient without any appreciable external or internal hemorrhoids. Moderate amount of bright red blood coming from rectum on rectal exam. No anal fissures. No melanotic appearing stool Musculoskeletal:        General: No swelling.     Cervical back: Neck supple.  Skin:    General: Skin is warm and dry.     Capillary Refill: Capillary refill takes less than 2 seconds.  Neurological:     Mental Status: He is alert.  Psychiatric:        Mood and Affect: Mood  normal.     (all labs ordered are listed, but only abnormal results are displayed) Labs Reviewed  COMPREHENSIVE METABOLIC PANEL WITH GFR - Abnormal; Notable for the following components:      Result Value   Glucose, Bld 107 (*)    Creatinine, Ser 1.33 (*)    Total Protein 6.2 (*)    Alkaline Phosphatase 35 (*)    GFR, Estimated 55 (*)    All other components within normal limits  OCCULT BLOOD X 1 CARD TO LAB, STOOL - Abnormal; Notable for the following components:   Fecal Occult Bld POSITIVE (*)    All other components within normal limits  CBC WITH DIFFERENTIAL/PLATELET  LIPASE, BLOOD  TROPONIN T, HIGH SENSITIVITY  TROPONIN T, HIGH SENSITIVITY    EKG: EKG Interpretation Date/Time:  Thursday August 29 2024 13:38:45 EST Ventricular Rate:  49 PR Interval:  191 QRS Duration:  98 QT Interval:  459 QTC Calculation: 415 R Axis:   49  Text Interpretation: Sinus bradycardia Confirmed by Yolande Charleston [phone]) on 08/29/2024 3:28:15 PM  Radiology: CT ANGIO GI BLEED Result Date: 08/29/2024 CLINICAL DATA:  Blood in stool.  Abdominal pain. EXAM: CTA ABDOMEN AND PELVIS WITHOUT AND WITH CONTRAST TECHNIQUE: Multidetector CT imaging of the abdomen and pelvis was performed using the standard protocol during bolus administration of intravenous contrast. Multiplanar reconstructed images and MIPs were obtained and reviewed to evaluate the vascular anatomy. RADIATION DOSE REDUCTION: This exam was performed according to the departmental dose-optimization program which includes automated exposure control, adjustment of the mA and/or kV according to patient size and/or use of iterative reconstruction technique. CONTRAST:  OMNIPAQUE  IOHEXOL  350 MG/ML SOLN COMPARISON:  CT 05/29/2023 FINDINGS: VASCULAR Aorta: Normal caliber aorta without aneurysm, dissection, vasculitis or significant stenosis. Moderate atherosclerosis. Celiac: Patent without evidence of aneurysm, dissection, vasculitis or  significant stenosis. SMA: Plaque at the origin of the SMA causes less than 50% luminal stenosis. No aneurysm, dissection, or vasculitis. Renals: Both renal arteries are patent without evidence of aneurysm, dissection, vasculitis, fibromuscular dysplasia or significant stenosis. IMA: Patent without evidence of aneurysm, dissection, vasculitis or significant stenosis. Inflow: Patent without evidence of aneurysm, dissection, vasculitis or significant stenosis. Proximal Outflow: Bilateral common femoral and visualized portions of the superficial and profunda femoral arteries are patent without evidence of aneurysm, dissection, vasculitis or significant stenosis. Veins: Venous phase imaging demonstrates patency of the IVC and iliac veins. Portal, splenic, and mesenteric veins are patent. No portal venous or mesenteric gas. Review of the MIP images confirms the above findings. NON-VASCULAR Lower chest: The heart is mildly enlarged. No basilar airspace disease or pleural effusion. Hepatobiliary: Stable cyst in the left lobe of the liver. No further follow-up imaging is recommended. No suspicious liver lesion. Noncalcified gallstone in the gallbladder. No pericholecystic inflammation. No biliary dilatation. Pancreas:  No ductal dilatation or inflammation. Spleen: Normal in size without focal abnormality. Adrenals/Urinary Tract: No adrenal nodule. No hydronephrosis. No evidence of renal inflammation. Bilateral renal cysts. No further follow-up imaging is recommended. The urinary bladder is prominently distended. Bladder dome diverticula again seen. No bladder wall thickening. Stomach/Bowel: Findings suspicious for active bleed with contrast accumulation within redundant sigmoid colon, series 5, image 119. This increases on venous phase imaging, series 11 images 47 through 52. There are multiple colonic diverticula in this region as well as throughout the colon, but no discretely inflamed diverticula. Moderate volume of  colonic stool in the proximal colon. No small bowel obstruction or inflammation. Areas of hyperdensity within the mid small bowel are present on precontrast exam and consistent with ingested material. The stomach is nondistended. Lymphatic: No lymphadenopathy. Reproductive: Enlarged prostate gland causes mass effect on the bladder base. Other: No free air or ascites. No abdominopelvic collection. Small fat containing bilateral inguinal hernias, left greater than right. Small fat containing umbilical hernia. Musculoskeletal: Postsurgical change L3 through L5. Intact hardware. 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 There are no acute or suspicious osseous abnormalities. IMPRESSION: VASCULAR 1. Findings suspicious for active bleed within redundant sigmoid colon. 2.  Aortic Atherosclerosis (ICD10-I70.0). NON-VASCULAR 1. Diffuse colonic diverticulosis without focal diverticulitis. 2. Gallstone without cholecystitis. 3. Enlarged prostate gland causing mass effect on the bladder base. Prominently distended urinary bladder with bladder diverticula, query chronic bladder outlet obstruction. Electronically Signed   By: Andrea Gasman M.D.   On: 08/29/2024 15:25     Procedures   Medications Ordered in the ED  iohexol  (OMNIPAQUE ) 350 MG/ML injection 100 mL (100 mLs Intravenous Contrast Given 08/29/24 1410)  fentaNYL  (SUBLIMAZE ) injection 25 mcg (25 mcg Intravenous Given 08/29/24 1707)    Clinical Course as of 08/29/24 1708  Thu Aug 29, 2024  1529 VASCULAR  1. Findings suspicious for active bleed within redundant sigmoid colon. 2.  Aortic Atherosclerosis (ICD10-I70.0).  NON-VASCULAR  1. Diffuse colonic diverticulosis without focal diverticulitis. 2. Gallstone without cholecystitis. 3. Enlarged prostate gland causing mass effect on the bladder base. Prominently distended urinary bladder with bladder diverticula, query chronic bladder outlet obstruction.   [AF]  1542 Dr. Saintclair with GI  reccomends IR consult and admission to [facility].  She does not recommend reversal of his Plavix  at this time given patient hemodynamically stable [AF]  1558 Dr. Jennefer with IR recommends transfer to [facility] and will see patient in consult.  He request that he be notified once patient arrives to [facility] so that they can see the patient. [AF]  1601 Patient's bladder scan was over 1000 mL after urinating.  Will place Foley catheter for acute urinary retention. [AF]  8380 Consulted with hospitalist Dr. Fausto who recommends admission to [facility].  She will perform a virtual admission for the patient. [AF]    Clinical Course User Index [AF] Veta Palma, PA-C                                 Medical Decision Making Amount and/or Complexity of Data Reviewed Labs: ordered. Radiology: ordered.  Risk Prescription drug management. Decision regarding hospitalization.     Differential diagnosis includes but is not limited to GI bleed, hemorrhoid, acute cholecystitis, cholelithiasis, cholangitis, choledocholithiasis, peptic ulcer, gastritis, gastroenteritis, appendicitis, IBS, IBD, DKA, nephrolithiasis, UTI, pyelonephritis, pancreatitis, diverticulitis, mesenteric ischemia, abdominal aortic aneurysm, small bowel obstruction, volvulus   ED Course:  Upon  initial evaluation, patient is well-appearing, no acute distress.  Normal vital signs.  Abdomen is slightly distended, but soft and nontender to palpation.  With NT chaperone present, rectal exam was performed.  No appreciable external or internal hemorrhoids noted.  On stool sample obtained, there was noticeable bright red blood present and moderate active bleeding.  No melanotic appearing stool.  Will obtain CBC, lipase, and CMP.  Will also obtain troponin to evaluate for atypical ACS given patient's cardiac history.  Lower concern for ACS at this time given no chest pain, shortness of breath.  Labs Ordered: I Ordered, and  personally interpreted labs.  The pertinent results include:   CBC within normal limits CMP with elevated creatinine 1.33, GFR of 55.  Normal LFTs.  Normal electrolytes Lipase within normal limits troponin Troponin within normal limits Hemoccult positive  Imaging Studies ordered: I ordered imaging studies including CTA abdomen with GI bleed protocol I independently visualized the imaging with scope of interpretation limited to determining acute life threatening conditions related to emergency care. Imaging showed  IMPRESSION:  VASCULAR    1. Findings suspicious for active bleed within redundant sigmoid  colon.  2.  Aortic Atherosclerosis (ICD10-I70.0).    NON-VASCULAR    1. Diffuse colonic diverticulosis without focal diverticulitis.  2. Gallstone without cholecystitis.  3. Enlarged prostate gland causing mass effect on the bladder base.  Prominently distended urinary bladder with bladder diverticula,  query chronic bladder outlet obstruction.   I agree with the radiologist interpretation    Consultations Obtained: I requested consultation with the Gastroenterologist Dr. Saintclair,  and discussed lab and imaging findings as well as pertinent plan - they recommend: admission to [facility] and they will see patient in consult. She asked that I consult IR for treatment of patient's GI bleed I requested consultation with the IR Dr. Jennefer,  and discussed lab and imaging findings as well as pertinent plan - they recommend: Admission to [facility] and he will see patient in consult I requested consultation with the Dr. Fausto,  and discussed lab and imaging findings as well as pertinent plan - they recommend: admission to [facility]  Medications Given: Fentanyl    Upon re-evaluation, patient remains hemodynamically stable.  Patient's CT of the abdomen showed concern for an active GI bleed coming from the sigmoid colon.  I consulted with gastroenterology who will see patient in consult  once patient gets to [facility].  GI recommended a consult with IR. IR was also consulted, and they will see patient upon arrival to [facility].  Patient's hemoglobin is at 13.2, remains stable at this time. No hypotension or tachycardia. Patient is on Plavix  and did take his last dose at 6 AM this morning.  Given his hemodynamic stability, gastroenterology did not recommend any reversal at this time.  Patient CT scan also showed a distended bladder.  A bladder scan was performed after patient had voided, and he remained with over 1000 mL of urine in the bladder.  This is concerning for acute urinary tension and Foley catheter was placed with over 1500 mL of urine output.  Suspect this could have been contributing to his abdominal discomfort over the past 2 days.  Patient's initial and repeat troponin within normal limits.  No chest pain or shortness of breath.  Doubt his abdominal pain is atypical presentation of ACS.    Impression: Acute GI bleed Acute urinary retention  Disposition:  Admission to [facility] with hospitalist Dr. Fausto, and [facility] and  interventional radiology to consult.    Record Review: External records from outside source obtained and reviewed including previous GI notes     This chart was dictated using voice recognition software, Dragon. Despite the best efforts of this provider to proofread and correct errors, errors may still occur which can change documentation meaning.       Final diagnoses:  Acute GI bleeding  Acute urinary retention    ED Discharge Orders     None          Veta Palma, DEVONNA 08/29/24 1708    Yolande Lamar BROCKS, MD 08/30/24 2007

## 2024-08-29 NOTE — ED Triage Notes (Signed)
 Pt endorses rectal bleeding today, endorses abd pain x 2 days pta.

## 2024-08-29 NOTE — Plan of Care (Signed)
 In-person evaluation requested for this patient who arrives as transfer from [place] [facility], initially seen as a telemedicine admission by Dr. Fausto.  Patient is a 78 year old male with history significant for CAD s/p DES to LAD 08/31/2023 on Plavix , chronic sinus bradycardia, HTN, HLD, BPH who presented to the ED for evaluation of 2 days of abdominal pain with new bright red blood per rectum this morning.  Vitals notable for borderline sinus bradycardia which is at patient's baseline otherwise hemodynamically stable.  Hemoglobin was 13.2.  CT angio GI bleed study with findings suspicious for active bleed within redundant sigmoid colon.  Diffuse colonic diverticulosis without diverticulitis and gallstones without cholecystitis also noted.  Patient was noted to have urinary retention, >1000 mLs of urine on bladder scan.  Foley catheter placed in the ED.  EDP discussed with [facility] Dr. Saintclair who recommended IR consult and admission to [facility].  EDP spoke with Dr. Jennefer who recommended he be notified on arrival to [facility].  The hospitalist service was consulted for admission.  Patient seen at bedside.  He and family are frustrated as he was under the impression that he was going to have an urgent IR procedure tonight, but now has been told otherwise.  Patient states he had a second episode of large-volume bright red blood per rectum while waiting at the ED.  He has had some lightheadedness but no syncope.  Denies chest pain, dyspnea.  Last dose of Plavix  was this morning.  General: resting in bed HEENT: EOMI, no scleral icterus Cardiac: RRR, no rubs, murmurs or gallops Pulm: clear to auscultation bilaterally, moving normal volumes of air Abd: soft, mild epigastric tenderness, nondistended Ext: warm and well perfused, no pedal edema Neuro: alert and oriented X3  Recommendations: - Remain n.p.o., continue gentle IV fluid hydration - Follow stat CBC, obtain type and screen - Hold  Plavix  and antihypertensives - IR Dr. Jennefer and GI Dr. Saintclair to consult - Further recommendations as per Dr. Drew H&P

## 2024-08-29 NOTE — Assessment & Plan Note (Signed)
 Per med history does not appear to be on statin

## 2024-08-29 NOTE — Assessment & Plan Note (Signed)
 Foley placed in the ED with over 1 L urine output --Consider voiding trial prior to discharge versus outpatient urology follow-up

## 2024-08-29 NOTE — ED Notes (Signed)
 [facility] transported pt.

## 2024-08-29 NOTE — Assessment & Plan Note (Addendum)
 Patient presented for evaluation of 2 episodes of bright red blood per rectum today.  Patient reported abdominal discomfort but not overt pain over the past few days.  CTA bleed scan in the ED was positive for active bleed in the sigmoid colon.  Patient's hemoglobin normal in the ED at 13.2.  Hemodynamically stable. Patient notes prior history of a duodenal ulcer but has not had any abdominal pain similar to what he had at that time.  He reports his cardiac stent was 12 months ago this week and had recently followed up with cardiology.  Remains on Plavix . -- Trend hemoglobin closely --Transfuse PRBCs if hemoglobin less than 7 or less than 8 with active bleeding --Type & screen ordered --GI (Dr. Saintclair) and IR (Dr. Jennefer) were consulted in the ED  --NPO for now --Monitor hemodynamics closely --Hold Plavix  and DVT prophylaxis --SCDs --IV PPI twice daily empirically

## 2024-08-29 NOTE — H&P (Addendum)
 Telemedicine History and Physical    Patient: Ruben Rodriguez FMW:[id number] DOB: 05-08-1946 DOA: 08/29/2024 DOS: the patient was seen and examined on 08/29/2024 PCP: Loreli Kins, MD   Referring Provider: Alan Harari, PA-C Telemedicine Provider: Burnard Cunning, DO Patient Location: [facility] ED Referring Diagnosis: GI bleeding Patient Name and DOB verified: Ruben Rodriguez, 1946-07-14 Patient consented to Telemedicine Evaluation: Yes RN virtual assistant: Corean Cleveland, RN Video encounter time and date: 08/29/2024 4:55 pm   Patient coming from: Home  Chief Complaint:  Chief Complaint  Patient presents with   Abdominal Pain   Rectal Bleeding   HPI: Ruben Rodriguez is a 78 y.o. male with medical history significant of history of CAD and PCI of LAD on Plavix , hyperlipidemia, pretension, history of duodenal abscess who presented to [facility] ED today for evaluation of rectal bleeding and diffuse abdominal pain x 2 days. Pt reports around Friday after thanksgiving having some abdominal discomfort he describes as rumbling, not really painful. Has a small BM Saturday, felt little better.  Reports as he was getting ready for a meeting this morning, went to bathroom and states he gushed bright red blood, that was at 10:15 this AM.  [facility] to ED right away.  In the ED, went to bathroom to urinate, started to hemorrhage from his bottom, it was a lot of blood, reports it was a scary episode.  No history of prior similar bleeding.  He reports that he felt lightheaded with episodes of bleeding but not like he was passing out.  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 Found to have an ulcer then.  One year ago labor day, came in for severe stomach pain and was found to have a duodenal ulcer, was admitted at [facility] 5 days.  No recurrence since, just followed up GI.  No pain this time.  He denies other recent illness or  symptoms including fever/chills, cough, sore throat, congestion, nausea/vomiting, diarrhea.  ED course --  Initial vitals temp 29F, HR 56, RR 17, BP 124/79, SpO2 95% on room air. Labs obtained included CBC and CMP that were notable for creatinine 1.33, nonfasting glucose 107, normal hemoglobin 13.2.  Positive FOBT.  CT angio GI bleed scan showed active bleed in the sigmoid colon in addition to diffuse colonic diverticulosis without signs of diverticulitis, gallstones without cholecystitis.  Patient is being admitted to [facility] for further evaluation and management as outlined in detail below.  EDP spoke with Dr. Saintclair of gastroenterology and Dr. Jennefer of interventional radiology, they are both consulting.   Review of Systems: As mentioned in the history of present illness. All other systems reviewed and are negative.    Past Medical History:  Diagnosis Date   Arthritis    BPH (benign prostatic hyperplasia)    Chronic back pain    Family history of adverse reaction to anesthesia    father died during carotid artery surgery   GERD (gastroesophageal reflux disease)    Hyperlipidemia    Hypertension    Pneumonia    Spinal stenosis    Past Surgical History:  Procedure Laterality Date   BIOPSY  05/31/2023   Procedure: BIOPSY;  Surgeon: Dianna Specking, MD;  Location: [facility] ENDOSCOPY;  Service: Gastroenterology;;   COLONOSCOPY     CORONARY PRESSURE/FFR WITH 3D MAPPING N/A 08/31/2023   Procedure: Coronary Pressure/FFR w/3D Mapping;  Surgeon: Wonda Sharper, MD;  Location: [facility] INVASIVE CV LAB;  Service: Cardiovascular;  Laterality: N/A;   CORONARY STENT INTERVENTION N/A 08/31/2023   Procedure: CORONARY STENT INTERVENTION;  Surgeon: Wonda Sharper, MD;  Location: [facility] INVASIVE CV LAB;  Service: Cardiovascular;  Laterality: N/A;   ESOPHAGOGASTRODUODENOSCOPY (EGD) WITH PROPOFOL  N/A 05/31/2023   Procedure: ESOPHAGOGASTRODUODENOSCOPY (EGD) WITH PROPOFOL ;  Surgeon: Dianna Specking, MD;   Location: [facility] ENDOSCOPY;  Service: Gastroenterology;  Laterality: N/A;   FINGER SURGERY     to remove a tumor from right little finger   LAMINECTOMY WITH POSTERIOR LATERAL ARTHRODESIS LEVEL 2 N/A 08/01/2018   Procedure: LUMBAR THREE- LUMBAR FOUR, LUMBAR FOUR-LUMBAR FIVE DECOMPRESSION, LUMBAR THREE-LUMBAR FOUR, LUMBAR FOUR-LUMBAR FIVE POSTEROLATERAL ARTHRODESIS;  Surgeon: Alix Charleston, MD;  Location: [facility] OR;  Service: Neurosurgery;  Laterality: N/A;   LEFT HEART CATH AND CORONARY ANGIOGRAPHY N/A 08/27/2021   Procedure: LEFT HEART CATH AND CORONARY ANGIOGRAPHY;  Surgeon: Wonda Sharper, MD;  Location: [facility] INVASIVE CV LAB;  Service: Cardiovascular;  Laterality: N/A;   LEFT HEART CATH AND CORONARY ANGIOGRAPHY N/A 08/31/2023   Procedure: LEFT HEART CATH AND CORONARY ANGIOGRAPHY;  Surgeon: Wonda Sharper, MD;  Location: [facility] INVASIVE CV LAB;  Service: Cardiovascular;  Laterality: N/A;   Social History:  reports that he has quit smoking. He has never used smokeless tobacco. He reports that he does not drink alcohol and does not use drugs.  Allergies  Allergen Reactions   Fish Allergy Anaphylaxis and Rash    Tolerates salmon and tuna   Penicillins Anaphylaxis   Repatha  [Evolocumab ] Shortness Of Breath and Rash    Pain, vision problems    Atorvastatin Other (See Comments)    Muscle aches   Ezetimibe Other (See Comments)    Muscle aches   Zocor [Simvastatin] Other (See Comments)    Muscle aches   Amoxicillin Other (See Comments)    MD advised patient to not take this, either   Crestor  [Rosuvastatin  Calcium ] Other (See Comments)    Joint and muscle pain    Family History  Problem Relation Age of Onset   Hypertension Mother     Prior to Admission medications   Medication Sig Start Date End Date Taking? Authorizing Provider  acetaminophen  (TYLENOL ) 650 MG CR tablet Take 1,300 mg by mouth every 8 (eight) hours as needed for pain.    [provider]  amLODipine  (NORVASC ) 10 MG tablet  Take 10 mg by mouth daily.    [provider]  bisoprolol -hydrochlorothiazide  (ZIAC ) 10-6.25 MG per tablet Take 1 tablet by mouth daily.    [provider]  clopidogrel  (PLAVIX ) 75 MG tablet TAKE 1 TABLET BY MOUTH EVERY DAY 07/01/24   Pietro Redell RAMAN, MD  diphenhydrAMINE  (BENADRYL ) 25 MG tablet Take 25 mg by mouth daily as needed for itching.    [provider]  finasteride  (PROSCAR ) 5 MG tablet Take 5 mg by mouth at bedtime.    [provider]  fluocinonide ointment (LIDEX) 0.05 % Apply 1 Application topically daily as needed (dryness inside ears).    [provider]  nitroGLYCERIN  (NITROSTAT ) 0.4 MG SL tablet Place 1 tablet (0.4 mg total) under the tongue every 5 (five) minutes as needed. 08/31/23   Henry Manuelita NOVAK, NP  pantoprazole  (PROTONIX ) 40 MG tablet Take 1 tablet (40 mg total) by mouth 2 (two) times daily. 06/02/23   Vann, Jessica U, DO  Polyvinyl Alcohol-Povidone (REFRESH OP) Place 1 drop into both eyes daily as needed (dry eyes).    [provider]    Physical Exam: Vitals:   08/29/24  1340 08/29/24 1607 08/29/24 1700 08/29/24 1745  BP: 138/84  (!) 159/84 (!) 143/89  Pulse: (!) 51  (!) 49 (!) 50  Resp: 16  16 17   Temp:  98.5 F (36.9 C)  98.3 F (36.8 C)  TempSrc:  Oral  Oral  SpO2: 98%  97% 100%  Weight:       Bedside physical exam was performed by RN listed above. Below exam findings are based on their in person physical exam findings and my observations during virtual encounter.  General exam: awake, alert, no acute distress HEENT: hearing grossly normal  Respiratory system: CTAB, no wheezes, rales or rhonchi, normal respiratory effort. Cardiovascular system: normal S1/S2, RRR, no JVD, murmurs, rubs, gallops,  no pedal edema.   Gastrointestinal system: soft, NT, ND, no HSM felt, +bowel sounds. Central nervous system: A&O x 3. no gross focal neurologic deficits, normal speech Extremities: moves all, no  edema Psychiatry: normal mood, congruent affect, judgement and insight appear normal   Data Reviewed:  Labs and diagnostic studiesAs reviewed in detail above  Assessment and Plan:  * Acute GI bleeding Patient presented for evaluation of 2 episodes of bright red blood per rectum today.  Patient reported abdominal discomfort but not overt pain over the past few days.  CTA bleed scan in the ED was positive for active bleed in the sigmoid colon.  Patient's hemoglobin normal in the ED at 13.2.  Hemodynamically stable. Patient notes prior history of a duodenal ulcer but has not had any abdominal pain similar to what he had at that time.  He reports his cardiac stent was 12 months ago this week and had recently followed up with cardiology.  Remains on Plavix . -- Trend hemoglobin closely --Transfuse PRBCs if hemoglobin less than 7 or less than 8 with active bleeding --Type & screen ordered --GI (Dr. Saintclair) and IR (Dr. Jennefer) were consulted in the ED  --NPO for now --Monitor hemodynamics closely --Hold Plavix  and DVT prophylaxis --SCDs --IV PPI twice daily empirically  Acute urinary retention Foley placed in the ED with over 1 L urine output --Consider voiding trial prior to discharge versus outpatient urology follow-up  Essential hypertension BP stable on admission, later mildly elevated.   --Hold home antihypertensives given GI bleeding and risk for hypotension --Titrate regimen --As needed hydralazine  for now  Coronary artery disease Stable, no active chest pain.  Patient reports his cardiac stent was 12 months ago this week and he had recently followed up with cardiologist --Hold Plavix  --Check EKG and troponin if chest pain  Hyperlipidemia Per med history does not appear to be on statin  BPH (benign prostatic hyperplasia) Resume home Proscar       Advance Care Planning: CODE STATUS: Full code  Consults:  GI - Dr. Saintclair IR - Dr. Jennefer  Family Communication: None  present during virtual admission encounter.  Patient updated in detail and agreeable with the plan  Severity of Illness: The appropriate patient status for this patient is OBSERVATION. Observation status is judged to be reasonable and necessary in order to provide the required intensity of service to ensure the patient's safety. The patient's presenting symptoms, physical exam findings, and initial radiographic and laboratory data in the context of their medical condition is felt to place them at decreased risk for further clinical deterioration. Furthermore, it is anticipated that the patient will be medically stable for discharge from the hospital within 2 midnights of admission.   Author: Burnard DELENA Cunning, DO 08/29/2024 7:18 PM  For  on call review [email or web address].

## 2024-08-29 NOTE — ED Notes (Signed)
 Pt had a large bowel movement of blood when trying to urinate in bathroom. MD aware.

## 2024-08-29 NOTE — Assessment & Plan Note (Signed)
 BP stable on admission, later mildly elevated.   --Hold home antihypertensives given GI bleeding and risk for hypotension --Titrate regimen --As needed hydralazine  for now

## 2024-08-29 NOTE — Assessment & Plan Note (Signed)
 Stable, no active chest pain.  Patient reports his cardiac stent was 12 months ago this week and he had recently followed up with cardiologist --Hold Plavix  --Check EKG and troponin if chest pain

## 2024-08-29 NOTE — ED Notes (Signed)
Spoke with lab to add on trop 

## 2024-08-29 NOTE — Consult Note (Signed)
 Chief Complaint: Patient was seen in consultation today for lower gastrointestinal hemorrhage  Referring Physician(s): Jorie Blanch, MD  Patient Status: [facility] - In-pt  History of Present Illness: Ruben Rodriguez is a 78 y.o. male with history of coronary artery disease status post coronary stent placement in 2024 on Plavix  who presented to [facility] ED today with bright red blood per rectum.  He has never had any episodes like this in the past.  No recent GI illness or abdominal pain.  Since transfer from [facility] to [facility] he has had one additional bowel movement which was blood tinged.  He has had no loss of consciousness or hypotension.  Past Medical History:  Diagnosis Date   Arthritis    BPH (benign prostatic hyperplasia)    Chronic back pain    Family history of adverse reaction to anesthesia    father died during carotid artery surgery   GERD (gastroesophageal reflux disease)    Hyperlipidemia    Hypertension    Pneumonia    Spinal stenosis     Past Surgical History:  Procedure Laterality Date   BIOPSY  05/31/2023   Procedure: BIOPSY;  Surgeon: Dianna Specking, MD;  Location: [facility] ENDOSCOPY;  Service: Gastroenterology;;   COLONOSCOPY     CORONARY PRESSURE/FFR WITH 3D MAPPING N/A 08/31/2023   Procedure: Coronary Pressure/FFR w/3D Mapping;  Surgeon: Wonda Sharper, MD;  Location: [facility] INVASIVE CV LAB;  Service: Cardiovascular;  Laterality: N/A;   CORONARY STENT INTERVENTION N/A 08/31/2023   Procedure: CORONARY STENT INTERVENTION;  Surgeon: Wonda Sharper, MD;  Location: [facility] INVASIVE CV LAB;  Service: Cardiovascular;  Laterality: N/A;   ESOPHAGOGASTRODUODENOSCOPY (EGD) WITH PROPOFOL  N/A 05/31/2023   Procedure: ESOPHAGOGASTRODUODENOSCOPY (EGD) WITH PROPOFOL ;  Surgeon: Dianna Specking, MD;  Location: [facility] ENDOSCOPY;  Service: Gastroenterology;  Laterality: N/A;   FINGER SURGERY     to remove a tumor from right little finger   LAMINECTOMY WITH POSTERIOR LATERAL ARTHRODESIS LEVEL 2  N/A 08/01/2018   Procedure: LUMBAR THREE- LUMBAR FOUR, LUMBAR FOUR-LUMBAR FIVE DECOMPRESSION, LUMBAR THREE-LUMBAR FOUR, LUMBAR FOUR-LUMBAR FIVE POSTEROLATERAL ARTHRODESIS;  Surgeon: Alix Charleston, MD;  Location: [facility] OR;  Service: Neurosurgery;  Laterality: N/A;   LEFT HEART CATH AND CORONARY ANGIOGRAPHY N/A 08/27/2021   Procedure: LEFT HEART CATH AND CORONARY ANGIOGRAPHY;  Surgeon: Wonda Sharper, MD;  Location: [facility] INVASIVE CV LAB;  Service: Cardiovascular;  Laterality: N/A;   LEFT HEART CATH AND CORONARY ANGIOGRAPHY N/A 08/31/2023   Procedure: LEFT HEART CATH AND CORONARY ANGIOGRAPHY;  Surgeon: Wonda Sharper, MD;  Location: [facility] INVASIVE CV LAB;  Service: Cardiovascular;  Laterality: N/A;    Allergies: Fish allergy, Penicillins, Repatha  [evolocumab ], Atorvastatin, Ezetimibe, Zocor [simvastatin], Amoxicillin, and Crestor  [rosuvastatin  calcium ]  Medications: Prior to Admission medications   Medication Sig Start Date End Date Taking? Authorizing Provider  acetaminophen  (TYLENOL ) 650 MG CR tablet Take 1,300 mg by mouth every 8 (eight) hours as needed for pain.   Yes [provider]  amLODipine  (NORVASC ) 10 MG tablet Take 10 mg by mouth daily.   Yes [provider]  bisoprolol -hydrochlorothiazide  (ZIAC ) 10-6.25 MG per tablet Take 1 tablet by mouth daily.   Yes [provider]  clopidogrel  (PLAVIX ) 75 MG tablet TAKE 1 TABLET BY MOUTH EVERY DAY 07/01/24  Yes Crenshaw, Redell GORMAN, MD  diphenhydrAMINE  (BENADRYL ) 25 MG tablet Take 25 mg by mouth daily as needed for itching.   Yes [provider]  finasteride  (PROSCAR ) 5 MG tablet Take 5 mg by mouth at bedtime.   Yes [provider]  fluocinonide ointment (LIDEX) 0.05 % Apply 1 Application topically daily as needed (dryness inside ears).   Yes [provider]  pantoprazole  (PROTONIX ) 40 MG tablet Take 1 tablet (40 mg total) by mouth 2 (two) times daily. 06/02/23  Yes Vann, Jessica U, DO  Polyvinyl  Alcohol-Povidone (REFRESH OP) Place 1 drop into both eyes daily as needed (dry eyes).   Yes [provider]  nitroGLYCERIN  (NITROSTAT ) 0.4 MG SL tablet Place 1 tablet (0.4 mg total) under the tongue every 5 (five) minutes as needed. 08/31/23   Henry Manuelita NOVAK, NP     Family History  Problem Relation Age of Onset   Hypertension Mother     Social History   Socioeconomic History   Marital status: Married    Spouse name: Not on file   Number of children: 2   Years of education: Not on file   Highest education level: Not on file  Occupational History   Not on file  Tobacco Use   Smoking status: Former   Smokeless tobacco: Never  Vaping Use   Vaping status: Never Used  Substance and Sexual Activity   Alcohol use: No    Comment: Occasional   Drug use: No   Sexual activity: Not on file  Other Topics Concern   Not on file  Social History Narrative   Not on file   Social Drivers of Health   Financial Resource Strain: Not on file  Food Insecurity: Unknown (08/29/2024)   Hunger Vital Sign    Worried About Running Out of Food in the Last Year: Not on file    Ran Out of Food in the Last Year: Never true  Transportation Needs: No Transportation Needs (08/29/2024)   PRAPARE - Administrator, Civil Service (Medical): No    Lack of Transportation (Non-Medical): No  Physical Activity: Not on file  Stress: Not on file  Social Connections: Patient Declined (08/29/2024)   Social Connection and Isolation Panel    Frequency of Communication with Friends and Family: Patient declined    Frequency of Social Gatherings with Friends and Family: Patient declined    Attends Religious Services: Patient declined    Database Administrator or Organizations: Patient declined    Attends Banker Meetings: Patient declined    Marital Status: Patient declined    Review of Systems: A 12 point ROS discussed and pertinent positives are indicated in the HPI above.  All  other systems are negative.  Vital Signs: BP (!) 143/89 (BP Location: Left Arm)   Pulse (!) 50   Temp 98.3 F (36.8 C) (Oral)   Resp 17   Wt 95.3 kg   SpO2 100%   BMI 31.01 kg/m   Advance Care Plan: The advanced care plan/surrogate decision maker was discussed at the time of visit and documented in the medical record.    Physical Exam Constitutional:      General: He is not in acute distress. HENT:     Head: Normocephalic.     Mouth/Throat:     Mouth: Mucous membranes are moist.  Eyes:     General: No scleral icterus. Cardiovascular:     Rate and Rhythm: Normal rate and regular rhythm.  Pulmonary:     Effort: No respiratory distress.  Abdominal:     General: There is no distension.     Tenderness: There is no abdominal tenderness.  Musculoskeletal:     Right lower leg: No edema.  Skin:  General: Skin is warm and dry.     Coloration: Skin is not jaundiced.  Neurological:     Mental Status: He is alert and oriented to person, place, and time.     Imaging: CT ANGIO GI BLEED Result Date: 08/29/2024 CLINICAL DATA:  Blood in stool.  Abdominal pain. EXAM: CTA ABDOMEN AND PELVIS WITHOUT AND WITH CONTRAST TECHNIQUE: Multidetector CT imaging of the abdomen and pelvis was performed using the standard protocol during bolus administration of intravenous contrast. Multiplanar reconstructed images and MIPs were obtained and reviewed to evaluate the vascular anatomy. RADIATION DOSE REDUCTION: This exam was performed according to the departmental dose-optimization program which includes automated exposure control, adjustment of the mA and/or kV according to patient size and/or use of iterative reconstruction technique. CONTRAST:  OMNIPAQUE  IOHEXOL  350 MG/ML SOLN COMPARISON:  CT 05/29/2023 FINDINGS: VASCULAR Aorta: Normal caliber aorta without aneurysm, dissection, vasculitis or significant stenosis. Moderate atherosclerosis. Celiac: Patent without evidence of aneurysm, dissection,  vasculitis or significant stenosis. SMA: Plaque at the origin of the SMA causes less than 50% luminal stenosis. No aneurysm, dissection, or vasculitis. Renals: Both renal arteries are patent without evidence of aneurysm, dissection, vasculitis, fibromuscular dysplasia or significant stenosis. IMA: Patent without evidence of aneurysm, dissection, vasculitis or significant stenosis. Inflow: Patent without evidence of aneurysm, dissection, vasculitis or significant stenosis. Proximal Outflow: Bilateral common femoral and visualized portions of the superficial and profunda femoral arteries are patent without evidence of aneurysm, dissection, vasculitis or significant stenosis. Veins: Venous phase imaging demonstrates patency of the IVC and iliac veins. Portal, splenic, and mesenteric veins are patent. No portal venous or mesenteric gas. Review of the MIP images confirms the above findings. NON-VASCULAR Lower chest: The heart is mildly enlarged. No basilar airspace disease or pleural effusion. Hepatobiliary: Stable cyst in the left lobe of the liver. No further follow-up imaging is recommended. No suspicious liver lesion. Noncalcified gallstone in the gallbladder. No pericholecystic inflammation. No biliary dilatation. Pancreas: No ductal dilatation or inflammation. Spleen: Normal in size without focal abnormality. Adrenals/Urinary Tract: No adrenal nodule. No hydronephrosis. No evidence of renal inflammation. Bilateral renal cysts. No further follow-up imaging is recommended. The urinary bladder is prominently distended. Bladder dome diverticula again seen. No bladder wall thickening. Stomach/Bowel: Findings suspicious for active bleed with contrast accumulation within redundant sigmoid colon, series 5, image 119. This increases on venous phase imaging, series 11 images 47 through 52. There are multiple colonic diverticula in this region as well as throughout the colon, but no discretely inflamed diverticula. Moderate  volume of colonic stool in the proximal colon. No small bowel obstruction or inflammation. Areas of hyperdensity within the mid small bowel are present on precontrast exam and consistent with ingested material. The stomach is nondistended. Lymphatic: No lymphadenopathy. Reproductive: Enlarged prostate gland causes mass effect on the bladder base. Other: No free air or ascites. No abdominopelvic collection. Small fat containing bilateral inguinal hernias, left greater than right. Small fat containing umbilical hernia. Musculoskeletal: Postsurgical change L3 through L5. Intact hardware. 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 There are no acute or suspicious osseous abnormalities. IMPRESSION: VASCULAR 1. Findings suspicious for active bleed within redundant sigmoid colon. 2.  Aortic Atherosclerosis (ICD10-I70.0). NON-VASCULAR 1. Diffuse colonic diverticulosis without focal diverticulitis. 2. Gallstone without cholecystitis. 3. Enlarged prostate gland causing mass effect on the bladder base. Prominently distended urinary bladder with bladder diverticula, query chronic bladder outlet obstruction. Electronically Signed   By: Andrea Gasman M.D.   On: 08/29/2024 15:25  Labs:  CBC: Recent Labs    08/29/24 1255 08/29/24 1934  WBC 6.6 8.2  HGB 13.2 12.6*  HCT 40.9 40.3  PLT 157 168    COAGS: No results for input(s): INR, APTT in the last 8760 hours.  BMP: Recent Labs    08/29/24 1255  NA 140  K 4.8  CL 104  CO2 28  GLUCOSE 107*  BUN 21  CALCIUM  9.7  CREATININE 1.33*  GFRNONAA 55*    LIVER FUNCTION TESTS: Recent Labs    08/29/24 1255  BILITOT 0.5  AST 16  ALT 15  ALKPHOS 35*  PROT 6.2*  ALBUMIN 4.0    TUMOR MARKERS: No results for input(s): AFPTM, CEA, CA199, CHROMGRNA in the last 8760 hours.  Assessment and Plan: 78 year old male on Plavix  admitted to [facility] from [place] with lower gastrointestinal hemorrhage, active extravasation from the  sigmoid colon with neighboring diverticulosis.  Labs demonstrate minimal anemia and he has remained normotensive.  One single bowel movement which was blood tinged since arrival.   No indication for angiogram or embolization at this time given stability and lack of true hematochezia.  If this were to develop, please notify IR for possible angiogram.  This plan was discussed with the patient and his wife who are in agreement.    Electronically Signed: Ester JINNY Sides, MD 08/29/2024, 9:26 PM   I spent a total of 40 Minutes  in face to face in clinical consultation, greater than 50% of which was counseling/coordinating care for lower gastrointestinal hemorrhage.

## 2024-08-30 DIAGNOSIS — K922 Gastrointestinal hemorrhage, unspecified: Secondary | ICD-10-CM | POA: Diagnosis not present

## 2024-08-30 DIAGNOSIS — K5730 Diverticulosis of large intestine without perforation or abscess without bleeding: Secondary | ICD-10-CM | POA: Diagnosis not present

## 2024-08-30 LAB — BASIC METABOLIC PANEL WITH GFR
Anion gap: 8 (ref 5–15)
BUN: 29 mg/dL — ABNORMAL HIGH (ref 8–23)
CO2: 26 mmol/L (ref 22–32)
Calcium: 8.9 mg/dL (ref 8.9–10.3)
Chloride: 107 mmol/L (ref 98–111)
Creatinine, Ser: 1.18 mg/dL (ref 0.61–1.24)
GFR, Estimated: >60 mL/min (ref >60)
Glucose, Bld: 106 mg/dL — ABNORMAL HIGH (ref 70–99)
Potassium: 4.6 mmol/L (ref 3.5–5.1)
Sodium: 141 mmol/L (ref 135–145)

## 2024-08-30 LAB — CBC
HCT: 33.6 % — ABNORMAL LOW (ref 39.0–52.0)
Hemoglobin: 10.7 g/dL — ABNORMAL LOW (ref 13.0–17.0)
MCH: 26.8 pg (ref 26.0–34.0)
MCHC: 31.8 g/dL (ref 30.0–36.0)
MCV: 84 fL (ref 80.0–100.0)
Platelets: 119 K/uL — ABNORMAL LOW (ref 150–400)
RBC: 4 MIL/uL — ABNORMAL LOW (ref 4.22–5.81)
RDW: 14.6 % (ref 11.5–15.5)
WBC: 7.3 K/uL (ref 4.0–10.5)
nRBC: 0 % (ref 0.0–0.2)

## 2024-08-30 LAB — HEMOGLOBIN AND HEMATOCRIT, BLOOD
HCT: 28.6 % — ABNORMAL LOW (ref 39.0–52.0)
HCT: 31 % — ABNORMAL LOW (ref 39.0–52.0)
HCT: 32.6 % — ABNORMAL LOW (ref 39.0–52.0)
Hemoglobin: 10.4 g/dL — ABNORMAL LOW (ref 13.0–17.0)
Hemoglobin: 9.2 g/dL — ABNORMAL LOW (ref 13.0–17.0)
Hemoglobin: 9.6 g/dL — ABNORMAL LOW (ref 13.0–17.0)

## 2024-08-30 NOTE — Plan of Care (Signed)

## 2024-08-30 NOTE — Consult Note (Signed)
 [facility] Consult  Referring Provider: ER Primary Care Physician:  Loreli Kins, MD Primary Gastroenterologist: Dr. Rosalie  Reason for Consultation: Rectal bleeding  HPI: Ruben Rodriguez is a 78 y.o. male was in his usual state of health until yesterday morning when he had 1 episode of large-volume bright red blood per rectum around 11 AM.  This prompted him to proceed to [facility] where he had another episode of large-volume of rectal bleeding associated with blood clots and maroon stools. This was not associated with abdominal pain. He denies nausea, vomiting, acid reflux, heartburn, difficulty swallowing or pain on swallowing. Denies recent unintentional weight loss or loss of appetite. He was on Plavix , last dose was yesterday morning, 12//25 at 6 AM. Patient has intermittent constipation for which she takes stool softener as needed. Patient reports having another episode of rectal bleeding after he was transferred to [facility] long hospital last night. Since then he is afraid to move around but has not had any more bowel movements. He is has not required blood transfusions. He denies chest pain, shortness of breath, dizziness.  Previous GI workup: EGD 9/24, Dr. Dianna, epigastric pain, abnormal CAT scan: LA grade C esophagitis, medium size hiatal hernia, partially obstructing nonbleeding cratered duodenal ulcer, 30 mm, biopsy showed peptic duodenitis, H. pylori was not identified Colonoscopy, 5/21, Dr. Elicia, positive FIT, history of polyps: Pandiverticulosis, internal hemorrhoids, random colon biopsies unremarkable for microscopic colitis Colonoscopy, 2007, screening, [facility]: Tubular adenoma and hyperplastic polyps removed  Past Medical History:  Diagnosis Date   Arthritis    BPH (benign prostatic hyperplasia)    Chronic back pain    Family history of adverse reaction to anesthesia    father died during carotid artery surgery   GERD  (gastroesophageal reflux disease)    Hyperlipidemia    Hypertension    Pneumonia    Spinal stenosis     Past Surgical History:  Procedure Laterality Date   BIOPSY  05/31/2023   Procedure: BIOPSY;  Surgeon: Dianna Specking, MD;  Location: [facility] ENDOSCOPY;  Service: Gastroenterology;;   COLONOSCOPY     CORONARY PRESSURE/FFR WITH 3D MAPPING N/A 08/31/2023   Procedure: Coronary Pressure/FFR w/3D Mapping;  Surgeon: Wonda Sharper, MD;  Location: [facility] INVASIVE CV LAB;  Service: Cardiovascular;  Laterality: N/A;   CORONARY STENT INTERVENTION N/A 08/31/2023   Procedure: CORONARY STENT INTERVENTION;  Surgeon: Wonda Sharper, MD;  Location: [facility] INVASIVE CV LAB;  Service: Cardiovascular;  Laterality: N/A;   ESOPHAGOGASTRODUODENOSCOPY (EGD) WITH PROPOFOL  N/A 05/31/2023   Procedure: ESOPHAGOGASTRODUODENOSCOPY (EGD) WITH PROPOFOL ;  Surgeon: Dianna Specking, MD;  Location: [facility] ENDOSCOPY;  Service: Gastroenterology;  Laterality: N/A;   FINGER SURGERY     to remove a tumor from right little finger   LAMINECTOMY WITH POSTERIOR LATERAL ARTHRODESIS LEVEL 2 N/A 08/01/2018   Procedure: LUMBAR THREE- LUMBAR FOUR, LUMBAR FOUR-LUMBAR FIVE DECOMPRESSION, LUMBAR THREE-LUMBAR FOUR, LUMBAR FOUR-LUMBAR FIVE POSTEROLATERAL ARTHRODESIS;  Surgeon: Alix Charleston, MD;  Location: [facility] OR;  Service: Neurosurgery;  Laterality: N/A;   LEFT HEART CATH AND CORONARY ANGIOGRAPHY N/A 08/27/2021   Procedure: LEFT HEART CATH AND CORONARY ANGIOGRAPHY;  Surgeon: Wonda Sharper, MD;  Location: [facility] INVASIVE CV LAB;  Service: Cardiovascular;  Laterality: N/A;   LEFT HEART CATH AND CORONARY ANGIOGRAPHY N/A 08/31/2023   Procedure: LEFT HEART CATH AND CORONARY ANGIOGRAPHY;  Surgeon: Wonda Sharper, MD;  Location: [facility] INVASIVE CV LAB;  Service: Cardiovascular;  Laterality: N/A;    Prior to Admission medications   Medication Sig Start Date End  Date Taking? Authorizing Provider  acetaminophen  (TYLENOL ) 650 MG CR tablet Take 1,300 mg by mouth every 8  (eight) hours as needed for pain.   Yes [provider]  amLODipine  (NORVASC ) 10 MG tablet Take 10 mg by mouth daily.   Yes [provider]  bisoprolol -hydrochlorothiazide  (ZIAC ) 10-6.25 MG per tablet Take 1 tablet by mouth daily.   Yes [provider]  clopidogrel  (PLAVIX ) 75 MG tablet TAKE 1 TABLET BY MOUTH EVERY DAY 07/01/24  Yes Crenshaw, Redell RAMAN, MD  diphenhydrAMINE  (BENADRYL ) 25 MG tablet Take 25 mg by mouth daily as needed for itching.   Yes [provider]  finasteride  (PROSCAR ) 5 MG tablet Take 5 mg by mouth at bedtime.   Yes [provider]  fluocinonide ointment (LIDEX) 0.05 % Apply 1 Application topically daily as needed (dryness inside ears).   Yes [provider]  pantoprazole  (PROTONIX ) 40 MG tablet Take 1 tablet (40 mg total) by mouth 2 (two) times daily. 06/02/23  Yes Vann, Jessica U, DO  Polyvinyl Alcohol-Povidone (REFRESH OP) Place 1 drop into both eyes daily as needed (dry eyes).   Yes [provider]  nitroGLYCERIN  (NITROSTAT ) 0.4 MG SL tablet Place 1 tablet (0.4 mg total) under the tongue every 5 (five) minutes as needed. 08/31/23   Henry Manuelita NOVAK, NP    Current Facility-Administered Medications  Medication Dose Route Frequency Provider Last Rate Last Admin   0.9 %  sodium chloride  infusion   Intravenous Continuous Fausto Burnard LABOR, DO 75 mL/hr at 08/30/24 0649 New Bag at 08/30/24 0649   acetaminophen  (TYLENOL ) tablet 650 mg  650 mg Oral Q6H PRN Fausto Burnard A, DO       Or   acetaminophen  (TYLENOL ) suppository 650 mg  650 mg Rectal Q6H PRN Fausto Burnard A, DO       diphenhydrAMINE  (BENADRYL ) capsule 25 mg  25 mg Oral Daily PRN Fausto Burnard A, DO       finasteride  (PROSCAR ) tablet 5 mg  5 mg Oral QHS Fausto Burnard A, DO   5 mg at 08/29/24 2100   hydrALAZINE  (APRESOLINE ) injection 10 mg  10 mg Intravenous Q6H PRN Fausto Burnard LABOR, DO       HYDROcodone -acetaminophen  (NORCO/VICODIN) 5-325 MG per tablet  1-2 tablet  1-2 tablet Oral Q4H PRN Fausto Burnard A, DO       ondansetron  (ZOFRAN ) tablet 4 mg  4 mg Oral Q6H PRN Fausto Burnard A, DO       Or   ondansetron  (ZOFRAN ) injection 4 mg  4 mg Intravenous Q6H PRN Fausto Burnard A, DO       pantoprazole  (PROTONIX ) injection 40 mg  40 mg Intravenous Q12H Fausto Burnard A, DO   40 mg at 08/30/24 1001    Allergies as of 08/29/2024 - Review Complete 08/29/2024  Allergen Reaction Noted   Fish allergy Anaphylaxis and Rash 08/23/2023   Penicillins Anaphylaxis 04/14/2014   Repatha  [evolocumab ] Shortness Of Breath and Rash 08/23/2023   Atorvastatin Other (See Comments) 04/14/2014   Ezetimibe Other (See Comments) 04/14/2014   Zocor [simvastatin] Other (See Comments) 04/14/2014   Amoxicillin Other (See Comments) 05/30/2023   Crestor  [rosuvastatin  calcium ] Other (See Comments) 01/04/2022    Family History  Problem Relation Age of Onset   Hypertension Mother     Social History   Socioeconomic History   Marital status: Married    Spouse name: Not on file   Number of children: 2   Years of education: Not on file  Highest education level: Not on file  Occupational History   Not on file  Tobacco Use   Smoking status: Former   Smokeless tobacco: Never  Vaping Use   Vaping status: Never Used  Substance and Sexual Activity   Alcohol use: No    Comment: Occasional   Drug use: No   Sexual activity: Not on file  Other Topics Concern   Not on file  Social History Narrative   Not on file   Social Drivers of Health   Financial Resource Strain: Not on file  Food Insecurity: Unknown (08/29/2024)   Hunger Vital Sign    Worried About Running Out of Food in the Last Year: Not on file    Ran Out of Food in the Last Year: Never true  Transportation Needs: No Transportation Needs (08/29/2024)   PRAPARE - Administrator, Civil Service (Medical): No    Lack of Transportation (Non-Medical): No  Physical Activity: Not on file  Stress:  Not on file  Social Connections: Patient Declined (08/29/2024)   Social Connection and Isolation Panel    Frequency of Communication with Friends and Family: Patient declined    Frequency of Social Gatherings with Friends and Family: Patient declined    Attends Religious Services: Patient declined    Database Administrator or Organizations: Patient declined    Attends Banker Meetings: Patient declined    Marital Status: Patient declined  Intimate Partner Violence: Not At Risk (08/29/2024)   Humiliation, Afraid, Rape, and Kick questionnaire    Fear of Current or Ex-Partner: No    Emotionally Abused: No    Physically Abused: No    Sexually Abused: No    Review of Systems: As per HPI.  Physical Exam: Vital signs in last 24 hours: Temp:  [98.1 F (36.7 C)-98.6 F (37 C)] 98.2 F (36.8 C) (12/05 0547) Pulse Rate:  [49-55] 55 (12/05 0547) Resp:  [16-19] 18 (12/05 0226) BP: (111-159)/(66-89) 135/78 (12/05 0547) SpO2:  [95 %-100 %] 95 % (12/05 0547) Last BM Date : 08/29/24  General:   Alert,  Well-developed, overweight my, pleasant and cooperative in NAD Head:  Normocephalic and atraumatic. Eyes:  Sclera clear, no icterus.   Pallor  Ears:  Normal auditory acuity. Nose:  No deformity, discharge,  or lesions. Mouth:  No deformity or lesions.  Oropharynx pink & moist. Neck:  Supple; no masses or thyromegaly. Lungs:  Clear throughout to auscultation.   No wheezes, crackles, or rhonchi. No acute distress. Heart:  Regular rate and rhythm; no murmurs, clicks, rubs,  or gallops. Extremities:  Without clubbing or edema. Neurologic:  Alert and  oriented x4;  grossly normal neurologically. Skin:  Intact without significant lesions or rashes. Psych:  Alert and cooperative. Normal mood and affect. Abdomen:  Soft, nontender and nondistended. No masses, hepatosplenomegaly or hernias noted. Normal bowel sounds, without guarding, and without rebound.         Lab Results: Recent  Labs    08/29/24 1255 08/29/24 1934 08/30/24 0529  WBC 6.6 8.2 7.3  HGB 13.2 12.6* 10.7*  HCT 40.9 40.3 33.6*  PLT 157 168 119*   BMET Recent Labs    08/29/24 1255 08/30/24 0529  NA 140 141  K 4.8 4.6  CL 104 107  CO2 28 26  GLUCOSE 107* 106*  BUN 21 29*  CREATININE 1.33* 1.18  CALCIUM  9.7 8.9   LFT Recent Labs    08/29/24 1255  PROT 6.2*  ALBUMIN  4.0  AST 16  ALT 15  ALKPHOS 35*  BILITOT 0.5   PT/INR No results for input(s): LABPROT, INR in the last 72 hours.  Studies/Results: CT ANGIO GI BLEED Result Date: 08/29/2024 CLINICAL DATA:  Blood in stool.  Abdominal pain. EXAM: CTA ABDOMEN AND PELVIS WITHOUT AND WITH CONTRAST TECHNIQUE: Multidetector CT imaging of the abdomen and pelvis was performed using the standard protocol during bolus administration of intravenous contrast. Multiplanar reconstructed images and MIPs were obtained and reviewed to evaluate the vascular anatomy. RADIATION DOSE REDUCTION: This exam was performed according to the departmental dose-optimization program which includes automated exposure control, adjustment of the mA and/or kV according to patient size and/or use of iterative reconstruction technique. CONTRAST:  OMNIPAQUE  IOHEXOL  350 MG/ML SOLN COMPARISON:  CT 05/29/2023 FINDINGS: VASCULAR Aorta: Normal caliber aorta without aneurysm, dissection, vasculitis or significant stenosis. Moderate atherosclerosis. Celiac: Patent without evidence of aneurysm, dissection, vasculitis or significant stenosis. SMA: Plaque at the origin of the SMA causes less than 50% luminal stenosis. No aneurysm, dissection, or vasculitis. Renals: Both renal arteries are patent without evidence of aneurysm, dissection, vasculitis, fibromuscular dysplasia or significant stenosis. IMA: Patent without evidence of aneurysm, dissection, vasculitis or significant stenosis. Inflow: Patent without evidence of aneurysm, dissection, vasculitis or significant stenosis. Proximal  Outflow: Bilateral common femoral and visualized portions of the superficial and profunda femoral arteries are patent without evidence of aneurysm, dissection, vasculitis or significant stenosis. Veins: Venous phase imaging demonstrates patency of the IVC and iliac veins. Portal, splenic, and mesenteric veins are patent. No portal venous or mesenteric gas. Review of the MIP images confirms the above findings. NON-VASCULAR Lower chest: The heart is mildly enlarged. No basilar airspace disease or pleural effusion. Hepatobiliary: Stable cyst in the left lobe of the liver. No further follow-up imaging is recommended. No suspicious liver lesion. Noncalcified gallstone in the gallbladder. No pericholecystic inflammation. No biliary dilatation. Pancreas: No ductal dilatation or inflammation. Spleen: Normal in size without focal abnormality. Adrenals/Urinary Tract: No adrenal nodule. No hydronephrosis. No evidence of renal inflammation. Bilateral renal cysts. No further follow-up imaging is recommended. The urinary bladder is prominently distended. Bladder dome diverticula again seen. No bladder wall thickening. Stomach/Bowel: Findings suspicious for active bleed with contrast accumulation within redundant sigmoid colon, series 5, image 119. This increases on venous phase imaging, series 11 images 47 through 52. There are multiple colonic diverticula in this region as well as throughout the colon, but no discretely inflamed diverticula. Moderate volume of colonic stool in the proximal colon. No small bowel obstruction or inflammation. Areas of hyperdensity within the mid small bowel are present on precontrast exam and consistent with ingested material. The stomach is nondistended. Lymphatic: No lymphadenopathy. Reproductive: Enlarged prostate gland causes mass effect on the bladder base. Other: No free air or ascites. No abdominopelvic collection. Small fat containing bilateral inguinal hernias, left greater than right.  Small fat containing umbilical hernia. Musculoskeletal: Postsurgical change L3 through L5. Intact hardware. 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 There are no acute or suspicious osseous abnormalities. IMPRESSION: VASCULAR 1. Findings suspicious for active bleed within redundant sigmoid colon. 2.  Aortic Atherosclerosis (ICD10-I70.0). NON-VASCULAR 1. Diffuse colonic diverticulosis without focal diverticulitis. 2. Gallstone without cholecystitis. 3. Enlarged prostate gland causing mass effect on the bladder base. Prominently distended urinary bladder with bladder diverticula, query chronic bladder outlet obstruction. Electronically Signed   By: Andrea Gasman M.D.   On: 08/29/2024 15:25    Impression: Painless hematochezia presumed to be related to diverticulosis  CT angio showed active bleeding within redundant sigmoid colon  Nonspecific findings: Gallstones without cholecystitis, enlarged prostate causing mass effect on bladder base, distended urinary bladder with bladder diverticuli, possible chronic bladder outlet obstruction  Hemoglobin trend: 13.2/12.6/10.7 Blood work done at 10 AM, results pending  Lab abnormalities: Slightly elevated BUN 29, low protein 6.2  History of large duodenal ulcer noted on EGD from 05/2023, currently on pantoprazole  twice a day  Plan: Has been evaluated by Dr. Ester Sides from IR, as per him, no indication for angiogram or embolization given stability and lack of recurrent hematochezia.  Discussed with patient and his wife that if there is evidence of further or ongoing bleeding or significant drop in hemoglobin, IR will need to be notified again for possible angiogram.  Meanwhile, we will start patient on clear liquid diet, while monitoring his H&H which plans to transfuse if hemoglobin is less than 7.  Continue to hold Plavix .  If hemoglobin remains stable and patient does not have any further hematochezia, okay to advance to full liquid diet  for dinner.  Dr. Elicia to follow in a.m..   LOS: 1 day   Estelita Manas, MD  08/30/2024, 12:38 PM

## 2024-08-30 NOTE — TOC Initial Note (Signed)
 Transition of Care [facility]) - Initial/Assessment Note    Patient Details  Name: Ruben Rodriguez MRN: [id number] Date of Birth: 1946-07-01  Transition of [facility] CM/SW Contact:    Bascom Service, RN Phone Number: 08/30/2024, 1:55 PM  Clinical Narrative:  d/c plan home. Has own transport.                 Expected Discharge Plan: Home/Self Care Barriers to Discharge: Continued Medical Work up   Patient Goals and CMS Choice Patient states their goals for this hospitalization and ongoing recovery are:: Home CMS Medicare.gov Compare Post Acute Care list provided to:: Patient Choice offered to / list presented to : Patient [place] ownership interest in [facility].provided to:: Patient    Expected Discharge Plan and Services   Discharge Planning Services: CM Consult                                          Prior Living Arrangements/Services   Lives with:: Spouse   Do you feel safe going back to the place where you live?: Yes               Activities of Daily Living   ADL Screening (condition at time of admission) Independently performs ADLs?: Yes (appropriate for developmental age) Is the patient deaf or have difficulty hearing?: No Does the patient have difficulty seeing, even when wearing glasses/contacts?: No Does the patient have difficulty concentrating, remembering, or making decisions?: No  Permission Sought/Granted Permission sought to share information with : Case Manager Permission granted to share information with : Yes, Verbal Permission Granted              Emotional Assessment              Admission diagnosis:  Acute GI bleeding [K92.2] Acute urinary retention [R33.8] Patient Active Problem List   Diagnosis Date Noted   GI bleed 08/29/2024   Acute GI bleeding 08/29/2024   Acute urinary retention 08/29/2024   Duodenitis 05/30/2023   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/30/2023   History of CAD (coronary artery disease) 05/30/2023    BPH (benign prostatic hyperplasia) 01/20/2022   Chronic kidney disease, stage 2 (mild) 01/20/2022   Coronary artery disease 01/20/2022   Eczema 01/20/2022   Essential hypertension 01/20/2022   Hardening of the aorta (main artery of the heart) 01/20/2022   History of adenomatous polyp of colon 01/20/2022   Impaired fasting glucose 01/20/2022   Low back pain 01/20/2022   Hyperlipidemia 01/20/2022   Senile purpura 01/20/2022   Obesity 01/20/2022   Spinal stenosis 01/20/2022   Allergic bronchitis 01/20/2022   Abnormal cardiac CT angiography 08/27/2021   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 [place] [address] Phone: [phone] Fax: [phone]  [facility] [address] [place] [address] Phone: [phone] Fax: [phone]     Social Drivers of Health (SDOH) Social History: SDOH Screenings   Food Insecurity: Unknown (08/29/2024)  Housing: Unknown (08/29/2024)  Transportation Needs: No Transportation Needs (08/29/2024)  Utilities: Not At Risk (08/29/2024)  Social Connections: Patient Declined (08/29/2024)  Tobacco Use: Medium Risk (08/29/2024)   SDOH Interventions:  Readmission Risk Interventions     No data to display

## 2024-08-30 NOTE — Care Management Obs Status (Signed)
 MEDICARE OBSERVATION STATUS NOTIFICATION   Patient Details  Name: Ruben Rodriguez MRN: [id number] Date of Birth: 1946-08-31   Medicare Observation Status Notification Given:  Yes    MahabirNathanel, RN 08/30/2024, 1:48 PM

## 2024-08-30 NOTE — Progress Notes (Signed)
 PROGRESS NOTE    [name]  FMW:[id number] DOB: [birth year] DOA: 08/29/2024 PCP: Loreli Kins, MD    Brief Narrative:   Ruben Rodriguez is a 78 y.o. male with past medical history significant for HTN, HLD, CAD s/p PCI of LAD on Plavix , history of duodenal abscess who presented to [facility] ED on 08/29/2024 with complaints of abdominal pain over the last 2 days with new onset rectal bleeding.  Patient reports that he was getting ready for a meeting this morning, went to the bathroom with a large amount of bright red blood noted roughly 10:15 AM.  No previous history of rectal bleeding.  Last dose of Plavix  morning of 12/4.  Denies any current pain.  Further denies fever/chills, no cough, no nausea/vomiting, no chest pain, no shortness of breath.  Patient presented to the ED for further evaluation management.  Prior remote history of gastric ulcer in 1960s, 1 year ago found to have a duodenal ulcer.  In the ED, temperature 98.0 F, HR 56, RR 17, BP 10/19/1977, SpO2 95% on room air.  WBC 6.6, hemoglobin 13.2, platelet count 157.  Sodium 140, potassium 4.8, chloride 104, CO2 28, glucose 107, BUN 21, creat 1.33.  Lipase 13.  AST 16, ALT 15, total bilirubin 0.5.  High-sensitivity troponin less than 15 x 2.  FOBT positive.  CT angiogram GI bleed study with findings suspicious for active bleed within the redundant sigmoid colon, diffuse colonic diverticulosis without focal diverticulitis, gallstones without cholecystitis, a large prostate gland causing mass effect on the bladder base, probably distended urinary bladder with bladder diverticula, query chronic bladder outlet obstruction.  [facility] and IR were consulted.  TRH consulted for admission and patient was transferred to [facility] for further evaluation management.  Assessment & Plan:   Lower GI bleed, likely diverticular Patient presenting with acute onset bright red blood per rectum.  Complicated by Plavix  use  for CAD with PCI 1 year ago.  Complained of abdominal pain 2 days prior to bleeding event.  History of gastric/duodenal ulcers in the past.  Hemoglobin 13.2 on admission.  FOBT positive. CT angiogram GI bleed study with findings suspicious for active bleed within the redundant sigmoid colon, diffuse colonic diverticulosis.  Seen by interventional radiology, Dr. Jennefer; no indication for angiogram or embolization at this time given stability. -- [facility] consulted -- Hgb 13.2>12.6>10.7 -- Holding home Plavix  and DVT prophylaxis -- H&H every 6 hours -- Transfuse for hemoglobin less than 7.0 --NS at 75 mL/h -- N.p.o., awaiting GI evaluation  Acute versus chronic urinary retention BPH CT abdomen/pelvis with findings of a large prostate gland causing mass effect on the bladder base, probable distended urinary bladder with bladder diverticula, query chronic bladder outlet obstruction.  Foley catheter was placed in the ED with 1 L of urine output. -- Continue home finasteride  5 mg p.o. daily -- Consideration of voiding trial prior to discharge versus outpatient urology follow-up  HTN -- BP 135/78 --On bisoprolol -HCTZ 10-6.25 mg p.o. daily at baseline -- Hydralazine  10 mg IV every 6 hours as needed SBP greater than 160  Coronary artery disease s/p PCI Stable, no active chest pain.  Reports stent placed 12 months ago with recent follow-up with his cardiologist. --Continue to hold Plavix  as above  Hyperlipidemia Currently not on statin outpatient  GERD Continue PPI  Obesity, class I Body mass index is 31.01 kg/m.    DVT prophylaxis: SCDs Start: 08/29/24 1746    Code Status: Full Code Family  Communication: No family present at bedside this morning  Disposition Plan:  Level of care: Telemetry Status is: Observation The patient remains OBS appropriate and will d/c before 2 midnights.    Consultants:  [facility], Dr. Herschell Interventional radiology, Dr.  Jennefer  Procedures:  None  Antimicrobials:  None   Subjective: Seen examined bedside, lying in bed.  Denies any further bowel movements overnight.  Hemoglobin has trended down from 13.2 on admission, now 10.7.  Seen by interventional radiology overnight, no indication for angiogram or embolization at this time.  Awaiting GI evaluation.  Endorses some mild generalized weakness.  No other specific complaints, questions, concerns at this time.  Denies headache, no dizziness, no chest pain, no palpitations, no shortness of breath, no abdominal pain, no fever/chills/night sweats, no nausea/vomiting/diarrhea, no focal weakness, no fatigue, no paresthesias.  No acute events overnight per nursing.  Objective: Vitals:   08/29/24 1745 08/29/24 2202 08/30/24 0226 08/30/24 0547  BP: (!) 143/89 111/66 117/70 135/78  Pulse: (!) 50 (!) 55 (!) 54 (!) 55  Resp: 17 19 18    Temp: 98.3 F (36.8 C) 98.6 F (37 C) 98.1 F (36.7 C) 98.2 F (36.8 C)  TempSrc: Oral Oral Oral Oral  SpO2: 100% 96% 95% 95%  Weight:        Intake/Output Summary (Last 24 hours) at 08/30/2024 1018 Last data filed at 08/30/2024 0600 Gross per 24 hour  Intake 865.73 ml  Output 1700 ml  Net -834.27 ml   Filed Weights   08/29/24 1134  Weight: 95.3 kg    Examination:  Physical Exam: GEN: NAD, alert and oriented x 3, obese HEENT: NCAT, sclera clear, MMM PULM: CTAB w/o wheezes/crackles, normal respiratory effort, on room air CV: RRR w/o M/G/R GI: abd soft, NTND, + BS MSK: no peripheral edema, moves all extremity dependently NEURO: No focal neurologic deficit PSYCH: normal mood/affect Integumentary: No concerning rashes/lesions/wounds no exposed skin surfaces    Data Reviewed: I have personally reviewed following labs and imaging studies  CBC: Recent Labs  Lab 08/29/24 1255 08/29/24 1934 08/30/24 0529  WBC 6.6 8.2 7.3  NEUTROABS 4.5  --   --   HGB 13.2 12.6* 10.7*  HCT 40.9 40.3 33.6*  MCV 81.0 83.6 84.0   PLT 157 168 119*   Basic Metabolic Panel: Recent Labs  Lab 08/29/24 1255 08/30/24 0529  NA 140 141  K 4.8 4.6  CL 104 107  CO2 28 26  GLUCOSE 107* 106*  BUN 21 29*  CREATININE 1.33* 1.18  CALCIUM  9.7 8.9   GFR: Estimated Creatinine Clearance: 59.7 mL/min (by C-G formula based on SCr of 1.18 mg/dL). Liver Function Tests: Recent Labs  Lab 08/29/24 1255  AST 16  ALT 15  ALKPHOS 35*  BILITOT 0.5  PROT 6.2*  ALBUMIN 4.0   Recent Labs  Lab 08/29/24 1255  LIPASE 13   No results for input(s): AMMONIA in the last 168 hours. Coagulation Profile: No results for input(s): INR, PROTIME in the last 168 hours. Cardiac Enzymes: No results for input(s): CKTOTAL, CKMB, CKMBINDEX, TROPONINI in the last 168 hours. BNP (last 3 results) No results for input(s): PROBNP in the last 8760 hours. HbA1C: No results for input(s): HGBA1C in the last 72 hours. CBG: No results for input(s): GLUCAP in the last 168 hours. Lipid Profile: No results for input(s): CHOL, HDL, LDLCALC, TRIG, CHOLHDL, LDLDIRECT in the last 72 hours. Thyroid  Function Tests: No results for input(s): TSH, T4TOTAL, FREET4, T3FREE, THYROIDAB in the last  72 hours. Anemia Panel: No results for input(s): VITAMINB12, FOLATE, FERRITIN, TIBC, IRON, RETICCTPCT in the last 72 hours. Sepsis Labs: No results for input(s): PROCALCITON, LATICACIDVEN in the last 168 hours.  No results found for this or any previous visit (from the past 240 hours).       Radiology Studies: CT ANGIO GI BLEED Result Date: 08/29/2024 CLINICAL DATA:  Blood in stool.  Abdominal pain. EXAM: CTA ABDOMEN AND PELVIS WITHOUT AND WITH CONTRAST TECHNIQUE: Multidetector CT imaging of the abdomen and pelvis was performed using the standard protocol during bolus administration of intravenous contrast. Multiplanar reconstructed images and MIPs were obtained and reviewed to evaluate the vascular anatomy.  RADIATION DOSE REDUCTION: This exam was performed according to the departmental dose-optimization program which includes automated exposure control, adjustment of the mA and/or kV according to patient size and/or use of iterative reconstruction technique. CONTRAST:  OMNIPAQUE  IOHEXOL  350 MG/ML SOLN COMPARISON:  CT 05/29/2023 FINDINGS: VASCULAR Aorta: Normal caliber aorta without aneurysm, dissection, vasculitis or significant stenosis. Moderate atherosclerosis. Celiac: Patent without evidence of aneurysm, dissection, vasculitis or significant stenosis. SMA: Plaque at the origin of the SMA causes less than 50% luminal stenosis. No aneurysm, dissection, or vasculitis. Renals: Both renal arteries are patent without evidence of aneurysm, dissection, vasculitis, fibromuscular dysplasia or significant stenosis. IMA: Patent without evidence of aneurysm, dissection, vasculitis or significant stenosis. Inflow: Patent without evidence of aneurysm, dissection, vasculitis or significant stenosis. Proximal Outflow: Bilateral common femoral and visualized portions of the superficial and profunda femoral arteries are patent without evidence of aneurysm, dissection, vasculitis or significant stenosis. Veins: Venous phase imaging demonstrates patency of the IVC and iliac veins. Portal, splenic, and mesenteric veins are patent. No portal venous or mesenteric gas. Review of the MIP images confirms the above findings. NON-VASCULAR Lower chest: The heart is mildly enlarged. No basilar airspace disease or pleural effusion. Hepatobiliary: Stable cyst in the left lobe of the liver. No further follow-up imaging is recommended. No suspicious liver lesion. Noncalcified gallstone in the gallbladder. No pericholecystic inflammation. No biliary dilatation. Pancreas: No ductal dilatation or inflammation. Spleen: Normal in size without focal abnormality. Adrenals/Urinary Tract: No adrenal nodule. No hydronephrosis. No evidence of renal  inflammation. Bilateral renal cysts. No further follow-up imaging is recommended. The urinary bladder is prominently distended. Bladder dome diverticula again seen. No bladder wall thickening. Stomach/Bowel: Findings suspicious for active bleed with contrast accumulation within redundant sigmoid colon, series 5, image 119. This increases on venous phase imaging, series 11 images 47 through 52. There are multiple colonic diverticula in this region as well as throughout the colon, but no discretely inflamed diverticula. Moderate volume of colonic stool in the proximal colon. No small bowel obstruction or inflammation. Areas of hyperdensity within the mid small bowel are present on precontrast exam and consistent with ingested material. The stomach is nondistended. Lymphatic: No lymphadenopathy. Reproductive: Enlarged prostate gland causes mass effect on the bladder base. Other: No free air or ascites. No abdominopelvic collection. Small fat containing bilateral inguinal hernias, left greater than right. Small fat containing umbilical hernia. Musculoskeletal: Postsurgical change L3 through L5. Intact hardware. 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 There are no acute or suspicious osseous abnormalities. IMPRESSION: VASCULAR 1. Findings suspicious for active bleed within redundant sigmoid colon. 2.  Aortic Atherosclerosis (ICD10-I70.0). NON-VASCULAR 1. Diffuse colonic diverticulosis without focal diverticulitis. 2. Gallstone without cholecystitis. 3. Enlarged prostate gland causing mass effect on the bladder base. Prominently distended urinary bladder with bladder diverticula, query chronic bladder outlet obstruction.  Electronically Signed   By: Andrea Gasman M.D.   On: 08/29/2024 15:25        Scheduled Meds:  finasteride   5 mg Oral QHS   pantoprazole  (PROTONIX ) IV  40 mg Intravenous Q12H   Continuous Infusions:  sodium chloride  75 mL/hr at 08/30/24 0649     LOS: 1 day    Time spent: 52  minutes spent on 08/30/2024 caring for this patient face-to-face including chart review, ordering labs/tests, documenting, discussion with nursing staff, consultants, updating family and interview/physical exam    Camellia PARAS Tyrease Vandeberg, DO [facility] Available via Epic secure chat 7am-7pm After these hours, please refer to coverage provider listed on [email or web address] 08/30/2024, 10:18 AM

## 2024-08-30 NOTE — Care Management CC44 (Signed)
 Condition Code 44 Documentation Completed  Patient Details  Name: Ruben Rodriguez MRN: [id number] Date of Birth: 09/05/1946   Condition Code 44 given:  Yes Patient signature on Condition Code 44 notice:  Yes (patient declined to sign.) Documentation of 2 MD's agreement:  Yes Code 44 added to claim:  Yes    [facility], RN 08/30/2024, 1:48 PM

## 2024-08-31 DIAGNOSIS — K922 Gastrointestinal hemorrhage, unspecified: Secondary | ICD-10-CM | POA: Diagnosis not present

## 2024-08-31 DIAGNOSIS — K5730 Diverticulosis of large intestine without perforation or abscess without bleeding: Secondary | ICD-10-CM | POA: Diagnosis not present

## 2024-08-31 LAB — CBC
HCT: 29.3 % — ABNORMAL LOW (ref 39.0–52.0)
Hemoglobin: 9.3 g/dL — ABNORMAL LOW (ref 13.0–17.0)
MCH: 26.5 pg (ref 26.0–34.0)
MCHC: 31.7 g/dL (ref 30.0–36.0)
MCV: 83.5 fL (ref 80.0–100.0)
Platelets: 129 K/uL — ABNORMAL LOW (ref 150–400)
RBC: 3.51 MIL/uL — ABNORMAL LOW (ref 4.22–5.81)
RDW: 14.5 % (ref 11.5–15.5)
WBC: 5.5 K/uL (ref 4.0–10.5)
nRBC: 0 % (ref 0.0–0.2)

## 2024-08-31 LAB — HEMOGLOBIN AND HEMATOCRIT, BLOOD
HCT: 29.3 % — ABNORMAL LOW (ref 39.0–52.0)
HCT: 29.1 % — ABNORMAL LOW (ref 39.0–52.0)
Hemoglobin: 9.3 g/dL — ABNORMAL LOW (ref 13.0–17.0)
Hemoglobin: 9.3 g/dL — ABNORMAL LOW (ref 13.0–17.0)

## 2024-08-31 LAB — BASIC METABOLIC PANEL WITH GFR
Anion gap: 9 (ref 5–15)
BUN: 22 mg/dL (ref 8–23)
CO2: 24 mmol/L (ref 22–32)
Calcium: 8.6 mg/dL — ABNORMAL LOW (ref 8.9–10.3)
Chloride: 108 mmol/L (ref 98–111)
Creatinine, Ser: 0.91 mg/dL (ref 0.61–1.24)
GFR, Estimated: >60 mL/min (ref >60)
Glucose, Bld: 99 mg/dL (ref 70–99)
Potassium: 4.1 mmol/L (ref 3.5–5.1)
Sodium: 141 mmol/L (ref 135–145)

## 2024-08-31 MED ORDER — TAMSULOSIN HCL 0.4 MG PO CAPS
0.4000 mg | ORAL_CAPSULE | Freq: Every day | ORAL | Status: DC
  Administered 2024-08-31 – 2024-09-02 (×3): 0.4 mg via ORAL
  Filled 2024-08-31 (×3): qty 1

## 2024-08-31 NOTE — Plan of Care (Signed)

## 2024-08-31 NOTE — Progress Notes (Signed)
 [facility] Progress Note  Ruben Rodriguez 78 y.o. 02-Sep-1946  CC: Rectal bleeding   Subjective: Patient seen and examined at bedside.  Continues to have rectal bleeding.  Mild abdominal discomfort.  Denies nausea or vomiting.  ROS : Afebrile, negative for chest pain   Objective: Vital signs in last 24 hours: Vitals:   08/30/24 2018 08/31/24 0456  BP: 129/77 139/77  Pulse: 67 71  Resp: 18 17  Temp: 98.3 F (36.8 C) 97.6 F (36.4 C)  SpO2: 97% 95%    Physical Exam:  General:  Alert, cooperative, no distress, appears stated age  Head:  Normocephalic, without obvious abnormality, atraumatic  Eyes:  , EOM's intact,   Lungs:   No visible respiratory distress  Heart:  Regular rate and rhythm, S1, S2 normal  Abdomen:   Soft, non-tender, bowel sounds active all four quadrants,  no masses,   Extremities: Extremities normal, atraumatic, no  edema  Pulses: 2+ and symmetric    Lab Results: Recent Labs    08/30/24 0529 08/31/24 0541  NA 141 141  K 4.6 4.1  CL 107 108  CO2 26 24  GLUCOSE 106* 99  BUN 29* 22  CREATININE 1.18 0.91  CALCIUM  8.9 8.6*   Recent Labs    08/29/24 1255  AST 16  ALT 15  ALKPHOS 35*  BILITOT 0.5  PROT 6.2*  ALBUMIN 4.0   Recent Labs    08/29/24 1255 08/29/24 1934 08/30/24 0529 08/30/24 1044 08/30/24 2341 08/31/24 0541  WBC 6.6   < > 7.3  --   --  5.5  NEUTROABS 4.5  --   --   --   --   --   HGB 13.2   < > 10.7*   < > 9.2* 9.3*  HCT 40.9   < > 33.6*   < > 28.6* 29.3*  MCV 81.0   < > 84.0  --   --  83.5  PLT 157   < > 119*  --   --  129*   < > = values in this interval not displayed.   No results for input(s): LABPROT, INR in the last 72 hours.    Assessment/Plan: -Rectal bleeding with positive CT angio showing active bleeding and redundant sigmoid colon. -Acute blood loss anemia - History of duodenal ulcer in September 2024. - History of coronary artery disease currently on Plavix .  Last dose of Plavix  on  Thursday.  Recommendations -------------------------- - Patient's hemoglobin dropped to 9.3 this morning. - Recommend to continue to monitor H&H.  If ongoing drop in hemoglobin, consider flex sig tomorrow for treatment of diverticular bleed. - Okay to have clear liquid diet today.  Keep n.p.o. past midnight. - Management plan discussed with patient, family and RN at bedside.  Risks (bleeding, infection, bowel perforation that could require surgery, sedation-related changes in cardiopulmonary systems), benefits (identification and possible treatment of source of symptoms, exclusion of certain causes of symptoms), and alternatives (watchful waiting, radiographic imaging studies, empiric medical treatment)  were explained to patient/family in detail and patient wishes to proceed.   Layla Lah MD, FACP 08/31/2024, 10:39 AM  Contact #  [phone]

## 2024-08-31 NOTE — Progress Notes (Signed)
 PROGRESS NOTE    [facility]  FMW:[id number] DOB: August 24, 1946 DOA: 08/29/2024 PCP: Loreli Kins, MD    Brief Narrative:   Ruben Rodriguez is a 78 y.o. male with past medical history significant for HTN, HLD, CAD s/p PCI of LAD on Plavix , history of duodenal abscess who presented to [facility] ED on 08/29/2024 with complaints of abdominal pain over the last 2 days with new onset rectal bleeding.  Patient reports that he was getting ready for a meeting this morning, went to the bathroom with a large amount of bright red blood noted roughly 10:15 AM.  No previous history of rectal bleeding.  Last dose of Plavix  morning of 12/4.  Denies any current pain.  Further denies fever/chills, no cough, no nausea/vomiting, no chest pain, no shortness of breath.  Patient presented to the ED for further evaluation management.  Prior remote history of gastric ulcer in 1960s, 1 year ago found to have a duodenal ulcer.  In the ED, temperature 98.0 F, HR 56, RR 17, BP 10/19/1977, SpO2 95% on room air.  WBC 6.6, hemoglobin 13.2, platelet count 157.  Sodium 140, potassium 4.8, chloride 104, CO2 28, glucose 107, BUN 21, creat 1.33.  Lipase 13.  AST 16, ALT 15, total bilirubin 0.5.  High-sensitivity troponin less than 15 x 2.  FOBT positive.  CT angiogram GI bleed study with findings suspicious for active bleed within the redundant sigmoid colon, diffuse colonic diverticulosis without focal diverticulitis, gallstones without cholecystitis, a large prostate gland causing mass effect on the bladder base, probably distended urinary bladder with bladder diverticula, query chronic bladder outlet obstruction.  [facility] and [facility] were consulted.  [facility] consulted for admission and patient was transferred to [facility] for further evaluation management.  Assessment & Plan:   Lower GI bleed, likely diverticular Patient presenting with acute onset bright red blood per rectum.  Complicated by Plavix  use  for CAD with PCI 1 year ago.  Complained of abdominal pain 2 days prior to bleeding event.  History of gastric/duodenal ulcers in the past.  Hemoglobin 13.2 on admission.  FOBT positive. CT angiogram GI bleed study with findings suspicious for active bleed within the redundant sigmoid colon, diffuse colonic diverticulosis.  Seen by interventional radiology, Dr. Jennefer; no indication for angiogram or embolization at this time given stability. -- [facility] following, appreciate assistance -- Hgb 13.2>12.6>10.7>10.4>9.6>9.2>9.3>9.3 -- Holding home Plavix  and DVT prophylaxis -- H&H every 6 hours -- Transfuse for hemoglobin less than 7.0 -- Strict I's and O's, monitor bowel movements -- Full liquid diet, n.p.o. after midnight for potential sigmoidoscopy 12/7  Acute versus chronic urinary retention BPH CT abdomen/pelvis with findings of a large prostate gland causing mass effect on the bladder base, probable distended urinary bladder with bladder diverticula, query chronic bladder outlet obstruction.  Foley catheter was placed in the ED with 1 L of urine output. -- Continue home finasteride  5 mg p.o. daily -- Start tamsulosin  0.4 mg p.o. daily  -- Voiding trial today, bladder scan at next void following Foley catheter removal or if no void in 6 hours  HTN -- BP 139/77 -- On bisoprolol -HCTZ 10-6.25 mg p.o. daily at baseline; will hold for now -- Hydralazine  10 mg IV every 6 hours as needed SBP greater than 160  Coronary artery disease s/p PCI Stable, no active chest pain.  Reports stent placed 12 months ago with recent follow-up with his cardiologist. -- Continue to hold Plavix  as above  Hyperlipidemia Currently not  on statin outpatient  GERD Continue PPI  Obesity, class I Body mass index is 31.01 kg/m.    DVT prophylaxis: SCDs Start: 08/29/24 1746    Code Status: Full Code Family Communication: No family present at bedside this morning  Disposition Plan:  Level of care:  Med-Surg Status is: Observation The patient remains OBS appropriate and will d/c before 2 midnights.    Consultants:  [facility], Dr. Saintclair, Dr. Elicia Interventional radiology, Dr. Jennefer  Procedures:  None  Antimicrobials:  None   Subjective: Seen examined bedside, lying in bed.  Spouse present at bedside.  Hemoglobin has drifted down to 9.3 this morning.  Seen by GI, if has continued downtrend of hemoglobin or further bleeding; plan sigmoidoscopy anoscopy tomorrow.  Also discussed Foley catheter removal/voiding trial today, patient agreeable.  No specific complaints, questions, concerns at this time.  Denies headache, no dizziness, no chest pain, no palpitations, no shortness of breath, no abdominal pain, no fever/chills/night sweats, no nausea/vomiting/diarrhea, no focal weakness, no fatigue, no paresthesias.  No acute events overnight per nursing.  Objective: Vitals:   08/30/24 0547 08/30/24 1402 08/30/24 2018 08/31/24 0456  BP: 135/78 132/73 129/77 139/77  Pulse: (!) 55 (!) 56 67 71  Resp:  20 18 17   Temp: 98.2 F (36.8 C) 98.1 F (36.7 C) 98.3 F (36.8 C) 97.6 F (36.4 C)  TempSrc: Oral Oral Oral Oral  SpO2: 95% 97% 97% 95%  Weight:        Intake/Output Summary (Last 24 hours) at 08/31/2024 1210 Last data filed at 08/31/2024 1100 Gross per 24 hour  Intake --  Output 1100 ml  Net -1100 ml   Filed Weights   08/29/24 1134  Weight: 95.3 kg    Examination:  Physical Exam: GEN: NAD, alert and oriented x 3, obese HEENT: NCAT, sclera clear, MMM PULM: CTAB w/o wheezes/crackles, normal respiratory effort, on room air CV: RRR w/o M/G/R GI: abd soft, NTND, + BS MSK: no peripheral edema, moves all extremity dependently NEURO: No focal neurologic deficit PSYCH: normal mood/affect Integumentary: No concerning rashes/lesions/wounds no exposed skin surfaces    Data Reviewed: I have personally reviewed following labs and imaging studies  CBC: Recent  Labs  Lab 08/29/24 1255 08/29/24 1934 08/30/24 0529 08/30/24 1044 08/30/24 1654 08/30/24 2341 08/31/24 0541 08/31/24 1128  WBC 6.6 8.2 7.3  --   --   --  5.5  --   NEUTROABS 4.5  --   --   --   --   --   --   --   HGB 13.2 12.6* 10.7* 10.4* 9.6* 9.2* 9.3* 9.3*  HCT 40.9 40.3 33.6* 32.6* 31.0* 28.6* 29.3* 29.3*  MCV 81.0 83.6 84.0  --   --   --  83.5  --   PLT 157 168 119*  --   --   --  129*  --    Basic Metabolic Panel: Recent Labs  Lab 08/29/24 1255 08/30/24 0529 08/31/24 0541  NA 140 141 141  K 4.8 4.6 4.1  CL 104 107 108  CO2 28 26 24   GLUCOSE 107* 106* 99  BUN 21 29* 22  CREATININE 1.33* 1.18 0.91  CALCIUM  9.7 8.9 8.6*   GFR: Estimated Creatinine Clearance: 77.4 mL/min (by C-G formula based on SCr of 0.91 mg/dL). Liver Function Tests: Recent Labs  Lab 08/29/24 1255  AST 16  ALT 15  ALKPHOS 35*  BILITOT 0.5  PROT 6.2*  ALBUMIN 4.0   Recent Labs  Lab  08/29/24 1255  LIPASE 13   No results for input(s): AMMONIA in the last 168 hours. Coagulation Profile: No results for input(s): INR, PROTIME in the last 168 hours. Cardiac Enzymes: No results for input(s): CKTOTAL, CKMB, CKMBINDEX, TROPONINI in the last 168 hours. BNP (last 3 results) No results for input(s): PROBNP in the last 8760 hours. HbA1C: No results for input(s): HGBA1C in the last 72 hours. CBG: No results for input(s): GLUCAP in the last 168 hours. Lipid Profile: No results for input(s): CHOL, HDL, LDLCALC, TRIG, CHOLHDL, LDLDIRECT in the last 72 hours. Thyroid  Function Tests: No results for input(s): TSH, T4TOTAL, FREET4, T3FREE, THYROIDAB in the last 72 hours. Anemia Panel: No results for input(s): VITAMINB12, FOLATE, FERRITIN, TIBC, IRON, RETICCTPCT in the last 72 hours. Sepsis Labs: No results for input(s): PROCALCITON, LATICACIDVEN in the last 168 hours.  No results found for this or any previous visit (from the past 240  hours).       Radiology Studies: CT ANGIO GI BLEED Result Date: 08/29/2024 CLINICAL DATA:  Blood in stool.  Abdominal pain. EXAM: CTA ABDOMEN AND PELVIS WITHOUT AND WITH CONTRAST TECHNIQUE: Multidetector CT imaging of the abdomen and pelvis was performed using the standard protocol during bolus administration of intravenous contrast. Multiplanar reconstructed images and MIPs were obtained and reviewed to evaluate the vascular anatomy. RADIATION DOSE REDUCTION: This exam was performed according to the departmental dose-optimization program which includes automated exposure control, adjustment of the mA and/or kV according to patient size and/or use of iterative reconstruction technique. CONTRAST:  OMNIPAQUE  IOHEXOL  350 MG/ML SOLN COMPARISON:  CT 05/29/2023 FINDINGS: VASCULAR Aorta: Normal caliber aorta without aneurysm, dissection, vasculitis or significant stenosis. Moderate atherosclerosis. Celiac: Patent without evidence of aneurysm, dissection, vasculitis or significant stenosis. SMA: Plaque at the origin of the SMA causes less than 50% luminal stenosis. No aneurysm, dissection, or vasculitis. Renals: Both renal arteries are patent without evidence of aneurysm, dissection, vasculitis, fibromuscular dysplasia or significant stenosis. IMA: Patent without evidence of aneurysm, dissection, vasculitis or significant stenosis. Inflow: Patent without evidence of aneurysm, dissection, vasculitis or significant stenosis. Proximal Outflow: Bilateral common femoral and visualized portions of the superficial and profunda femoral arteries are patent without evidence of aneurysm, dissection, vasculitis or significant stenosis. Veins: Venous phase imaging demonstrates patency of the IVC and iliac veins. Portal, splenic, and mesenteric veins are patent. No portal venous or mesenteric gas. Review of the MIP images confirms the above findings. NON-VASCULAR Lower chest: The heart is mildly enlarged. No basilar  airspace disease or pleural effusion. Hepatobiliary: Stable cyst in the left lobe of the liver. No further follow-up imaging is recommended. No suspicious liver lesion. Noncalcified gallstone in the gallbladder. No pericholecystic inflammation. No biliary dilatation. Pancreas: No ductal dilatation or inflammation. Spleen: Normal in size without focal abnormality. Adrenals/Urinary Tract: No adrenal nodule. No hydronephrosis. No evidence of renal inflammation. Bilateral renal cysts. No further follow-up imaging is recommended. The urinary bladder is prominently distended. Bladder dome diverticula again seen. No bladder wall thickening. Stomach/Bowel: Findings suspicious for active bleed with contrast accumulation within redundant sigmoid colon, series 5, image 119. This increases on venous phase imaging, series 11 images 47 through 52. There are multiple colonic diverticula in this region as well as throughout the colon, but no discretely inflamed diverticula. Moderate volume of colonic stool in the proximal colon. No small bowel obstruction or inflammation. Areas of hyperdensity within the mid small bowel are present on precontrast exam and consistent with ingested material. The stomach is nondistended.  Lymphatic: No lymphadenopathy. Reproductive: Enlarged prostate gland causes mass effect on the bladder base. Other: No free air or ascites. No abdominopelvic collection. Small fat containing bilateral inguinal hernias, left greater than right. Small fat containing umbilical hernia. Musculoskeletal: Postsurgical change L3 through L5. Intact hardware. 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 There are no acute or suspicious osseous abnormalities. IMPRESSION: VASCULAR 1. Findings suspicious for active bleed within redundant sigmoid colon. 2.  Aortic Atherosclerosis (ICD10-I70.0). NON-VASCULAR 1. Diffuse colonic diverticulosis without focal diverticulitis. 2. Gallstone without cholecystitis. 3. Enlarged prostate  gland causing mass effect on the bladder base. Prominently distended urinary bladder with bladder diverticula, query chronic bladder outlet obstruction. Electronically Signed   By: Andrea Gasman M.D.   On: 08/29/2024 15:25        Scheduled Meds:  finasteride   5 mg Oral QHS   pantoprazole  (PROTONIX ) IV  40 mg Intravenous Q12H   tamsulosin   0.4 mg Oral Daily   Continuous Infusions:     LOS: 1 day    Time spent: 54 minutes spent on 08/31/2024 caring for this patient face-to-face including chart review, ordering labs/tests, documenting, discussion with nursing staff, consultants, updating family and interview/physical exam    Camellia PARAS Lanore Renderos, DO [facility] Available via Epic secure chat 7am-7pm After these hours, please refer to coverage provider listed on [email or web address] 08/31/2024, 12:10 PM

## 2024-08-31 NOTE — Progress Notes (Signed)
 Pt has not voided since foley removal earlier today at approx 1110.  Pt has gotten up to the bathroom a few times to attempt to void.  Pt states he has no urge.  Pt bladder scanned for .  Dr. Austria made aware at approx 1800.  Per MD, monitor for another 6 hours before considering to replace foley.  Pt and SO updated on POC

## 2024-09-01 DIAGNOSIS — K922 Gastrointestinal hemorrhage, unspecified: Secondary | ICD-10-CM | POA: Diagnosis not present

## 2024-09-01 DIAGNOSIS — K5730 Diverticulosis of large intestine without perforation or abscess without bleeding: Secondary | ICD-10-CM | POA: Diagnosis not present

## 2024-09-01 LAB — CBC
HCT: 27.9 % — ABNORMAL LOW (ref 39.0–52.0)
Hemoglobin: 9 g/dL — ABNORMAL LOW (ref 13.0–17.0)
MCH: 26.3 pg (ref 26.0–34.0)
MCHC: 32.3 g/dL (ref 30.0–36.0)
MCV: 81.6 fL (ref 80.0–100.0)
Platelets: 132 K/uL — ABNORMAL LOW (ref 150–400)
RBC: 3.42 MIL/uL — ABNORMAL LOW (ref 4.22–5.81)
RDW: 14.4 % (ref 11.5–15.5)
WBC: 7.3 K/uL (ref 4.0–10.5)
nRBC: 0 % (ref 0.0–0.2)

## 2024-09-01 LAB — BASIC METABOLIC PANEL WITH GFR
Anion gap: 9 (ref 5–15)
BUN: 18 mg/dL (ref 8–23)
CO2: 24 mmol/L (ref 22–32)
Calcium: 8.5 mg/dL — ABNORMAL LOW (ref 8.9–10.3)
Chloride: 104 mmol/L (ref 98–111)
Creatinine, Ser: 1.03 mg/dL (ref 0.61–1.24)
GFR, Estimated: >60 mL/min (ref >60)
Glucose, Bld: 117 mg/dL — ABNORMAL HIGH (ref 70–99)
Potassium: 3.9 mmol/L (ref 3.5–5.1)
Sodium: 137 mmol/L (ref 135–145)

## 2024-09-01 LAB — HEMOGLOBIN AND HEMATOCRIT, BLOOD
HCT: 27.3 % — ABNORMAL LOW (ref 39.0–52.0)
Hemoglobin: 8.8 g/dL — ABNORMAL LOW (ref 13.0–17.0)

## 2024-09-01 MED ORDER — CHLORHEXIDINE GLUCONATE CLOTH 2 % EX PADS
6.0000 | MEDICATED_PAD | Freq: Every day | CUTANEOUS | Status: DC
  Administered 2024-09-01 – 2024-09-02 (×2): 6 via TOPICAL

## 2024-09-01 MED ORDER — POLYETHYLENE GLYCOL 3350 17 G PO PACK
17.0000 g | PACK | Freq: Every day | ORAL | Status: DC
  Administered 2024-09-01 – 2024-09-02 (×2): 17 g via ORAL
  Filled 2024-09-01 (×2): qty 1

## 2024-09-01 MED ORDER — LACTATED RINGERS IV SOLN: INTRAVENOUS | Status: DC

## 2024-09-01 NOTE — Progress Notes (Signed)
 [facility] Progress Note  Ruben Rodriguez 78 y.o. October 31, 1945  CC: Rectal bleeding   Subjective: Patient seen and examined at bedside.  Denies any further bleeding.  No bowel movement since yesterday morning.  ROS : Afebrile, negative for chest pain   Objective: Vital signs in last 24 hours: Vitals:   08/31/24 2052 09/01/24 0536  BP: 138/74 136/78  Pulse: 61 72  Resp: 18 17  Temp: 98 F (36.7 C) 98.6 F (37 C)  SpO2: 98% 96%    Physical Exam:  General:  Alert, cooperative, no distress, appears stated age  Head:  Normocephalic, without obvious abnormality, atraumatic  Eyes:  , EOM's intact,   Lungs:   No visible respiratory distress  Heart:  Regular rate and rhythm, S1, S2 normal  Abdomen:   Soft, non-tender, bowel sounds active all four quadrants,  no masses,   Extremities: Extremities normal, atraumatic, no  edema  Pulses: 2+ and symmetric    Lab Results: Recent Labs    08/31/24 0541 09/01/24 0243  NA 141 137  K 4.1 3.9  CL 108 104  CO2 24 24  GLUCOSE 99 117*  BUN 22 18  CREATININE 0.91 1.03  CALCIUM  8.6* 8.5*   Recent Labs    08/29/24 1255  AST 16  ALT 15  ALKPHOS 35*  BILITOT 0.5  PROT 6.2*  ALBUMIN 4.0   Recent Labs    08/29/24 1255 08/29/24 1934 08/31/24 0541 08/31/24 1128 08/31/24 1850 09/01/24 0243  WBC 6.6   < > 5.5  --   --  7.3  NEUTROABS 4.5  --   --   --   --   --   HGB 13.2   < > 9.3*   < > 9.3* 9.0*  HCT 40.9   < > 29.3*   < > 29.1* 27.9*  MCV 81.0   < > 83.5  --   --  81.6  PLT 157   < > 129*  --   --  132*   < > = values in this interval not displayed.   No results for input(s): LABPROT, INR in the last 72 hours.    Assessment/Plan: -Rectal bleeding with positive CT angio showing active bleeding and redundant sigmoid colon. -Acute blood loss anemia - History of duodenal ulcer in September 2024. - History of coronary artery disease currently on Plavix .  Last dose of Plavix  on  Thursday.  Recommendations -------------------------- - No further bleeding episodes.  No bowel movement since yesterday morning.  Hemoglobin stable at 9.  Hopefully bleeding has slowed down.  Discussed role of flexible sigmoidoscopy and patient would like to hold off on it for now. - Add MiraLAX  to avoid constipation - Repeat H&H this evening and again tomorrow morning. - Start soft diet and advance as tolerated. - GI will follow.  Hopefully discharge tomorrow if no further bleeding and hemoglobin stable.  -Multiple questions answered in presence of RN.   Layla Lah MD, FACP 09/01/2024, 8:54 AM  Contact #  [phone]

## 2024-09-01 NOTE — Progress Notes (Signed)
 NSG 0700-1400: Pt's SO had left a list of questions for GI as she was not going to be here when he rounded.  This RN, patient, and and Dr. Elicia sat down together and reviewed every question, one by one, that was written out.  Pt verbalized good understanding of entire conversation.    At shift change at approx 1400, pt's wife at the bedside, had many questions about the POC, discharge plan.  She appeared to be agitated, her leg shaking, her voice loud.  This RN along with new shift RN listened to pt's wife concerns and questions.  This RN explained that we discussed her list of questions with GI earlier today.  This RN went over all over the MD's responses to her questions.    Per pt, covering hospitalist reviewed with pt that he would be discharged (possibly tomorrow) with the foley and follow up with urology as outpatient.  Per pt, the appointment would be made by the MD. (this RN was not in the room).  Pt's wife upset that we did not know when that appointment would be at this time.  Discussed that the information would print out on the AVS at the time of discharge once the appt was made.  PT's SO upset, saying that there has been poor communication.  Upset that GI didn't discuss the GU problems.  This RN said that consulting MD's focused on their specialty and that the hospitalist was in charge of the big picture and that they were the one's responsible for the pt's discharge when the time comes.    This RN answered all questions at the bedside.  SO still appeared to be agitated.  Pt was appropriate, calm, cooperative and demonstrated good understanding and agreement of current POC and discharge plan.    This RN reported off to next shift RN

## 2024-09-01 NOTE — Progress Notes (Addendum)
 PROGRESS NOTE    Claudy Abdallah [facility]  FMW:[id number] DOB: 04/16/1946 DOA: 08/29/2024 PCP: Loreli Kins, MD   Brief Narrative: 78 year old past medical history significant for hypertension, hyperlipidemia, CAD status post PCI on LAD on Plavix , history of duodenal abscess presents on 12//2025 complaining of abdominal pain for the last 2 days with new onset of rectal bleeding.  He report a large amount of bright red blood per rectum.  Last dose of Plavix  was the morning of 12/4.  He has a prior history of gastric ulcer 1916 and history of duodenal ulcer 1 year ago.  CT angiogram bleeding study with finding consistent with suspicious for active bleed within the redundant sigmoid colon, diffuse colonic diverticulosis without focal diverticulitis, gallstones without cholecystitis and enlarged prostate gland causing mass effect on the bladder base, probably distended urinary bladder with bladder diverticuli query chronic bladder outlet obstruction.  GI and IR was consulted,   Assessment & Plan:   Principal Problem:   Acute GI bleeding Active Problems:   Acute urinary retention   Coronary artery disease   Essential hypertension   BPH (benign prostatic hyperplasia)   Hyperlipidemia   1-Lower GI bleed, likely diverticular bleed: - Patient presents with acute onset of right blood per rectum complicated by Plavix  for CAD a year ago.  Presented with abdominal pain GI bleed. - CT angiogram GI study findings suspicious for active bleeding within the redundant sigmoid colon, diffuse colonic diverticulosis. - Seen by interventional radiology Dr. Bobbette no indication for angiogram or embolization at this time given his stability. - [facility] following -GI planning out patient sigmoidoscopy now Plan to monitor Hb overnight, advance diet to soft. Home tomorrow if hb remain stable.    Acute versus chronic urinary retention BPH - CT abdomen and pelvis showed large prostate gland causing mass effect on  the bladder base, probably distended urinary bladder with bladder diverticuli query chronic bladder outlet obstruction. - Foley catheter placed in the ED via 1 L of urine out put.  -Started on Flomax  continue finasteride  -Fail voiding trial. Foley replaced overnight.  He will need to be discharge with foley catheter. Follow up with urology   Hypertension: -Holding bisoprolol  and hydrochlorothiazide  due to soft blood pressure in the setting of GI bleed  Coronary artery disease status post PCI Stent placed more than 12 months ago -follow GI recommendation in regards when to resume plavix .   Hyperlipidemia -Not on statins.   GERD -Continue with PPI  Obesity, type I: -Needs life style modification.         Estimated body mass index is 31.01 kg/m as calculated from the following:   Height as of 08/08/24: 5' 9 (1.753 m).   Weight as of this encounter: 95.3 kg.   DVT prophylaxis: SCD Code Status: Full code Family Communication: Crae discussed with patient.  Came to update wife this afternoon, I offer to answer any GI questions she had, she politely decline. I informed her I will contact [facility] tomorrow to made referral for follow up for urine retention, I told her I don't know for when I would be able to get him appointment.  Disposition Plan:  Status is: Observation The patient remains OBS appropriate and will d/c before 2 midnights.    Consultants:  GI  Procedures:  Foley catheter  Antimicrobials:    Subjective: He is alert, he is afraid of eating breakfast, he manage intake rates.  Denies any fever or bloody bowel movement for the last 24 hours or more.  Denies abdominal pain.  He was not able to urinate overnight and Foley catheter was replaced last night. We talked that he would need follow-up with urology for further evaluation of prostate gland.   Objective: Vitals:   08/31/24 0456 08/31/24 1341 08/31/24 2052 09/01/24 0536  BP: 139/77 125/74  138/74 136/78  Pulse: 71 64 61 72  Resp: 17 16 18 17   Temp: 97.6 F (36.4 C) 98.1 F (36.7 C) 98 F (36.7 C) 98.6 F (37 C)  TempSrc: Oral Oral Oral Oral  SpO2: 95% 98% 98% 96%  Weight:        Intake/Output Summary (Last 24 hours) at 09/01/2024 0731 Last data filed at 09/01/2024 0700 Gross per 24 hour  Intake 960 ml  Output 1590 ml  Net -630 ml   Filed Weights   08/29/24 1134  Weight: 95.3 kg    Examination:  General exam: Appears calm and comfortable  Respiratory system: Clear to auscultation. Respiratory effort normal. Cardiovascular system: S1 & S2 heard, RRR. No JVD, murmurs, rubs, gallops or clicks. No pedal edema. Gastrointestinal system: Abdomen is nondistended, soft and nontender. No organomegaly or masses felt. Normal bowel sounds heard. Central nervous system: Alert and oriented. No focal neurological deficits. Extremities: Symmetric 5 x 5 power.    Data Reviewed: I have personally reviewed following labs and imaging studies  CBC: Recent Labs  Lab 08/29/24 1255 08/29/24 1934 08/30/24 0529 08/30/24 1044 08/30/24 2341 08/31/24 0541 08/31/24 1128 08/31/24 1850 09/01/24 0243  WBC 6.6 8.2 7.3  --   --  5.5  --   --  7.3  NEUTROABS 4.5  --   --   --   --   --   --   --   --   HGB 13.2 12.6* 10.7*   < > 9.2* 9.3* 9.3* 9.3* 9.0*  HCT 40.9 40.3 33.6*   < > 28.6* 29.3* 29.3* 29.1* 27.9*  MCV 81.0 83.6 84.0  --   --  83.5  --   --  81.6  PLT 157 168 119*  --   --  129*  --   --  132*   < > = values in this interval not displayed.   Basic Metabolic Panel: Recent Labs  Lab 08/29/24 1255 08/30/24 0529 08/31/24 0541 09/01/24 0243  NA 140 141 141 137  K 4.8 4.6 4.1 3.9  CL 104 107 108 104  CO2 28 26 24 24   GLUCOSE 107* 106* 99 117*  BUN 21 29* 22 18  CREATININE 1.33* 1.18 0.91 1.03  CALCIUM  9.7 8.9 8.6* 8.5*   GFR: Estimated Creatinine Clearance: 68.4 mL/min (by C-G formula based on SCr of 1.03 mg/dL). Liver Function Tests: Recent Labs  Lab  08/29/24 1255  AST 16  ALT 15  ALKPHOS 35*  BILITOT 0.5  PROT 6.2*  ALBUMIN 4.0   Recent Labs  Lab 08/29/24 1255  LIPASE 13   No results for input(s): AMMONIA in the last 168 hours. Coagulation Profile: No results for input(s): INR, PROTIME in the last 168 hours. Cardiac Enzymes: No results for input(s): CKTOTAL, CKMB, CKMBINDEX, TROPONINI in the last 168 hours. BNP (last 3 results) No results for input(s): PROBNP in the last 8760 hours. HbA1C: No results for input(s): HGBA1C in the last 72 hours. CBG: No results for input(s): GLUCAP in the last 168 hours. Lipid Profile: No results for input(s): CHOL, HDL, LDLCALC, TRIG, CHOLHDL, LDLDIRECT in the last 72 hours. Thyroid  Function Tests: No results for input(s): TSH,  T4TOTAL, FREET4, T3FREE, THYROIDAB in the last 72 hours. Anemia Panel: No results for input(s): VITAMINB12, FOLATE, FERRITIN, TIBC, IRON, RETICCTPCT in the last 72 hours. Sepsis Labs: No results for input(s): PROCALCITON, LATICACIDVEN in the last 168 hours.  No results found for this or any previous visit (from the past 240 hours).       Radiology Studies: No results found.      Scheduled Meds:  Chlorhexidine  Gluconate Cloth  6 each Topical Daily   finasteride   5 mg Oral QHS   pantoprazole  (PROTONIX ) IV  40 mg Intravenous Q12H   tamsulosin   0.4 mg Oral Daily   Continuous Infusions:   LOS: 1 day    Time spent: 35 Minutes    Claud Gowan A Lesley Galentine, MD [facility]   If 7PM-7AM, please contact night-coverage [email or web address]  09/01/2024, 7:31 AM

## 2024-09-01 NOTE — Plan of Care (Signed)

## 2024-09-02 DIAGNOSIS — K922 Gastrointestinal hemorrhage, unspecified: Secondary | ICD-10-CM | POA: Diagnosis not present

## 2024-09-02 DIAGNOSIS — K5730 Diverticulosis of large intestine without perforation or abscess without bleeding: Secondary | ICD-10-CM | POA: Diagnosis not present

## 2024-09-02 LAB — CBC
HCT: 27.6 % — ABNORMAL LOW (ref 39.0–52.0)
Hemoglobin: 8.9 g/dL — ABNORMAL LOW (ref 13.0–17.0)
MCH: 27.2 pg (ref 26.0–34.0)
MCHC: 32.2 g/dL (ref 30.0–36.0)
MCV: 84.4 fL (ref 80.0–100.0)
Platelets: 143 K/uL — ABNORMAL LOW (ref 150–400)
RBC: 3.27 MIL/uL — ABNORMAL LOW (ref 4.22–5.81)
RDW: 14.5 % (ref 11.5–15.5)
WBC: 4.9 K/uL (ref 4.0–10.5)
nRBC: 0 % (ref 0.0–0.2)

## 2024-09-02 MED ORDER — POLYETHYLENE GLYCOL 3350 17 G PO PACK: 17.0000 g | PACK | Freq: Every day | ORAL | 0 refills | Status: DC

## 2024-09-02 MED ORDER — FERROUS SULFATE 325 (65 FE) MG PO TABS: 325.0000 mg | ORAL_TABLET | Freq: Every day | ORAL | Status: DC

## 2024-09-02 MED ORDER — FERROUS SULFATE 325 (65 FE) MG PO TABS: 325.0000 mg | ORAL_TABLET | Freq: Every day | ORAL | 3 refills | Status: AC

## 2024-09-02 MED ORDER — TAMSULOSIN HCL 0.4 MG PO CAPS: 0.4000 mg | ORAL_CAPSULE | Freq: Every day | ORAL | 0 refills | Status: AC

## 2024-09-02 NOTE — TOC Transition Note (Signed)
 Transition of Care [facility]) - Discharge Note   Patient Details  Name: Ruben Rodriguez MRN: [id number] Date of Birth: May 12, 1946  [facility] CM/SW Contact:  Bascom Service, RN Phone Number: 09/02/2024, 10:51 AM   Clinical Narrative:d/c home no CM needs.       Final next level of care: Home/Self Care Barriers to Discharge: No Barriers Identified   Patient Goals and CMS Choice Patient states their goals for this hospitalization and ongoing recovery are:: Home CMS Medicare.gov Compare Post Acute Care list provided to:: Patient Choice offered to / list presented to : Patient [place] ownership interest in [facility].provided to:: Patient    Discharge Placement                       Discharge Plan and Services Additional resources added to the After Visit Summary for     Discharge Planning Services: CM Consult                                 Social Drivers of Health (SDOH) Interventions SDOH Screenings   Food Insecurity: Unknown (08/29/2024)  Housing: Unknown (08/29/2024)  Transportation Needs: No Transportation Needs (08/29/2024)  Utilities: Not At Risk (08/29/2024)  Social Connections: Patient Declined (08/29/2024)  Tobacco Use: Medium Risk (08/29/2024)     Readmission Risk Interventions     No data to display

## 2024-09-02 NOTE — Plan of Care (Signed)

## 2024-09-02 NOTE — Discharge Summary (Signed)
 Physician Discharge Summary   Patient: Ruben Rodriguez MRN: [id number] DOB: 1946-01-20  Admit date:     08/29/2024  Discharge date: 09/02/24  Discharge Physician: Owen DELENA Lore   PCP: Loreli Kins, MD   Recommendations at discharge:   Needs  follow up with PCP to monitor Hb.  Follow up with Dr Rosalie for further evaluation of GI bleed.  Appointment with Urology was made for 12/15 for voiding trial and further evaluation of BPH  Discharge Diagnoses: Principal Problem:   Acute GI bleeding Active Problems:   Acute urinary retention   Coronary artery disease   Essential hypertension   BPH (benign prostatic hyperplasia)   Hyperlipidemia  Resolved Problems:   * No resolved hospital problems. [facility]: 78 year old past medical history significant for hypertension, hyperlipidemia, CAD status post PCI on LAD on Plavix , history of duodenal abscess presents on 12//2025 complaining of abdominal pain for the last 2 days with new onset of rectal bleeding.  He report a large amount of bright red blood per rectum.  Last dose of Plavix  was the morning of 12/4.  He has a prior history of gastric ulcer 1916 and history of duodenal ulcer 1 year ago.   CT angiogram bleeding study with finding consistent with suspicious for active bleed within the redundant sigmoid colon, diffuse colonic diverticulosis without focal diverticulitis, gallstones without cholecystitis and enlarged prostate gland causing mass effect on the bladder base, probably distended urinary bladder with bladder diverticuli query chronic bladder outlet obstruction.   GI and IR was consulted, treated with support care.   Assessment and Plan: 1-Lower GI bleed, likely diverticular bleed: - Patient presents with acute onset of right blood per rectum complicated by Plavix  for CAD a year ago.  Presented with abdominal pain GI bleed. - CT angiogram GI study findings suspicious for active bleeding within the redundant sigmoid  colon, diffuse colonic diverticulosis. - Seen by interventional radiology Dr. Bobbette no indication for angiogram or embolization at this time given his stability. - [facility] following -GI planning out patient sigmoidoscopy  Evaluated by GI, per gi ok to discharge home, hb has remain stable at 8.9 he had dark stool with tinge blood commode might be old blood.   plan to resume plavix  now in 72 hours, discussed with Dr Elicia.   Acute versus chronic urinary retention BPH - CT abdomen and pelvis showed large prostate gland causing mass effect on the bladder base, probably distended urinary bladder with bladder diverticuli query chronic bladder outlet obstruction. - Foley catheter placed in the ED via 1 L of urine out put.  -Started on Flomax  continue finasteride  -Fail voiding trial. Foley replaced overnight.  He will need to be discharge with foley catheter. Follow up with urology was arrange.    Hypertension: -Holding bisoprolol  and hydrochlorothiazide  due to soft blood pressure in the setting of GI bleed -resume Norvasc  tomorrow if BP above 140  Coronary artery disease status post PCI Stent placed more than 12 months ago -Per GI resume Plavix  in 72 hours.    Hyperlipidemia -Not on statins.    GERD -Continue with PPI   Obesity, type I: -Needs life style modification.                  Consultants: GI Procedures performed: None Disposition: Home Diet recommendation:  Cardiac diet DISCHARGE MEDICATION: Allergies as of 09/02/2024       Reactions   Fish Allergy Anaphylaxis, Rash   Tolerates salmon and tuna  Penicillins Anaphylaxis   Repatha  [evolocumab ] Shortness Of Breath, Rash   Pain, vision problems    Atorvastatin Other (See Comments)   Muscle aches   Ezetimibe Other (See Comments)   Muscle aches   Zocor [simvastatin] Other (See Comments)   Muscle aches   Amoxicillin Other (See Comments)   MD advised patient to not take this, either   Crestor   [rosuvastatin  Calcium ] Other (See Comments)   Joint and muscle pain        Medication List     PAUSE taking these medications    clopidogrel  75 MG tablet Wait to take this until: September 05, 2024 Commonly known as: PLAVIX  TAKE 1 TABLET BY MOUTH EVERY DAY       STOP taking these medications    bisoprolol -hydrochlorothiazide  10-6.25 MG tablet Commonly known as: ZIAC        TAKE these medications    acetaminophen  650 MG CR tablet Commonly known as: TYLENOL  Take 1,300 mg by mouth every 8 (eight) hours as needed for pain.   amLODipine  10 MG tablet Commonly known as: NORVASC  Take 10 mg by mouth daily.   diphenhydrAMINE  25 MG tablet Commonly known as: BENADRYL  Take 25 mg by mouth daily as needed for itching.   ferrous sulfate  325 (65 FE) MG tablet Take 1 tablet (325 mg total) by mouth daily with breakfast. Start taking on: September 03, 2024   finasteride  5 MG tablet Commonly known as: PROSCAR  Take 5 mg by mouth at bedtime.   fluocinonide ointment 0.05 % Commonly known as: LIDEX Apply 1 Application topically daily as needed (dryness inside ears).   nitroGLYCERIN  0.4 MG SL tablet Commonly known as: Nitrostat  Place 1 tablet (0.4 mg total) under the tongue every 5 (five) minutes as needed.   pantoprazole  40 MG tablet Commonly known as: PROTONIX  Take 1 tablet (40 mg total) by mouth 2 (two) times daily.   polyethylene glycol 17 g packet Commonly known as: MIRALAX  / GLYCOLAX  Take 17 g by mouth daily. Start taking on: September 03, 2024   REFRESH OP Place 1 drop into both eyes daily as needed (dry eyes).   tamsulosin  0.4 MG Caps capsule Commonly known as: FLOMAX  Take 1 capsule (0.4 mg total) by mouth daily. Start taking on: September 03, 2024        Follow-up Information     Loreli Kins, MD Follow up in 1 week(s).   Specialty: Family Medicine Why: please contact your PCP for follow up, you will need CBC to follow your blood count. Contact  information: [address] [place] [address] [phone]         Nicholaus Sherlyn CROME, NP Follow up.   Why: appointment 12/15 at 8 am. Contact information: [address] [place] [address] [phone]         Rosalie Kitchens, MD. Schedule an appointment as soon as possible for a visit in 4 week(s).   Specialty: Gastroenterology Why: Follow-up for lower GI bleed/diverticular bleed. Contact information: [address] [place] [address] [phone]                Discharge Exam: Fredricka Weights   08/29/24 1134 09/01/24 2201  Weight: 95.3 kg 95.7 kg   General; NAD  Condition at discharge: stable  The results of significant diagnostics from this hospitalization (including imaging, microbiology, ancillary and laboratory) are listed below for reference.   Imaging Studies: CT ANGIO GI BLEED Result Date: 08/29/2024 CLINICAL DATA:  Blood in stool.  Abdominal pain. EXAM: CTA ABDOMEN AND PELVIS WITHOUT AND WITH CONTRAST TECHNIQUE: Multidetector CT imaging of the abdomen and pelvis was performed using the standard protocol during bolus administration of intravenous contrast. Multiplanar reconstructed images and MIPs were obtained and reviewed to evaluate the vascular anatomy. RADIATION DOSE REDUCTION: This exam was performed according to the departmental dose-optimization program which includes automated exposure control, adjustment of the mA and/or kV according to patient size and/or use of iterative reconstruction technique. CONTRAST:  OMNIPAQUE  IOHEXOL  350 MG/ML SOLN COMPARISON:  CT 05/29/2023 FINDINGS: VASCULAR Aorta: Normal caliber aorta without aneurysm, dissection, vasculitis or significant stenosis. Moderate atherosclerosis. Celiac: Patent without evidence of aneurysm, dissection, vasculitis or significant stenosis. SMA: Plaque at the origin of the SMA causes less than 50% luminal stenosis. No aneurysm, dissection, or  vasculitis. Renals: Both renal arteries are patent without evidence of aneurysm, dissection, vasculitis, fibromuscular dysplasia or significant stenosis. IMA: Patent without evidence of aneurysm, dissection, vasculitis or significant stenosis. Inflow: Patent without evidence of aneurysm, dissection, vasculitis or significant stenosis. Proximal Outflow: Bilateral common femoral and visualized portions of the superficial and profunda femoral arteries are patent without evidence of aneurysm, dissection, vasculitis or significant stenosis. Veins: Venous phase imaging demonstrates patency of the IVC and iliac veins. Portal, splenic, and mesenteric veins are patent. No portal venous or mesenteric gas. Review of the MIP images confirms the above findings. NON-VASCULAR Lower chest: The heart is mildly enlarged. No basilar airspace disease or pleural effusion. Hepatobiliary: Stable cyst in the left lobe of the liver. No further follow-up imaging is recommended. No suspicious liver lesion. Noncalcified gallstone in the gallbladder. No pericholecystic inflammation. No biliary dilatation. Pancreas: No ductal dilatation or inflammation. Spleen: Normal in size without focal abnormality. Adrenals/Urinary Tract: No adrenal nodule. No hydronephrosis. No evidence of renal inflammation. Bilateral renal cysts. No further follow-up imaging is recommended. The urinary bladder is prominently distended. Bladder dome diverticula again seen. No bladder wall thickening. Stomach/Bowel: Findings suspicious for active bleed with contrast accumulation within redundant sigmoid colon, series 5, image 119. This increases on venous phase imaging, series 11 images 47 through 52. There are multiple colonic diverticula in this region as well as throughout the colon, but no discretely inflamed diverticula. Moderate volume of colonic stool in the proximal colon. No small bowel obstruction or inflammation. Areas of hyperdensity within the mid small bowel  are present on precontrast exam and consistent with ingested material. The stomach is nondistended. Lymphatic: No lymphadenopathy. Reproductive: Enlarged prostate gland causes mass effect on the bladder base. Other: No free air or ascites. No abdominopelvic collection. Small fat containing bilateral inguinal hernias, left greater than right. Small fat containing umbilical hernia. Musculoskeletal: Postsurgical change L3 through L5. Intact hardware. 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 There are no acute or suspicious osseous abnormalities. IMPRESSION: VASCULAR 1. Findings suspicious for active bleed within redundant sigmoid colon. 2.  Aortic Atherosclerosis (ICD10-I70.0). NON-VASCULAR 1. Diffuse colonic diverticulosis without focal diverticulitis. 2. Gallstone without cholecystitis. 3. Enlarged prostate gland causing mass effect on the bladder base. Prominently distended urinary bladder with bladder diverticula, query chronic bladder outlet obstruction. Electronically Signed   By: Andrea Gasman M.D.   On: 08/29/2024 15:25    Microbiology: Results for orders placed or performed during the hospital encounter of 05/29/23  Culture, blood (Routine X 2) w Reflex to ID Panel     Status: None   Collection Time: 05/30/23  5:14 AM   Specimen: BLOOD  Result Value Ref Range Status  Specimen Description BLOOD BLOOD LEFT ARM  Final   Special Requests   Final    BOTTLES DRAWN AEROBIC AND ANAEROBIC Blood Culture adequate volume   Culture   Final    NO GROWTH 5 DAYS Performed at [facility], [address] [place], [place] [address]    Report Status 06/04/2023 FINAL  Final  Culture, blood (Routine X 2) w Reflex to ID Panel     Status: None   Collection Time: 05/30/23  5:14 AM   Specimen: BLOOD  Result Value Ref Range Status   Specimen Description BLOOD BLOOD LEFT HAND  Final   Special Requests   Final    BOTTLES DRAWN AEROBIC AND ANAEROBIC Blood Culture adequate volume   Culture   Final     NO GROWTH 5 DAYS Performed at [facility], [address] [place], [place] [address]    Report Status 06/04/2023 FINAL  Final    Labs: CBC: Recent Labs  Lab 08/29/24 1255 08/29/24 1934 08/30/24 0529 08/30/24 1044 08/31/24 0541 08/31/24 1128 08/31/24 1850 09/01/24 0243 09/01/24 1653 09/02/24 0511  WBC 6.6 8.2 7.3  --  5.5  --   --  7.3  --  4.9  NEUTROABS 4.5  --   --   --   --   --   --   --   --   --   HGB 13.2 12.6* 10.7*   < > 9.3* 9.3* 9.3* 9.0* 8.8* 8.9*  HCT 40.9 40.3 33.6*   < > 29.3* 29.3* 29.1* 27.9* 27.3* 27.6*  MCV 81.0 83.6 84.0  --  83.5  --   --  81.6  --  84.4  PLT 157 168 119*  --  129*  --   --  132*  --  143*   < > = values in this interval not displayed.   Basic Metabolic Panel: Recent Labs  Lab 08/29/24 1255 08/30/24 0529 08/31/24 0541 09/01/24 0243  NA 140 141 141 137  K 4.8 4.6 4.1 3.9  CL 104 107 108 104  CO2 28 26 24 24   GLUCOSE 107* 106* 99 117*  BUN 21 29* 22 18  CREATININE 1.33* 1.18 0.91 1.03  CALCIUM  9.7 8.9 8.6* 8.5*   Liver Function Tests: Recent Labs  Lab 08/29/24 1255  AST 16  ALT 15  ALKPHOS 35*  BILITOT 0.5  PROT 6.2*  ALBUMIN 4.0   CBG: No results for input(s): GLUCAP in the last 168 hours.  Discharge time spent: greater than 30 minutes.  Signed: Owen DELENA Lore, MD [facility] 09/02/2024

## 2024-09-02 NOTE — Plan of Care (Signed)
  Problem: Education: Goal: Knowledge of General Education information will improve Description: Including pain rating scale, medication(s)/side effects and non-pharmacologic comfort measures 09/02/2024 1123 by Alaina Dozier PARAS, RN Outcome: Adequate for Discharge 09/02/2024 0747 by Alaina Dozier PARAS, RN Outcome: Progressing   Problem: Health Behavior/Discharge Planning: Goal: Ability to manage health-related needs will improve 09/02/2024 1123 by Alaina Dozier PARAS, RN Outcome: Adequate for Discharge 09/02/2024 0747 by Alaina Dozier PARAS, RN Outcome: Progressing   Problem: Clinical Measurements: Goal: Ability to maintain clinical measurements within normal limits will improve 09/02/2024 1123 by Alaina Dozier PARAS, RN Outcome: Adequate for Discharge 09/02/2024 0747 by Alaina Dozier PARAS, RN Outcome: Progressing Goal: Will remain free from infection 09/02/2024 1123 by Alaina Dozier PARAS, RN Outcome: Adequate for Discharge 09/02/2024 0747 by Alaina Dozier PARAS, RN Outcome: Progressing Goal: Diagnostic test results will improve Outcome: Adequate for Discharge Goal: Respiratory complications will improve Outcome: Adequate for Discharge Goal: Cardiovascular complication will be avoided Outcome: Adequate for Discharge   Problem: Activity: Goal: Risk for activity intolerance will decrease Outcome: Adequate for Discharge   Problem: Nutrition: Goal: Adequate nutrition will be maintained Outcome: Adequate for Discharge   Problem: Coping: Goal: Level of anxiety will decrease Outcome: Adequate for Discharge   Problem: Elimination: Goal: Will not experience complications related to bowel motility Outcome: Adequate for Discharge Goal: Will not experience complications related to urinary retention Outcome: Adequate for Discharge   Problem: Pain Managment: Goal: General experience of comfort will improve and/or be controlled Outcome: Adequate for Discharge    Problem: Safety: Goal: Ability to remain free from injury will improve Outcome: Adequate for Discharge   Problem: Skin Integrity: Goal: Risk for impaired skin integrity will decrease Outcome: Adequate for Discharge

## 2024-09-02 NOTE — Progress Notes (Signed)
 PT Cancellation Note  Patient Details Name: Ruben Rodriguez MRN: [id number] DOB: [birth year]   Cancelled Treatment:    Reason Eval/Treat Not Completed: Other (comment) (pt politely declines). Pt politely declines PT evaluation. Pt reports being ind at home, active at the gym 6 days/week, reports ambulated entire hallway last night with nursing and noted in chart. Pt's spouse at bedside also in agreement with plan. Will sign off at this time, please re-consult if needs arise.   Metta Ave PT, DPT 09/02/24, 9:34 AM

## 2024-09-02 NOTE — Discharge Summary (Incomplete)
 Physician Discharge Summary   Patient: Ruben Rodriguez MRN: [id number] DOB: [birth year]  Admit date:     08/29/2024  Discharge date: 09/02/24  Discharge Physician: Owen DELENA Lore   PCP: Loreli Kins, MD   Recommendations at discharge:  {Tip this will not be part of the note when signed- Example include specific recommendations for outpatient follow-up, pending tests to follow-up on. (Optional):26781}   Discharge Diagnoses: Principal Problem:   Acute GI bleeding Active Problems:   Acute urinary retention   Coronary artery disease   Essential hypertension   BPH (benign prostatic hyperplasia)   Hyperlipidemia  Resolved Problems:   * No resolved hospital problems. [facility] Course: No notes on file  Assessment and Plan: * Acute GI bleeding Patient presented for evaluation of 2 episodes of bright red blood per rectum today.  Patient reported abdominal discomfort but not overt pain over the past few days.  CTA bleed scan in the ED was positive for active bleed in the sigmoid colon.  Patient's hemoglobin normal in the ED at 13.2.  Hemodynamically stable. Patient notes prior history of a duodenal ulcer but has not had any abdominal pain similar to what he had at that time.  He reports his cardiac stent was 12 months ago this week and had recently followed up with cardiology.  Remains on Plavix . -- Trend hemoglobin closely --Transfuse PRBCs if hemoglobin less than 7 or less than 8 with active bleeding --Type & screen ordered --GI (Dr. Saintclair) and IR (Dr. Jennefer) were consulted in the ED  --NPO for now --Monitor hemodynamics closely --Hold Plavix  and DVT prophylaxis --SCDs --IV PPI twice daily empirically  Acute urinary retention Foley placed in the ED with over 1 L urine output --Consider voiding trial prior to discharge versus outpatient urology follow-up  Essential hypertension BP stable on admission, later mildly elevated.   --Hold home antihypertensives given GI  bleeding and risk for hypotension --Titrate regimen --As needed hydralazine  for now  Coronary artery disease Stable, no active chest pain.  Patient reports his cardiac stent was 12 months ago this week and he had recently followed up with cardiologist --Hold Plavix  --Check EKG and troponin if chest pain  Hyperlipidemia Per med history does not appear to be on statin  BPH (benign prostatic hyperplasia) Resume home Proscar       {Tip this will not be part of the note when signed Body mass index is 31.16 kg/m. , ,  (Optional):26781}  {(NOTE) Pain control PDMP Statment (Optional):26782} Consultants: *** Procedures performed: ***  Disposition: {Plan; Disposition:26390} Diet recommendation:  {Diet_Plan:26776} DISCHARGE MEDICATION: Allergies as of 09/02/2024       Reactions   Fish Allergy Anaphylaxis, Rash   Tolerates salmon and tuna   Penicillins Anaphylaxis   Repatha  [evolocumab ] Shortness Of Breath, Rash   Pain, vision problems    Atorvastatin Other (See Comments)   Muscle aches   Ezetimibe Other (See Comments)   Muscle aches   Zocor [simvastatin] Other (See Comments)   Muscle aches   Amoxicillin Other (See Comments)   MD advised patient to not take this, either   Crestor  [rosuvastatin  Calcium ] Other (See Comments)   Joint and muscle pain        Medication List     PAUSE taking these medications    clopidogrel  75 MG tablet Wait to take this until: September 05, 2024 Commonly known as: PLAVIX  TAKE 1 TABLET BY MOUTH EVERY DAY       STOP taking these medications  bisoprolol -hydrochlorothiazide  10-6.25 MG tablet Commonly known as: ZIAC        TAKE these medications    acetaminophen  650 MG CR tablet Commonly known as: TYLENOL  Take 1,300 mg by mouth every 8 (eight) hours as needed for pain.   amLODipine  10 MG tablet Commonly known as: NORVASC  Take 10 mg by mouth daily.   diphenhydrAMINE  25 MG tablet Commonly known as: BENADRYL  Take 25 mg by  mouth daily as needed for itching.   ferrous sulfate  325 (65 FE) MG tablet Take 1 tablet (325 mg total) by mouth daily with breakfast. Start taking on: September 03, 2024   finasteride  5 MG tablet Commonly known as: PROSCAR  Take 5 mg by mouth at bedtime.   fluocinonide ointment 0.05 % Commonly known as: LIDEX Apply 1 Application topically daily as needed (dryness inside ears).   nitroGLYCERIN  0.4 MG SL tablet Commonly known as: Nitrostat  Place 1 tablet (0.4 mg total) under the tongue every 5 (five) minutes as needed.   pantoprazole  40 MG tablet Commonly known as: PROTONIX  Take 1 tablet (40 mg total) by mouth 2 (two) times daily.   polyethylene glycol 17 g packet Commonly known as: MIRALAX  / GLYCOLAX  Take 17 g by mouth daily. Start taking on: September 03, 2024   REFRESH OP Place 1 drop into both eyes daily as needed (dry eyes).   tamsulosin  0.4 MG Caps capsule Commonly known as: FLOMAX  Take 1 capsule (0.4 mg total) by mouth daily. Start taking on: September 03, 2024        Follow-up Information     Loreli Kins, MD Follow up in 1 week(s).   Specialty: Family Medicine Why: please contact your PCP for follow up, you will need CBC to follow your blood count. Contact information: [address] [place] [address] [phone]         Nicholaus Sherlyn CROME, NP Follow up.   Why: appointment 12/15 at 8 am. Contact information: [address] [place] [address] [phone]         Rosalie Kitchens, MD. Schedule an appointment as soon as possible for a visit in 4 week(s).   Specialty: Gastroenterology Why: Follow-up for lower GI bleed/diverticular bleed. Contact information: [address] [place] [address] [phone]                Discharge Exam: Fredricka Weights   08/29/24 1134 09/01/24 2201  Weight: 95.3 kg 95.7 kg   ***  Condition at discharge: {DC Condition:26389}  The results of significant diagnostics  from this hospitalization (including imaging, microbiology, ancillary and laboratory) are listed below for reference.   Imaging Studies: CT ANGIO GI BLEED Result Date: 08/29/2024 CLINICAL DATA:  Blood in stool.  Abdominal pain. EXAM: CTA ABDOMEN AND PELVIS WITHOUT AND WITH CONTRAST TECHNIQUE: Multidetector CT imaging of the abdomen and pelvis was performed using the standard protocol during bolus administration of intravenous contrast. Multiplanar reconstructed images and MIPs were obtained and reviewed to evaluate the vascular anatomy. RADIATION DOSE REDUCTION: This exam was performed according to the departmental dose-optimization program which includes automated exposure control, adjustment of the mA and/or kV according to patient size and/or use of iterative reconstruction technique. CONTRAST:  OMNIPAQUE  IOHEXOL  350 MG/ML SOLN COMPARISON:  CT 05/29/2023 FINDINGS: VASCULAR Aorta: Normal caliber aorta without aneurysm, dissection, vasculitis or significant stenosis. Moderate atherosclerosis. Celiac: Patent without evidence of aneurysm, dissection, vasculitis or significant stenosis. SMA: Plaque at the origin of the SMA causes less than  50% luminal stenosis. No aneurysm, dissection, or vasculitis. Renals: Both renal arteries are patent without evidence of aneurysm, dissection, vasculitis, fibromuscular dysplasia or significant stenosis. IMA: Patent without evidence of aneurysm, dissection, vasculitis or significant stenosis. Inflow: Patent without evidence of aneurysm, dissection, vasculitis or significant stenosis. Proximal Outflow: Bilateral common femoral and visualized portions of the superficial and profunda femoral arteries are patent without evidence of aneurysm, dissection, vasculitis or significant stenosis. Veins: Venous phase imaging demonstrates patency of the IVC and iliac veins. Portal, splenic, and mesenteric veins are patent. No portal venous or mesenteric gas. Review of the MIP images  confirms the above findings. NON-VASCULAR Lower chest: The heart is mildly enlarged. No basilar airspace disease or pleural effusion. Hepatobiliary: Stable cyst in the left lobe of the liver. No further follow-up imaging is recommended. No suspicious liver lesion. Noncalcified gallstone in the gallbladder. No pericholecystic inflammation. No biliary dilatation. Pancreas: No ductal dilatation or inflammation. Spleen: Normal in size without focal abnormality. Adrenals/Urinary Tract: No adrenal nodule. No hydronephrosis. No evidence of renal inflammation. Bilateral renal cysts. No further follow-up imaging is recommended. The urinary bladder is prominently distended. Bladder dome diverticula again seen. No bladder wall thickening. Stomach/Bowel: Findings suspicious for active bleed with contrast accumulation within redundant sigmoid colon, series 5, image 119. This increases on venous phase imaging, series 11 images 47 through 52. There are multiple colonic diverticula in this region as well as throughout the colon, but no discretely inflamed diverticula. Moderate volume of colonic stool in the proximal colon. No small bowel obstruction or inflammation. Areas of hyperdensity within the mid small bowel are present on precontrast exam and consistent with ingested material. The stomach is nondistended. Lymphatic: No lymphadenopathy. Reproductive: Enlarged prostate gland causes mass effect on the bladder base. Other: No free air or ascites. No abdominopelvic collection. Small fat containing bilateral inguinal hernias, left greater than right. Small fat containing umbilical hernia. Musculoskeletal: Postsurgical change L3 through L5. Intact hardware. 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 There are no acute or suspicious osseous abnormalities. IMPRESSION: VASCULAR 1. Findings suspicious for active bleed within redundant sigmoid colon. 2.  Aortic Atherosclerosis (ICD10-I70.0). NON-VASCULAR 1. Diffuse colonic  diverticulosis without focal diverticulitis. 2. Gallstone without cholecystitis. 3. Enlarged prostate gland causing mass effect on the bladder base. Prominently distended urinary bladder with bladder diverticula, query chronic bladder outlet obstruction. Electronically Signed   By: Andrea Gasman M.D.   On: 08/29/2024 15:25    Microbiology: Results for orders placed or performed during the hospital encounter of 05/29/23  Culture, blood (Routine X 2) w Reflex to ID Panel     Status: None   Collection Time: 05/30/23  5:14 AM   Specimen: BLOOD  Result Value Ref Range Status   Specimen Description BLOOD BLOOD LEFT ARM  Final   Special Requests   Final    BOTTLES DRAWN AEROBIC AND ANAEROBIC Blood Culture adequate volume   Culture   Final    NO GROWTH 5 DAYS Performed at [facility], [address], [place], [place] [address]    Report Status 06/04/2023 FINAL  Final  Culture, blood (Routine X 2) w Reflex to ID Panel     Status: None   Collection Time: 05/30/23  5:14 AM   Specimen: BLOOD  Result Value Ref Range Status   Specimen Description BLOOD BLOOD LEFT HAND  Final   Special Requests   Final    BOTTLES DRAWN AEROBIC AND ANAEROBIC Blood Culture adequate volume   Culture  Final    NO GROWTH 5 DAYS Performed at [facility], [address] [place], [place] [address]    Report Status 06/04/2023 FINAL  Final    Labs: CBC: Recent Labs  Lab 08/29/24 1255 08/29/24 1934 08/30/24 0529 08/30/24 1044 08/31/24 0541 08/31/24 1128 08/31/24 1850 09/01/24 0243 09/01/24 1653 09/02/24 0511  WBC 6.6 8.2 7.3  --  5.5  --   --  7.3  --  4.9  NEUTROABS 4.5  --   --   --   --   --   --   --   --   --   HGB 13.2 12.6* 10.7*   < > 9.3* 9.3* 9.3* 9.0* 8.8* 8.9*  HCT 40.9 40.3 33.6*   < > 29.3* 29.3* 29.1* 27.9* 27.3* 27.6*  MCV 81.0 83.6 84.0  --  83.5  --   --  81.6  --  84.4  PLT 157 168 119*  --  129*  --   --  132*  --  143*   < > = values in this interval not displayed.    Basic Metabolic Panel: Recent Labs  Lab 08/29/24 1255 08/30/24 0529 08/31/24 0541 09/01/24 0243  NA 140 141 141 137  K 4.8 4.6 4.1 3.9  CL 104 107 108 104  CO2 28 26 24 24   GLUCOSE 107* 106* 99 117*  BUN 21 29* 22 18  CREATININE 1.33* 1.18 0.91 1.03  CALCIUM  9.7 8.9 8.6* 8.5*   Liver Function Tests: Recent Labs  Lab 08/29/24 1255  AST 16  ALT 15  ALKPHOS 35*  BILITOT 0.5  PROT 6.2*  ALBUMIN 4.0   CBG: No results for input(s): GLUCAP in the last 168 hours.  Discharge time spent: {LESS THAN/GREATER UYJW:73611} 30 minutes.  Signed: Owen DELENA Lore, MD [facility] 09/02/2024

## 2024-09-02 NOTE — Progress Notes (Signed)
 [facility] Progress Note  Ruben Rodriguez 78 y.o. October 17, 1945  CC: Rectal bleeding   Subjective: Patient seen and examined at bedside.  Had 1 bowel movement recently which showed dark-colored stool.  Denies bright red blood per rectum.  Wife at bedside.  ROS : Afebrile, negative for chest pain   Objective: Vital signs in last 24 hours: Vitals:   09/02/24 0422 09/02/24 0424  BP: (!) 142/89   Pulse: 78   Resp: 16   Temp:  98.6 F (37 C)  SpO2: 96%     Physical Exam:  General:  Alert, cooperative, no distress, appears stated age  Head:  Normocephalic, without obvious abnormality, atraumatic  Eyes:  , EOM's intact,   Lungs:   No visible respiratory distress  Heart:  Regular rate and rhythm, S1, S2 normal  Abdomen:   Soft, non-tender, bowel sounds active all four quadrants,  no masses,   Extremities: Extremities normal, atraumatic, no  edema  Pulses: 2+ and symmetric    Lab Results: Recent Labs    08/31/24 0541 09/01/24 0243  NA 141 137  K 4.1 3.9  CL 108 104  CO2 24 24  GLUCOSE 99 117*  BUN 22 18  CREATININE 0.91 1.03  CALCIUM  8.6* 8.5*   No results for input(s): AST, ALT, ALKPHOS, BILITOT, PROT, ALBUMIN in the last 72 hours.  Recent Labs    09/01/24 0243 09/01/24 1653 09/02/24 0511  WBC 7.3  --  4.9  HGB 9.0* 8.8* 8.9*  HCT 27.9* 27.3* 27.6*  MCV 81.6  --  84.4  PLT 132*  --  143*   No results for input(s): LABPROT, INR in the last 72 hours.    Assessment/Plan: -Rectal bleeding with positive CT angio showing active bleeding and redundant sigmoid colon. -Acute blood loss anemia - History of duodenal ulcer in September 2024. - History of coronary artery disease currently on Plavix .  Last dose of Plavix  on Thursday.  Recommendations -------------------------- - Hemoglobin stable at 8.9.  Had dark-colored stool likely old blood.  No bright red blood per rectum. -Wife at bedside and multiple questions answered again.  Those  were the same question that I answered yesterday. -Recommend high-fiber diet.  Avoid constipation. -Take MiraLAX  on a regular basis. -Take iron supplements with food to bring back up hemoglobin and iron stores. -Slowly advance diet as tolerated. -Okay to resume Plavix  after 48 hours. - No further inpatient GI workup planned.  Follow-up with primary GI Dr. Rosalie after discharge.   Ruben Lah MD, FACP 09/02/2024, 8:39 AM  Contact #  [phone]

## 2024-09-02 NOTE — Discharge Instructions (Addendum)
 Resume amlodipine  12/09 if your Blood pressure is above 140.  You may resume Plavix  in 72 hours   You have new prescription for flomax  and follow up with Urology for urine retention and Prostate glan evaluation.   You need close follow up with PCP Dr Loreli to have lab work and check hemoglobin.   I sent a prescription for Iron supplement.

## 2024-09-09 DIAGNOSIS — R338 Other retention of urine: Secondary | ICD-10-CM | POA: Diagnosis not present

## 2024-09-11 DIAGNOSIS — R339 Retention of urine, unspecified: Secondary | ICD-10-CM | POA: Diagnosis not present

## 2024-09-11 DIAGNOSIS — D500 Iron deficiency anemia secondary to blood loss (chronic): Secondary | ICD-10-CM | POA: Diagnosis not present

## 2024-09-11 DIAGNOSIS — R338 Other retention of urine: Secondary | ICD-10-CM | POA: Diagnosis not present

## 2024-09-23 ENCOUNTER — Inpatient Hospital Stay (HOSPITAL_BASED_OUTPATIENT_CLINIC_OR_DEPARTMENT_OTHER)
Admission: EM | Admit: 2024-09-23 | Discharge: 2024-09-26 | DRG: 663 | Disposition: A | Discharge status: Home / Self Care | Attending: Internal Medicine | Admitting: Internal Medicine

## 2024-09-23 ENCOUNTER — Other Ambulatory Visit: Payer: Self-pay

## 2024-09-23 ENCOUNTER — Encounter (HOSPITAL_BASED_OUTPATIENT_CLINIC_OR_DEPARTMENT_OTHER): Payer: Self-pay | Admitting: Emergency Medicine

## 2024-09-23 ENCOUNTER — Emergency Department (HOSPITAL_BASED_OUTPATIENT_CLINIC_OR_DEPARTMENT_OTHER)

## 2024-09-23 DIAGNOSIS — Z7902 Long term (current) use of antithrombotics/antiplatelets: Secondary | ICD-10-CM

## 2024-09-23 DIAGNOSIS — Z888 Allergy status to other drugs, medicaments and biological substances status: Secondary | ICD-10-CM

## 2024-09-23 DIAGNOSIS — D509 Iron deficiency anemia, unspecified: Secondary | ICD-10-CM | POA: Diagnosis present

## 2024-09-23 DIAGNOSIS — I10 Essential (primary) hypertension: Secondary | ICD-10-CM | POA: Diagnosis present

## 2024-09-23 DIAGNOSIS — N3289 Other specified disorders of bladder: Principal | ICD-10-CM | POA: Diagnosis present

## 2024-09-23 DIAGNOSIS — K219 Gastro-esophageal reflux disease without esophagitis: Secondary | ICD-10-CM | POA: Diagnosis present

## 2024-09-23 DIAGNOSIS — B961 Klebsiella pneumoniae [K. pneumoniae] as the cause of diseases classified elsewhere: Secondary | ICD-10-CM | POA: Diagnosis present

## 2024-09-23 DIAGNOSIS — K922 Gastrointestinal hemorrhage, unspecified: Secondary | ICD-10-CM

## 2024-09-23 DIAGNOSIS — Z880 Allergy status to penicillin: Secondary | ICD-10-CM

## 2024-09-23 DIAGNOSIS — E66811 Obesity, class 1: Secondary | ICD-10-CM | POA: Diagnosis present

## 2024-09-23 DIAGNOSIS — D62 Acute posthemorrhagic anemia: Secondary | ICD-10-CM | POA: Diagnosis not present

## 2024-09-23 DIAGNOSIS — R310 Gross hematuria: Secondary | ICD-10-CM | POA: Diagnosis not present

## 2024-09-23 DIAGNOSIS — R338 Other retention of urine: Secondary | ICD-10-CM | POA: Diagnosis present

## 2024-09-23 DIAGNOSIS — N400 Enlarged prostate without lower urinary tract symptoms: Secondary | ICD-10-CM | POA: Diagnosis present

## 2024-09-23 DIAGNOSIS — Z8719 Personal history of other diseases of the digestive system: Secondary | ICD-10-CM

## 2024-09-23 DIAGNOSIS — R000 Tachycardia, unspecified: Secondary | ICD-10-CM | POA: Diagnosis present

## 2024-09-23 DIAGNOSIS — E785 Hyperlipidemia, unspecified: Secondary | ICD-10-CM | POA: Diagnosis present

## 2024-09-23 DIAGNOSIS — Y846 Urinary catheterization as the cause of abnormal reaction of the patient, or of later complication, without mention of misadventure at the time of the procedure: Secondary | ICD-10-CM | POA: Diagnosis present

## 2024-09-23 DIAGNOSIS — T8383XA Hemorrhage of genitourinary prosthetic devices, implants and grafts, initial encounter: Principal | ICD-10-CM | POA: Diagnosis present

## 2024-09-23 DIAGNOSIS — Z955 Presence of coronary angioplasty implant and graft: Secondary | ICD-10-CM

## 2024-09-23 DIAGNOSIS — T8384XA Pain from genitourinary prosthetic devices, implants and grafts, initial encounter: Secondary | ICD-10-CM | POA: Diagnosis present

## 2024-09-23 DIAGNOSIS — E876 Hypokalemia: Secondary | ICD-10-CM | POA: Diagnosis present

## 2024-09-23 DIAGNOSIS — Z6830 Body mass index (BMI) 30.0-30.9, adult: Secondary | ICD-10-CM

## 2024-09-23 DIAGNOSIS — Z87891 Personal history of nicotine dependence: Secondary | ICD-10-CM

## 2024-09-23 DIAGNOSIS — Z8249 Family history of ischemic heart disease and other diseases of the circulatory system: Secondary | ICD-10-CM

## 2024-09-23 DIAGNOSIS — I2510 Atherosclerotic heart disease of native coronary artery without angina pectoris: Secondary | ICD-10-CM | POA: Diagnosis present

## 2024-09-23 DIAGNOSIS — Z8679 Personal history of other diseases of the circulatory system: Secondary | ICD-10-CM

## 2024-09-23 DIAGNOSIS — I959 Hypotension, unspecified: Secondary | ICD-10-CM | POA: Diagnosis present

## 2024-09-23 DIAGNOSIS — B9689 Other specified bacterial agents as the cause of diseases classified elsewhere: Secondary | ICD-10-CM | POA: Insufficient documentation

## 2024-09-23 DIAGNOSIS — N401 Enlarged prostate with lower urinary tract symptoms: Secondary | ICD-10-CM | POA: Diagnosis present

## 2024-09-23 DIAGNOSIS — Z87892 Personal history of anaphylaxis: Secondary | ICD-10-CM

## 2024-09-23 DIAGNOSIS — Z91013 Allergy to seafood: Secondary | ICD-10-CM

## 2024-09-23 DIAGNOSIS — R319 Hematuria, unspecified: Secondary | ICD-10-CM

## 2024-09-23 DIAGNOSIS — N390 Urinary tract infection, site not specified: Secondary | ICD-10-CM | POA: Diagnosis present

## 2024-09-23 DIAGNOSIS — R739 Hyperglycemia, unspecified: Secondary | ICD-10-CM | POA: Diagnosis present

## 2024-09-23 DIAGNOSIS — Z79899 Other long term (current) drug therapy: Secondary | ICD-10-CM

## 2024-09-23 LAB — CBC WITH DIFFERENTIAL/PLATELET
Abs Immature Granulocytes: 0.05 K/uL (ref 0.00–0.07)
Basophils Absolute: 0 K/uL (ref 0.0–0.1)
Basophils Relative: 0 %
Eosinophils Absolute: 0 K/uL (ref 0.0–0.5)
Eosinophils Relative: 0 %
HCT: 30.1 % — ABNORMAL LOW (ref 39.0–52.0)
Hemoglobin: 9.5 g/dL — ABNORMAL LOW (ref 13.0–17.0)
Immature Granulocytes: 1 %
Lymphocytes Relative: 5 %
Lymphs Abs: 0.5 K/uL — ABNORMAL LOW (ref 0.7–4.0)
MCH: 25.3 pg — ABNORMAL LOW (ref 26.0–34.0)
MCHC: 31.6 g/dL (ref 30.0–36.0)
MCV: 80.3 fL (ref 80.0–100.0)
Monocytes Absolute: 0.8 K/uL (ref 0.1–1.0)
Monocytes Relative: 8 %
Neutro Abs: 9.2 K/uL — ABNORMAL HIGH (ref 1.7–7.7)
Neutrophils Relative %: 86 %
Platelets: 211 K/uL (ref 150–400)
RBC: 3.75 MIL/uL — ABNORMAL LOW (ref 4.22–5.81)
RDW: 14.4 % (ref 11.5–15.5)
WBC: 10.6 K/uL — ABNORMAL HIGH (ref 4.0–10.5)
nRBC: 0 % (ref 0.0–0.2)

## 2024-09-23 LAB — RETICULOCYTES
Immature Retic Fract: 16.4 % — ABNORMAL HIGH (ref 2.3–15.9)
RBC.: 3.09 MIL/uL — ABNORMAL LOW (ref 4.22–5.81)
Retic Count, Absolute: 80.3 K/uL (ref 19.0–186.0)
Retic Ct Pct: 2.6 % (ref 0.4–3.1)

## 2024-09-23 LAB — COMPREHENSIVE METABOLIC PANEL WITH GFR
ALT: 12 U/L (ref 0–44)
AST: 13 U/L — ABNORMAL LOW (ref 15–41)
Albumin: 3.8 g/dL (ref 3.5–5.0)
Alkaline Phosphatase: 43 U/L (ref 38–126)
Anion gap: 13 (ref 5–15)
BUN: 14 mg/dL (ref 8–23)
CO2: 23 mmol/L (ref 22–32)
Calcium: 9.2 mg/dL (ref 8.9–10.3)
Chloride: 104 mmol/L (ref 98–111)
Creatinine, Ser: 1.19 mg/dL (ref 0.61–1.24)
GFR, Estimated: >60 mL/min
Glucose, Bld: 180 mg/dL — ABNORMAL HIGH (ref 70–99)
Potassium: 3.3 mmol/L — ABNORMAL LOW (ref 3.5–5.1)
Sodium: 139 mmol/L (ref 135–145)
Total Bilirubin: 0.7 mg/dL (ref 0.0–1.2)
Total Protein: 6.3 g/dL — ABNORMAL LOW (ref 6.5–8.1)

## 2024-09-23 LAB — CBC
HCT: 25.5 % — ABNORMAL LOW (ref 39.0–52.0)
Hemoglobin: 7.9 g/dL — ABNORMAL LOW (ref 13.0–17.0)
MCH: 25.8 pg — ABNORMAL LOW (ref 26.0–34.0)
MCHC: 31 g/dL (ref 30.0–36.0)
MCV: 83.3 fL (ref 80.0–100.0)
Platelets: 186 K/uL (ref 150–400)
RBC: 3.06 MIL/uL — ABNORMAL LOW (ref 4.22–5.81)
RDW: 14.6 % (ref 11.5–15.5)
WBC: 8.5 K/uL (ref 4.0–10.5)
nRBC: 0 % (ref 0.0–0.2)

## 2024-09-23 LAB — MAGNESIUM: Magnesium: 2.1 mg/dL (ref 1.7–2.4)

## 2024-09-23 LAB — IRON AND TIBC
Iron: 14 ug/dL — ABNORMAL LOW (ref 45–182)
Saturation Ratios: 4 % — ABNORMAL LOW (ref 17.9–39.5)
TIBC: 329 ug/dL (ref 250–450)
UIBC: 315 ug/dL

## 2024-09-23 LAB — FERRITIN: Ferritin: 66 ng/mL (ref 24–336)

## 2024-09-23 LAB — PROTIME-INR
INR: 1 (ref 0.8–1.2)
Prothrombin Time: 13.3 s (ref 11.4–15.2)

## 2024-09-23 LAB — VITAMIN B12: Vitamin B-12: 254 pg/mL (ref 180–914)

## 2024-09-23 LAB — FOLATE: Folate: 14.1 ng/mL

## 2024-09-23 MED ORDER — MORPHINE SULFATE (PF) 2 MG/ML IV SOLN
2.0000 mg | INTRAVENOUS | Status: DC | PRN
  Administered 2024-09-23 (×2): 2 mg via INTRAVENOUS
  Filled 2024-09-23 (×2): qty 1

## 2024-09-23 MED ORDER — LIDOCAINE HCL URETHRAL/MUCOSAL 2 % EX GEL
1.0000 | Freq: Once | CUTANEOUS | Status: AC
  Administered 2024-09-23: 1 via URETHRAL
  Filled 2024-09-23: qty 11

## 2024-09-23 MED ORDER — TAMSULOSIN HCL 0.4 MG PO CAPS
0.4000 mg | ORAL_CAPSULE | Freq: Every day | ORAL | Status: DC
  Administered 2024-09-23 – 2024-09-26 (×3): 0.4 mg via ORAL
  Filled 2024-09-23 (×4): qty 1

## 2024-09-23 MED ORDER — MORPHINE SULFATE (PF) 2 MG/ML IV SOLN
2.0000 mg | Freq: Once | INTRAVENOUS | Status: AC
  Administered 2024-09-23: 2 mg via INTRAVENOUS
  Filled 2024-09-23: qty 1

## 2024-09-23 MED ORDER — IOHEXOL 300 MG/ML  SOLN
100.0000 mL | Freq: Once | INTRAMUSCULAR | Status: AC | PRN
  Administered 2024-09-23: 100 mL via INTRAVENOUS

## 2024-09-23 MED ORDER — SODIUM CHLORIDE 0.9 % IR SOLN
3000.0000 mL | Status: DC
  Administered 2024-09-23 – 2024-09-24 (×5): 3000 mL

## 2024-09-23 MED ORDER — CHLORHEXIDINE GLUCONATE CLOTH 2 % EX PADS
6.0000 | MEDICATED_PAD | Freq: Every day | CUTANEOUS | Status: DC
  Administered 2024-09-23 – 2024-09-26 (×3): 6 via TOPICAL

## 2024-09-23 MED ORDER — POTASSIUM CHLORIDE CRYS ER 20 MEQ PO TBCR
60.0000 meq | EXTENDED_RELEASE_TABLET | Freq: Once | ORAL | Status: AC
  Administered 2024-09-23: 60 meq via ORAL
  Filled 2024-09-23: qty 3

## 2024-09-23 MED ORDER — FINASTERIDE 5 MG PO TABS
5.0000 mg | ORAL_TABLET | Freq: Every day | ORAL | Status: DC
  Administered 2024-09-23 – 2024-09-25 (×3): 5 mg via ORAL
  Filled 2024-09-23 (×3): qty 1

## 2024-09-23 NOTE — ED Provider Notes (Signed)
 " [place] EMERGENCY DEPARTMENT AT [facility] Provider Note   CSN: [id number] Arrival date & time: 09/23/24  9188     History Chief Complaint  Patient presents with   Hematuria    HPI: Ruben Rodriguez is Rodriguez 78 y.o. male with history pertinent for BPH, urinary retention, GERD, hypertension, hyperlipidemia, CAD on Plavix  who presents complaining of hematuria. Patient arrived via POV accompanied by wife, Ruben Rodriguez.  History provided by patient.  No interpreter required during this encounter.  Patient reports that on 12/3 he was hospitalized for Rodriguez GI bleed.  Reports that he is not currently having any GI bleed symptoms, however during his imaging for the workup for his GI bleed he was noted to have significant urinary retention.  Reports that he had an indwelling Foley catheter until 12/15 when the Foley was DC'd by Rodriguez urology PA after evaluation in clinic, and since then patient has been performing intermittent self catheterizations at home.  Reports that over the past 5 days since 12/25 he has had hematuria and difficulty and pain with insertion of the catheter.  Endorses passage of clots during intermittent self caths as well.  He is not able to void spontaneously without the use of the catheter at all.  Reports that last night he had sensation that he needed to void, and was unable to pass the catheter and had significant pain.  Reports that this morning around 5 AM he was able to insert the catheter, however had an increasing amount of pain, thus wanted to come to the emergency department for further evaluation.  Patient's recorded medical, surgical, social, medication list and allergies were reviewed in the Snapshot window as part of the initial history.   Prior to Admission medications  Medication Sig Start Date End Date Taking? Authorizing Provider  acetaminophen  (TYLENOL ) 650 MG CR tablet Take 1,300 mg by mouth every 8 (eight) hours as needed for pain.    [provider]   amLODipine  (NORVASC ) 10 MG tablet Take 10 mg by mouth daily.    [provider]  bisoprolol  (ZEBETA ) 10 MG tablet Take 10 mg by mouth daily.    [provider]  clopidogrel  (PLAVIX ) 75 MG tablet TAKE 1 TABLET BY MOUTH EVERY DAY 07/01/24   Ruben Redell GORMAN, MD  diphenhydrAMINE  (BENADRYL ) 25 MG tablet Take 25 mg by mouth daily as needed for itching.    [provider]  ferrous sulfate  325 (65 FE) MG tablet Take 1 tablet (325 mg total) by mouth daily with breakfast. 09/03/24   Rodriguez, Ruben A, MD  finasteride  (PROSCAR ) 5 MG tablet Take 5 mg by mouth at bedtime.    [provider]  fluocinonide ointment (LIDEX) 0.05 % Apply 1 Application topically daily as needed (dryness inside ears).    [provider]  nitroGLYCERIN  (NITROSTAT ) 0.4 MG SL tablet Place 1 tablet (0.4 mg total) under the tongue every 5 (five) minutes as needed. 08/31/23   Ruben Manuelita NOVAK, NP  pantoprazole  (PROTONIX ) 40 MG tablet Take 1 tablet (40 mg total) by mouth 2 (two) times daily. 06/02/23   Rodriguez, Ruben U, DO  polyethylene glycol (MIRALAX  / GLYCOLAX ) 17 g packet Take 17 g by mouth daily. 09/03/24   Rodriguez, Ruben A, MD  Polyvinyl Alcohol-Povidone (REFRESH OP) Place 1 drop into both eyes daily as needed (dry eyes).    [provider]  tamsulosin  (FLOMAX ) 0.4 MG CAPS capsule Take 1 capsule (0.4 mg total) by mouth daily. 09/03/24   Rodriguez,  Ruben A, MD     Allergies: Fish allergy, Penicillins, Repatha  [evolocumab ], Atorvastatin, Ezetimibe, Zocor [simvastatin], Amoxicillin, and Crestor  [rosuvastatin  calcium ]   Review of Systems   ROS as per HPI  Physical Exam Updated Vital Signs BP (!) 110/57   Pulse 84   Temp 98.7 F (37.1 C) (Oral)   Resp 18   Wt 93.4 kg   SpO2 99%   BMI 30.42 kg/m  Physical Exam Vitals and nursing note reviewed.  Constitutional:      General: He is not in acute distress.    Appearance: He is well-developed.  HENT:     Head: Normocephalic  and atraumatic.  Eyes:     Conjunctiva/sclera: Conjunctivae normal.  Cardiovascular:     Rate and Rhythm: Regular rhythm. Tachycardia present.     Heart sounds: No murmur heard. Pulmonary:     Effort: Pulmonary effort is normal. No respiratory distress.     Breath sounds: Normal breath sounds.  Abdominal:     Palpations: Abdomen is soft.     Tenderness: There is abdominal tenderness (Suprapubic).  Musculoskeletal:        General: No swelling.     Cervical back: Neck supple.  Skin:    General: Skin is warm and dry.     Capillary Refill: Capillary refill takes less than 2 seconds.  Neurological:     Mental Status: He is alert.  Psychiatric:        Mood and Affect: Mood normal.     ED Course/ Medical Decision Making/ Rodriguez&P    Procedures Procedures   Medications Ordered in ED Medications  morphine  (PF) 2 MG/ML injection 2 mg (2 mg Intravenous Given 09/23/24 1345)  morphine  (PF) 2 MG/ML injection 2 mg (2 mg Intravenous Given 09/23/24 0856)  iohexol  (OMNIPAQUE ) 300 MG/ML solution 100 mL (100 mLs Intravenous Contrast Given 09/23/24 1144)  lidocaine  (XYLOCAINE ) 2 % jelly 1 Application (1 Application Urethral Given 09/23/24 1243)    Medical Decision Making:   Ruben Rodriguez is Rodriguez 78 y.o. male who presents for hematuria, difficulty and pain with Foley insertion as per above.  Physical exam is pertinent for tachycardia, suprapubic tenderness.   The differential includes but is not limited to loss anemia, obstructive AKI, urinary retention, bladder/urethral trauma, significant clot burden.  Independent historian: Spouse/partner  External data reviewed: Labs: reviewed prior labs for baseline and Notes: Reviewed prior notes from previous admission for GI bleed  Initial Plan:  Screening labs including CBC and Metabolic panel to evaluate for infectious or metabolic etiology of disease.  Screening coags given bleeding and history of Plavix  use Urinalysis with reflex culture ordered to  evaluate for UTI or relevant urologic/nephrologic pathology.  Bladder ultrasound to evaluate for clot burden within bladder EKG to evaluate for cardiac pathology Objective evaluation as below reviewed   Labs: Ordered, Independent interpretation, and Details: Coags reassuring.  CBC wit  Radiology: Ordered, Independent interpretation, Details: Personally viewed ultrasound, I appreciate complex echogenicity lesion within the bladder concerning for mass versus complex blood clot, CT reveals similar complex hyperdense mass in the bladder concerning for clot, and All images reviewed independently.  Agree with radiology report at this time.   CT PELVIS W CONTRAST Result Date: 09/23/2024 EXAM: CT PELVIS, WITH IV CONTRAST 09/23/2024 12:00:50 PM TECHNIQUE: Axial images were acquired through the pelvis with IV contrast. Reformatted images were reviewed. Automated exposure control, iterative reconstruction, and/or weight based adjustment of the mA/kV was utilized to reduce the radiation dose to as low as  reasonably achievable. COMPARISON: Ultrasound pelvis 09/23/24 CLINICAL HISTORY: eval bladder mass eval bladder mass FINDINGS: BONES: Surgical hardware of the lumbar spine partially visualized. No acute fracture or focal osseous lesion. JOINTS: No dislocation. The joint spaces are normal. SOFT TISSUES: Tiny fat-containing umbilical hernia. INTRAPELVIC CONTENTS: Redemonstration of distended urinary bladder lumen with urine as well as heterogeneous hyperdense mass-like lesion measuring 9.5 x 9.5 x 9.5 cm. Associated foci of gas within this lesion that may represent Rodriguez mass versus blood clot. No urinary bladder wall thickening. No gas within the urinary bladder wall. Prominent prostate measuring up to 4.7 cm. No hydroureter of the visualized distal ureters. Colonic diverticulosis. No bowel thickening or dilatation of the visualized portions of the small and large bowel. No free fluid or gas within the peritoneum. No  organized fluid collection. IMPRESSION: 1. Distended urinary bladder with urine as well as Rodriguez 9.5 x 9.5 x 9.5 cm heterogeneous hyperdense intraluminal mass-like lesion containing gas, which may represent tumor versus blood clot. 2. No urinary bladder wall thickening or intramural gas. No hydroureter of the visualized distal ureters. Electronically signed by: Morgane Naveau MD 09/23/2024 12:14 PM EST RP Workstation: [id number]   US  Pelvis Limited Result Date: 09/23/2024 EXAM: PELVIC ULTRASOUND TECHNIQUE: Transabdominal pelvic duplex ultrasound using B-mode/gray scaled imaging with Doppler spectral analysis and color flow was obtained. COMPARISON: CTA GI bleed on 08/29/2024, CT abdomen and pelvis on 10/30/2022. CLINICAL HISTORY: Eval for urinary retention, clot burden. FINDINGS: ULTRASOUND FINDINGS: URINARY BLADDER: Prevoid volume 1099 ml. Postvoid volume - unable to void. Mass-like heterogeneous echogenic lesion within the urinary bladder lumen measuring 10 x 7 x 8 cm, consistent with Rodriguez volume of 320 ml. PROSTATE: The prostate measures 6 x 4.2 x 6.3 cm with Rodriguez volume of 83 ml, which is enlarged. FREE FLUID: No free fluid. IMPRESSION: 1. Marked urinary bladder distention with inability to void on postvoid assessment. 2. Mass-like heterogeneous echogenic lesion within the urinary bladder lumen measuring 10 x 7 x 8 cm (approximately 320 mL), compatible with reported clot burden. Electronically signed by: Morgane Naveau MD 09/23/2024 12:08 PM EST RP Workstation: [id number]   CT ANGIO GI BLEED Result Date: 08/29/2024 CLINICAL DATA:  Blood in stool.  Abdominal pain. EXAM: CTA ABDOMEN AND PELVIS WITHOUT AND WITH CONTRAST TECHNIQUE: Multidetector CT imaging of the abdomen and pelvis was performed using the standard protocol during bolus administration of intravenous contrast. Multiplanar reconstructed images and MIPs were obtained and reviewed to evaluate the vascular anatomy. RADIATION DOSE REDUCTION: This exam was  performed according to the departmental dose-optimization program which includes automated exposure control, adjustment of the mA and/or kV according to patient size and/or use of iterative reconstruction technique. CONTRAST:  OMNIPAQUE  IOHEXOL  350 MG/ML SOLN COMPARISON:  CT 05/29/2023 FINDINGS: VASCULAR Aorta: Normal caliber aorta without aneurysm, dissection, vasculitis or significant stenosis. Moderate atherosclerosis. Celiac: Patent without evidence of aneurysm, dissection, vasculitis or significant stenosis. SMA: Plaque at the origin of the SMA causes less than 50% luminal stenosis. No aneurysm, dissection, or vasculitis. Renals: Both renal arteries are patent without evidence of aneurysm, dissection, vasculitis, fibromuscular dysplasia or significant stenosis. IMA: Patent without evidence of aneurysm, dissection, vasculitis or significant stenosis. Inflow: Patent without evidence of aneurysm, dissection, vasculitis or significant stenosis. Proximal Outflow: Bilateral common femoral and visualized portions of the superficial and profunda femoral arteries are patent without evidence of aneurysm, dissection, vasculitis or significant stenosis. Veins: Venous phase imaging demonstrates patency of the IVC and iliac veins. Portal, splenic, and mesenteric veins are patent.  No portal venous or mesenteric gas. Review of the MIP images confirms the above findings. NON-VASCULAR Lower chest: The heart is mildly enlarged. No basilar airspace disease or pleural effusion. Hepatobiliary: Stable cyst in the left lobe of the liver. No further follow-up imaging is recommended. No suspicious liver lesion. Noncalcified gallstone in the gallbladder. No pericholecystic inflammation. No biliary dilatation. Pancreas: No ductal dilatation or inflammation. Spleen: Normal in size without focal abnormality. Adrenals/Urinary Tract: No adrenal nodule. No hydronephrosis. No evidence of renal inflammation. Bilateral renal cysts. No  further follow-up imaging is recommended. The urinary bladder is prominently distended. Bladder dome diverticula again seen. No bladder wall thickening. Stomach/Bowel: Findings suspicious for active bleed with contrast accumulation within redundant sigmoid colon, series 5, image 119. This increases on venous phase imaging, series 11 images 47 through 52. There are multiple colonic diverticula in this region as well as throughout the colon, but no discretely inflamed diverticula. Moderate volume of colonic stool in the proximal colon. No small bowel obstruction or inflammation. Areas of hyperdensity within the mid small bowel are present on precontrast exam and consistent with ingested material. The stomach is nondistended. Lymphatic: No lymphadenopathy. Reproductive: Enlarged prostate gland causes mass effect on the bladder base. Other: No free air or ascites. No abdominopelvic collection. Small fat containing bilateral inguinal hernias, left greater than right. Small fat containing umbilical hernia. Musculoskeletal: Postsurgical change L3 through L5. Intact hardware. 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 There are no acute or suspicious osseous abnormalities. IMPRESSION: VASCULAR 1. Findings suspicious for active bleed within redundant sigmoid colon. 2.  Aortic Atherosclerosis (ICD10-I70.0). NON-VASCULAR 1. Diffuse colonic diverticulosis without focal diverticulitis. 2. Gallstone without cholecystitis. 3. Enlarged prostate gland causing mass effect on the bladder base. Prominently distended urinary bladder with bladder diverticula, query chronic bladder outlet obstruction. Electronically Signed   By: Andrea Gasman M.D.   On: 08/29/2024 15:25    EKG/Medicine tests: Ordered and Independent interpretation EKG Interpretation:    Interventions: Morphine   See the EMR for full details regarding lab and imaging results.  Patient presents for hematuria and inability to and pain with attempts to pass  In-N-Out catheter at home.  Patient overall well appearing on exam, does have suprapubic tenderness.  Labs ordered as well as ultrasound to evaluate for clot burden.  Labs overall reassuring, no worsening of chronic anemia.  Ultrasound reveals mass in bladder, however does not layer at the posterior aspect of the bladder typical of blood clot, therefore CT obtained and reveals tumor versus large coagulated blood clot within the pelvis.  Consulted urology, spoke with Gwenlyn Bourdon, NP with urology who recommended ED to ED transfer for urology evaluation for bedside irrigation and potentially or intervention if they are unable to irrigate the clot at bedside.  22 French hematuria catheter placed and unfortunately could not drain fluid, nor clot from bladder.  Attempted to drain present urine from bladder with 16 French coud catheter, and also unable to pass coud catheter.  Dr. Doretha at [facility], ED accepted for ED to ED transfer.  Presentation is most consistent with acute complicated illness and Current presentation is complicated by underlying chronic conditions  Discussion of management or test interpretations with external provider(s): Gwenlyn Bourdon, NP, urology  Risk Drugs:Prescription drug management and Parenteral controlled substances Treatment: Transfer to [facility] for urology evaluation  Disposition: Transfer to [facility], ED for further evaluation.  MDM generated using voice dictation software and may contain dictation errors.  Please contact me for any clarification  or with any questions.  Clinical Impression:  1. Blood clot in bladder      Transfer via Transport   Final Clinical Impression(s) / ED Diagnoses Final diagnoses:  Blood clot in bladder    Rx / DC Orders ED Discharge Orders     None        Rogelia Jerilynn RAMAN, MD 09/23/24 1604  "

## 2024-09-23 NOTE — ED Notes (Signed)
 Report given to [facility].

## 2024-09-23 NOTE — ED Triage Notes (Addendum)
 Pt endorses abd pain x 5 days hematuria x 4 days. Pt has been self cathing, and had difficulty advancing catheter last night, able to self cath today. Recently dx with GI bleed and admitted. Pt endorses plavix , last dose yesterday Pt c/o dysuria, and urinary urgency

## 2024-09-23 NOTE — ED Notes (Signed)
 Report given to the Charge at [facility].[email or web address]

## 2024-09-23 NOTE — Consult Note (Addendum)
 "  Urology Consult Note   Requesting Attending Physician:  Patsey Lot, MD Service Providing Consult: Urology  Consulting Attending: Dr. Watt   Reason for Consult:  Gross Hematuria  HPI: Ruben Rodriguez is seen in consultation for reasons noted above at the request of Patsey Lot, MD. Patient is a 78 y.o. male presenting in transfer from OSH with gross hematuria and clot obstruction of urine.  Patient is known to our practice and was recently seen on 12/15.  Having failed multiple voiding trials he was started on clean intermittent catheterization with plans for urodynamic study later this week.  Patient initially reports successful catheter placement but over the last couple days has had increasing difficulty placing Foley's.  He eventually experienced bleeding and presented to the hospital where CT imaging reflected 9.5 x 9.5 x 9.5 cm bladder mass likely representing clot material.  On my arrival patient was alert, oriented, no distress.  He was accompanied by his wife.  We reviewed the case and plan and consent was provided to proceed with attempted catheterization with probable cystoscopy.  Please see separate procedure note  ------------------  Assessment:   78 y.o. male with gross hematuria and clot obstruction of urine on Plavix , recently started CIC with increasing difficulty placing catheter   Recommendations: # Gross hematuria # Clot obstruction of urine # BPH  Large 4.7cm prostate on CT imaging 12/29  Notable swelling of prostatic tissue but no false passage or injury appreciated on cystoscopy.  Please see separate procedure note.  18f three-way hematuria coud placed.  CBI on low-medium gtt.  Hold Plavix  if medically reasonable  Nursing to hand irrigate as needed  Will continue to monitor  Case and plan discussed with Dr. Watt  Past Medical History: Past Medical History:  Diagnosis Date   Arthritis    BPH (benign prostatic hyperplasia)    Chronic back  pain    Family history of adverse reaction to anesthesia    father died during carotid artery surgery   GERD (gastroesophageal reflux disease)    Hyperlipidemia    Hypertension    Pneumonia    Spinal stenosis     Past Surgical History:  Past Surgical History:  Procedure Laterality Date   BIOPSY  05/31/2023   Procedure: BIOPSY;  Surgeon: Dianna Specking, MD;  Location: [facility] ENDOSCOPY;  Service: Gastroenterology;;   COLONOSCOPY     CORONARY PRESSURE/FFR WITH 3D MAPPING N/A 08/31/2023   Procedure: Coronary Pressure/FFR w/3D Mapping;  Surgeon: Wonda Sharper, MD;  Location: [facility] INVASIVE CV LAB;  Service: Cardiovascular;  Laterality: N/A;   CORONARY STENT INTERVENTION N/A 08/31/2023   Procedure: CORONARY STENT INTERVENTION;  Surgeon: Wonda Sharper, MD;  Location: [facility] INVASIVE CV LAB;  Service: Cardiovascular;  Laterality: N/A;   ESOPHAGOGASTRODUODENOSCOPY (EGD) WITH PROPOFOL  N/A 05/31/2023   Procedure: ESOPHAGOGASTRODUODENOSCOPY (EGD) WITH PROPOFOL ;  Surgeon: Dianna Specking, MD;  Location: [facility] ENDOSCOPY;  Service: Gastroenterology;  Laterality: N/A;   FINGER SURGERY     to remove a tumor from right little finger   LAMINECTOMY WITH POSTERIOR LATERAL ARTHRODESIS LEVEL 2 N/A 08/01/2018   Procedure: LUMBAR THREE- LUMBAR FOUR, LUMBAR FOUR-LUMBAR FIVE DECOMPRESSION, LUMBAR THREE-LUMBAR FOUR, LUMBAR FOUR-LUMBAR FIVE POSTEROLATERAL ARTHRODESIS;  Surgeon: Alix Charleston, MD;  Location: [facility] OR;  Service: Neurosurgery;  Laterality: N/A;   LEFT HEART CATH AND CORONARY ANGIOGRAPHY N/A 08/27/2021   Procedure: LEFT HEART CATH AND CORONARY ANGIOGRAPHY;  Surgeon: Wonda Sharper, MD;  Location: [facility] INVASIVE CV LAB;  Service: Cardiovascular;  Laterality: N/A;   LEFT  HEART CATH AND CORONARY ANGIOGRAPHY N/A 08/31/2023   Procedure: LEFT HEART CATH AND CORONARY ANGIOGRAPHY;  Surgeon: Wonda Sharper, MD;  Location: [facility] INVASIVE CV LAB;  Service: Cardiovascular;  Laterality: N/A;    Medication: Current  Facility-Administered Medications  Medication Dose Route Frequency Provider Last Rate Last Admin   morphine  (PF) 2 MG/ML injection 2 mg  2 mg Intravenous Q2H PRN Rogelia Jerilynn RAMAN, MD   2 mg at 09/23/24 1345   Current Outpatient Medications  Medication Sig Dispense Refill   acetaminophen  (TYLENOL ) 650 MG CR tablet Take 1,300 mg by mouth every 8 (eight) hours as needed for pain.     amLODipine  (NORVASC ) 10 MG tablet Take 10 mg by mouth daily.     bisoprolol  (ZEBETA ) 10 MG tablet Take 10 mg by mouth daily.     clopidogrel  (PLAVIX ) 75 MG tablet TAKE 1 TABLET BY MOUTH EVERY DAY 90 tablet 0   diphenhydrAMINE  (BENADRYL ) 25 MG tablet Take 25 mg by mouth daily as needed for itching.     ferrous sulfate  325 (65 FE) MG tablet Take 1 tablet (325 mg total) by mouth daily with breakfast. 30 tablet 3   finasteride  (PROSCAR ) 5 MG tablet Take 5 mg by mouth at bedtime.     fluocinonide ointment (LIDEX) 0.05 % Apply 1 Application topically daily as needed (dryness inside ears).     nitroGLYCERIN  (NITROSTAT ) 0.4 MG SL tablet Place 1 tablet (0.4 mg total) under the tongue every 5 (five) minutes as needed. 25 tablet 2   pantoprazole  (PROTONIX ) 40 MG tablet Take 1 tablet (40 mg total) by mouth 2 (two) times daily. 240 tablet 0   polyethylene glycol (MIRALAX  / GLYCOLAX ) 17 g packet Take 17 g by mouth daily. 14 each 0   Polyvinyl Alcohol-Povidone (REFRESH OP) Place 1 drop into both eyes daily as needed (dry eyes).     tamsulosin  (FLOMAX ) 0.4 MG CAPS capsule Take 1 capsule (0.4 mg total) by mouth daily. 30 capsule 0    Allergies: Allergies[1]  Social History: Social History[2]  Family History Family History  Problem Relation Age of Onset   Hypertension Mother     ROS   Objective   Vital signs in last 24 hours: BP (!) 110/57   Pulse 84   Temp 98.7 F (37.1 C) (Oral)   Resp 18   Wt 93.4 kg   SpO2 99%   BMI 30.42 kg/m   Physical Exam General: A&O, resting, appropriate HEENT: [place]/AT Pulmonary:  Normal work of breathing Cardiovascular: no cyanosis GU: 20F three-way hematuria coud in place with low-medium gtt. and light pink irrigant   Most Recent Labs: Lab Results  Component Value Date   WBC 10.6 (H) 09/23/2024   HGB 9.5 (L) 09/23/2024   HCT 30.1 (L) 09/23/2024   PLT 211 09/23/2024    Lab Results  Component Value Date   NA 139 09/23/2024   K 3.3 (L) 09/23/2024   CL 104 09/23/2024   CO2 23 09/23/2024   BUN 14 09/23/2024   CREATININE 1.19 09/23/2024   CALCIUM  9.2 09/23/2024    Lab Results  Component Value Date   INR 1.0 09/23/2024     Urine Culture: @[id number] (laburin,org,[id number],[id number])@   IMAGING: CT PELVIS W CONTRAST Result Date: 09/23/2024 EXAM: CT PELVIS, WITH IV CONTRAST 09/23/2024 12:00:50 PM TECHNIQUE: Axial images were acquired through the pelvis with IV contrast. Reformatted images were reviewed. Automated exposure control, iterative reconstruction, and/or weight based adjustment of the mA/kV was utilized to reduce the radiation dose  to as low as reasonably achievable. COMPARISON: Ultrasound pelvis 09/23/24 CLINICAL HISTORY: eval bladder mass eval bladder mass FINDINGS: BONES: Surgical hardware of the lumbar spine partially visualized. No acute fracture or focal osseous lesion. JOINTS: No dislocation. The joint spaces are normal. SOFT TISSUES: Tiny fat-containing umbilical hernia. INTRAPELVIC CONTENTS: Redemonstration of distended urinary bladder lumen with urine as well as heterogeneous hyperdense mass-like lesion measuring 9.5 x 9.5 x 9.5 cm. Associated foci of gas within this lesion that may represent a mass versus blood clot. No urinary bladder wall thickening. No gas within the urinary bladder wall. Prominent prostate measuring up to 4.7 cm. No hydroureter of the visualized distal ureters. Colonic diverticulosis. No bowel thickening or dilatation of the visualized portions of the small and large bowel. No free fluid or gas within the peritoneum. No organized  fluid collection. IMPRESSION: 1. Distended urinary bladder with urine as well as a 9.5 x 9.5 x 9.5 cm heterogeneous hyperdense intraluminal mass-like lesion containing gas, which may represent tumor versus blood clot. 2. No urinary bladder wall thickening or intramural gas. No hydroureter of the visualized distal ureters. Electronically signed by: Morgane Naveau MD 09/23/2024 12:14 PM EST RP Workstation: [id number]   US  Pelvis Limited Result Date: 09/23/2024 EXAM: PELVIC ULTRASOUND TECHNIQUE: Transabdominal pelvic duplex ultrasound using B-mode/gray scaled imaging with Doppler spectral analysis and color flow was obtained. COMPARISON: CTA GI bleed on 08/29/2024, CT abdomen and pelvis on 10/30/2022. CLINICAL HISTORY: Eval for urinary retention, clot burden. FINDINGS: ULTRASOUND FINDINGS: URINARY BLADDER: Prevoid volume 1099 ml. Postvoid volume - unable to void. Mass-like heterogeneous echogenic lesion within the urinary bladder lumen measuring 10 x 7 x 8 cm, consistent with a volume of 320 ml. PROSTATE: The prostate measures 6 x 4.2 x 6.3 cm with a volume of 83 ml, which is enlarged. FREE FLUID: No free fluid. IMPRESSION: 1. Marked urinary bladder distention with inability to void on postvoid assessment. 2. Mass-like heterogeneous echogenic lesion within the urinary bladder lumen measuring 10 x 7 x 8 cm (approximately 320 mL), compatible with reported clot burden. Electronically signed by: Morgane Naveau MD 09/23/2024 12:08 PM EST RP Workstation: [id number]    ------  Ole Bourdon, NP Pager: [phone]   Please contact the urology consult pager with any further questions/concerns.      [1]  Allergies Allergen Reactions   Fish Allergy Anaphylaxis and Rash    Tolerates salmon and tuna   Penicillins Anaphylaxis   Repatha  [Evolocumab ] Shortness Of Breath and Rash    Pain, vision problems    Atorvastatin Other (See Comments)    Muscle aches   Ezetimibe Other (See Comments)    Muscle  aches   Zocor [Simvastatin] Other (See Comments)    Muscle aches   Amoxicillin Other (See Comments)    MD advised patient to not take this, either   Crestor  [Rosuvastatin  Calcium ] Other (See Comments)    Joint and muscle pain  [2]  Social History Tobacco Use   Smoking status: Former   Smokeless tobacco: Never  Vaping Use   Vaping status: Never Used  Substance Use Topics   Alcohol use: No    Comment: Occasional   Drug use: No   "

## 2024-09-23 NOTE — ED Provider Notes (Signed)
 Sent from [facility] for hematuria and urology evaluation.  See their note for full HPI.  Patient has arrived to [facility], appears comfortable.  Benign abdomen.  Is feeling okay currently.  Urology made aware of patient's arrival and have presented bedside.  May need to go to the OR.   See ED course for further MDM and disposition.

## 2024-09-23 NOTE — Procedures (Signed)
" ° °  Procedure: flexible cysto bedside   Urology Procedure Note:  78 year old male known to our practice, recently started CIC d/t BPH on Plavix , now with gross hematuria and clot obstruction of urine.   Reviewed case and plan with patient and his wife who consented to attempted Foley catheter placement and cystoscopy if necessary.  Considering his ongoing bleeding, inability for himself to catheterize, or the ED in the outside hospital, shared decision was made to proceed directly to cystoscopic placement of Foley catheter.  Patient was prepped and draped in the usual sterile fashion.  Well-lubricated cystoscope was advanced to the proximal urethra without difficulty.  Notable edema was appreciated at this point but no false passage, ragged tissue, or active bleeding was noted at the time.  I was able to easily navigate into the bladder where a zip wire was placed and the scope offloaded.  I was then able to advance a 59F three-way hematuria coud catheter over wire using Seldinger technique into the bladder without significant resistance.  There was an immediate return of bloody urine.  Having confirmed placement, the retention balloon was inflated with 30 cc of sterile water.  I then proceeded to hand irrigate a liter of sterile water, returning a few hundred mL's  of clot material.  Once no longer returning clot, the catheter was placed to gravity drainage without dependent loops and fastened to the upper thigh with a retention strap.  This concluded the procedure.   Ole Bourdon, NP [facility] Pager: [phone]   "

## 2024-09-23 NOTE — ED Notes (Signed)
 3 way cath attempted by staff - unsuccessful, Provider informed...  Provider requested to try a coude cath... Coude Cath attempted by staff - unsuccessful, provider informed.SABRASABRA

## 2024-09-23 NOTE — ED Notes (Signed)
 ED Provider at bedside.

## 2024-09-23 NOTE — H&P (Signed)
 " History and Physical    Patient: Ruben Rodriguez FMW:[id number] DOB: [birth year] DOA: 09/23/2024 DOS: the patient was seen and examined on 09/23/2024 PCP: Loreli Kins, MD  Patient coming from: Home.  Very active  Chief Complaint:  Chief Complaint  Patient presents with   Hematuria   HPI: Ruben Rodriguez is a 78 y.o. male with PMH of CAD s/p DES to mid LAD on 08/31/2023 currently on Plavix , HTN, GI bleed, BPH and urinary tension presented to ED with abdominal discomfort, hematuria and dysuria.  Recently hospitalized from 12/4-12/8 for GI bleed.  He was evaluated by GI and IR and did not require procedures since hemoglobin remained stable.  At that time, he also had acute on chronic urinary tension for which he was discharged with Foley catheter.  Foley catheter removed on 09/09/2024 at urology office.  He was advised to perform self-catheterization which she has been doing well without issue until 3 days ago.  Patient had difficulty catheterizing himself about 2 AM on Friday but able to catheterize himself later in the morning.  He intermittently noticed some blood in urine when he does self-catheterization.  He had a problem again when he tried to catheterize himself last night.  This morning, he noted dark red blood with small blood clots when he catheterized himself.  He also noted discomfort and pain.  He denies fever or chills.  He is still on Plavix .  Last dose yesterday morning.  He denies chest pain, dyspnea, lightheadedness, nausea, vomiting, melena or hematochezia.  Patient did not take any of his morning medication today.  Patient denies smoking cigarette, drinking alcohol recreational drug use.  He is interested in cardiopulmonary cessation in event of sudden cardiopulmonary arrest.  In ED, initially hypotensive to 83/55 and tachycardic to 129.  Vitals normalized quickly without IV fluid.  Hgb 9.5 (baseline).  WBC 10.6.  K3.3.  Glucose 180.  Otherwise, CMP and CBC without significant  finding.  INR 1.3.  CT pelvis showed 9.5 x 9.5 x 9.5 cm heterogeneous hyperdense intraluminal mass felt to be blood clots.   Patient had cystoscopy ureteroscopy and hand irrigation with normal saline by urology and started on CBI.  Admission requested for gross hematuria  Review of Systems: As mentioned in the history of present illness. All other systems reviewed and are negative. Past Medical History:  Diagnosis Date   Arthritis    BPH (benign prostatic hyperplasia)    Chronic back pain    Family history of adverse reaction to anesthesia    father died during carotid artery surgery   GERD (gastroesophageal reflux disease)    Hyperlipidemia    Hypertension    Pneumonia    Spinal stenosis    Past Surgical History:  Procedure Laterality Date   BIOPSY  05/31/2023   Procedure: BIOPSY;  Surgeon: Dianna Specking, MD;  Location: [facility] ENDOSCOPY;  Service: Gastroenterology;;   COLONOSCOPY     CORONARY PRESSURE/FFR WITH 3D MAPPING N/A 08/31/2023   Procedure: Coronary Pressure/FFR w/3D Mapping;  Surgeon: Wonda Sharper, MD;  Location: [facility] INVASIVE CV LAB;  Service: Cardiovascular;  Laterality: N/A;   CORONARY STENT INTERVENTION N/A 08/31/2023   Procedure: CORONARY STENT INTERVENTION;  Surgeon: Wonda Sharper, MD;  Location: [facility] INVASIVE CV LAB;  Service: Cardiovascular;  Laterality: N/A;   ESOPHAGOGASTRODUODENOSCOPY (EGD) WITH PROPOFOL  N/A 05/31/2023   Procedure: ESOPHAGOGASTRODUODENOSCOPY (EGD) WITH PROPOFOL ;  Surgeon: Dianna Specking, MD;  Location: [facility] ENDOSCOPY;  Service: Gastroenterology;  Laterality: N/A;   FINGER SURGERY  to remove a tumor from right little finger   LAMINECTOMY WITH POSTERIOR LATERAL ARTHRODESIS LEVEL 2 N/A 08/01/2018   Procedure: LUMBAR THREE- LUMBAR FOUR, LUMBAR FOUR-LUMBAR FIVE DECOMPRESSION, LUMBAR THREE-LUMBAR FOUR, LUMBAR FOUR-LUMBAR FIVE POSTEROLATERAL ARTHRODESIS;  Surgeon: Alix Charleston, MD;  Location: [facility] OR;  Service: Neurosurgery;  Laterality: N/A;   LEFT HEART  CATH AND CORONARY ANGIOGRAPHY N/A 08/27/2021   Procedure: LEFT HEART CATH AND CORONARY ANGIOGRAPHY;  Surgeon: Wonda Sharper, MD;  Location: [facility] INVASIVE CV LAB;  Service: Cardiovascular;  Laterality: N/A;   LEFT HEART CATH AND CORONARY ANGIOGRAPHY N/A 08/31/2023   Procedure: LEFT HEART CATH AND CORONARY ANGIOGRAPHY;  Surgeon: Wonda Sharper, MD;  Location: [facility] INVASIVE CV LAB;  Service: Cardiovascular;  Laterality: N/A;   Social History:  reports that he has quit smoking. He has never used smokeless tobacco. He reports that he does not drink alcohol and does not use drugs.  Allergies[1]  Family History  Problem Relation Age of Onset   Hypertension Mother     Prior to Admission medications  Medication Sig Start Date End Date Taking? Authorizing Provider  acetaminophen  (TYLENOL ) 650 MG CR tablet Take 1,300 mg by mouth every 8 (eight) hours as needed for pain.    [provider]  amLODipine  (NORVASC ) 10 MG tablet Take 10 mg by mouth daily.    [provider]  bisoprolol  (ZEBETA ) 10 MG tablet Take 10 mg by mouth daily.    [provider]  clopidogrel  (PLAVIX ) 75 MG tablet TAKE 1 TABLET BY MOUTH EVERY DAY 07/01/24   Pietro Redell RAMAN, MD  diphenhydrAMINE  (BENADRYL ) 25 MG tablet Take 25 mg by mouth daily as needed for itching.    [provider]  ferrous sulfate  325 (65 FE) MG tablet Take 1 tablet (325 mg total) by mouth daily with breakfast. 09/03/24   Regalado, Belkys A, MD  finasteride  (PROSCAR ) 5 MG tablet Take 5 mg by mouth at bedtime.    [provider]  fluocinonide ointment (LIDEX) 0.05 % Apply 1 Application topically daily as needed (dryness inside ears).    [provider]  nitroGLYCERIN  (NITROSTAT ) 0.4 MG SL tablet Place 1 tablet (0.4 mg total) under the tongue every 5 (five) minutes as needed. 08/31/23   Henry Manuelita NOVAK, NP  pantoprazole  (PROTONIX ) 40 MG tablet Take 1 tablet (40 mg total) by mouth 2 (two) times daily. 06/02/23   Vann,  Jessica U, DO  polyethylene glycol (MIRALAX  / GLYCOLAX ) 17 g packet Take 17 g by mouth daily. 09/03/24   Regalado, Belkys A, MD  Polyvinyl Alcohol-Povidone (REFRESH OP) Place 1 drop into both eyes daily as needed (dry eyes).    [provider]  tamsulosin  (FLOMAX ) 0.4 MG CAPS capsule Take 1 capsule (0.4 mg total) by mouth daily. 09/03/24   Madelyne Owen LABOR, MD    Physical Exam: Vitals:   09/23/24 1330 09/23/24 1400 09/23/24 1632 09/23/24 1811  BP: 134/81 (!) 110/57 139/79 (!) 146/81  Pulse: 88 84 79 85  Resp: 19 18 18 18   Temp:   98.6 F (37 C)   TempSrc:   Oral   SpO2: 99% 99% 100% 100%  Weight:       GENERAL: No apparent distress.  Nontoxic. HEENT: MMM.  Vision and hearing grossly intact.  NECK: Supple.  No apparent JVD.  RESP:  No IWOB.  Fair aeration bilaterally. CVS:  RRR. Heart sounds normal.  ABD/GI/GU: BS+. Abd soft, NTND.  CBI. MSK/EXT:   No apparent deformity. Moves extremities. No  edema.  SKIN: no apparent skin lesion or wound NEURO: Awake and alert. Oriented appropriately.  No apparent focal neuro deficit. PSYCH: Calm. Normal affect.  Data Reviewed: See HPI  Assessment and Plan: Gross hematuria-patient noted gross hematuria with self catheterization earlier this morning.  He is on Plavix  for CAD.  Last dose the morning of 12/28.  Hgb 9.5 which is about baseline.  Hemodynamically stable. -S/p cystourethroscopy and hand irrigation with normal saline by urology -CBI per urology. -Continue holding Plavix  -Recheck CBC in the morning  Acute on chronic blood loss anemia: Hgb at baseline.  Expect some drop from hemodilution and blood loss - Recheck CBC in the morning - Check anemia panel - Continue holding Plavix   BPH/chronic urinary tension-does self-catheterization at home - Continue CBI - Resume home Flomax  and Proscar   CAD s/p DES to LAD on 08/31/2023: On Plavix .  Last dose the morning of 12/28. - Will discuss with primary cardiologist if he needs to  stay on Plavix   Essential hypertension Hypotension: Resolved after 3 L bolus in ED. - Hold home antihypertensive meds.  Hypokalemia -Monitor replenish K and Mg as appropriate  Hyperglycemia: No history of diabetes. - Check hemoglobin A1c       Advance Care Planning:   Code Status: Full Code -discussed with patient  Consults: Urology  Family Communication: None at bedside  Severity of Illness: The appropriate patient status for this patient is OBSERVATION. Observation status is judged to be reasonable and necessary in order to provide the required intensity of service to ensure the patient's safety. The patient's presenting symptoms, physical exam findings, and initial radiographic and laboratory data in the context of their medical condition is felt to place them at decreased risk for further clinical deterioration. Furthermore, it is anticipated that the patient will be medically stable for discharge from the hospital within 2 midnights of admission.   Author: Mignon ONEIDA Bump, MD 09/23/2024 6:38 PM  For on call review [email or web address].      [1]  Allergies Allergen Reactions   Evolocumab  Shortness Of Breath, Rash and Other (See Comments)    Repatha - Pain, body aches, vision problems- also   Fish Allergy Anaphylaxis, Rash and Other (See Comments)    Tolerates salmon and tuna   Penicillins Anaphylaxis, Dermatitis and Other (See Comments)    Hospitalized, per patient- happened during childhood (severe reaction)   Atorvastatin Other (See Comments)    Muscle aches   Ezetimibe Other (See Comments)    Muscle aches   Zocor [Simvastatin] Other (See Comments)    Muscle aches   Amoxicillin Other (See Comments)    MD advised patient to not take this, either   Crestor  [Rosuvastatin  Calcium ] Other (See Comments)    Joint and muscle pain   "

## 2024-09-23 NOTE — ED Provider Notes (Signed)
" °  Physical Exam  BP 139/79 (BP Location: Left Arm)   Pulse 79   Temp 98.6 F (37 C) (Oral)   Resp 18   Wt 93.4 kg   SpO2 100%   BMI 30.42 kg/m   Physical Exam  Procedures  Procedures  ED Course / MDM    Medical Decision Making Amount and/or Complexity of Data Reviewed Labs: ordered. Radiology: ordered.  Risk Prescription drug management. Decision regarding hospitalization.   Received in signout.  Hematuria.  Getting scoped catheter placed.  Discussed with urology.  Will require admission to hospital.  Okay to have diet.  Medicine admission.       Patsey Lot, MD 09/23/24 1702  "

## 2024-09-23 NOTE — ED Notes (Signed)
 Called [facility] to transport the patient to [facility]--Dr. Doretha accepting

## 2024-09-24 ENCOUNTER — Telehealth (HOSPITAL_COMMUNITY): Payer: Self-pay | Admitting: Pharmacist

## 2024-09-24 ENCOUNTER — Telehealth (HOSPITAL_COMMUNITY): Payer: Self-pay | Admitting: Pharmacy Technician

## 2024-09-24 DIAGNOSIS — D62 Acute posthemorrhagic anemia: Secondary | ICD-10-CM | POA: Diagnosis present

## 2024-09-24 DIAGNOSIS — N3289 Other specified disorders of bladder: Secondary | ICD-10-CM | POA: Diagnosis present

## 2024-09-24 DIAGNOSIS — E66811 Obesity, class 1: Secondary | ICD-10-CM | POA: Diagnosis present

## 2024-09-24 DIAGNOSIS — E785 Hyperlipidemia, unspecified: Secondary | ICD-10-CM | POA: Diagnosis present

## 2024-09-24 DIAGNOSIS — D509 Iron deficiency anemia, unspecified: Secondary | ICD-10-CM | POA: Insufficient documentation

## 2024-09-24 DIAGNOSIS — Y846 Urinary catheterization as the cause of abnormal reaction of the patient, or of later complication, without mention of misadventure at the time of the procedure: Secondary | ICD-10-CM | POA: Diagnosis present

## 2024-09-24 DIAGNOSIS — R338 Other retention of urine: Secondary | ICD-10-CM | POA: Diagnosis present

## 2024-09-24 DIAGNOSIS — Z8719 Personal history of other diseases of the digestive system: Secondary | ICD-10-CM | POA: Diagnosis not present

## 2024-09-24 DIAGNOSIS — Z955 Presence of coronary angioplasty implant and graft: Secondary | ICD-10-CM | POA: Diagnosis not present

## 2024-09-24 DIAGNOSIS — T8384XA Pain from genitourinary prosthetic devices, implants and grafts, initial encounter: Secondary | ICD-10-CM | POA: Diagnosis present

## 2024-09-24 DIAGNOSIS — D649 Anemia, unspecified: Secondary | ICD-10-CM | POA: Diagnosis not present

## 2024-09-24 DIAGNOSIS — Z7902 Long term (current) use of antithrombotics/antiplatelets: Secondary | ICD-10-CM | POA: Diagnosis not present

## 2024-09-24 DIAGNOSIS — Z8249 Family history of ischemic heart disease and other diseases of the circulatory system: Secondary | ICD-10-CM | POA: Diagnosis not present

## 2024-09-24 DIAGNOSIS — N401 Enlarged prostate with lower urinary tract symptoms: Secondary | ICD-10-CM | POA: Diagnosis present

## 2024-09-24 DIAGNOSIS — Z87892 Personal history of anaphylaxis: Secondary | ICD-10-CM | POA: Diagnosis not present

## 2024-09-24 DIAGNOSIS — Z880 Allergy status to penicillin: Secondary | ICD-10-CM | POA: Diagnosis not present

## 2024-09-24 DIAGNOSIS — Z87891 Personal history of nicotine dependence: Secondary | ICD-10-CM | POA: Diagnosis not present

## 2024-09-24 DIAGNOSIS — Z91013 Allergy to seafood: Secondary | ICD-10-CM | POA: Diagnosis not present

## 2024-09-24 DIAGNOSIS — I2510 Atherosclerotic heart disease of native coronary artery without angina pectoris: Secondary | ICD-10-CM | POA: Diagnosis present

## 2024-09-24 DIAGNOSIS — N390 Urinary tract infection, site not specified: Secondary | ICD-10-CM | POA: Diagnosis present

## 2024-09-24 DIAGNOSIS — T8383XA Hemorrhage of genitourinary prosthetic devices, implants and grafts, initial encounter: Secondary | ICD-10-CM | POA: Diagnosis present

## 2024-09-24 DIAGNOSIS — B961 Klebsiella pneumoniae [K. pneumoniae] as the cause of diseases classified elsewhere: Secondary | ICD-10-CM | POA: Diagnosis present

## 2024-09-24 DIAGNOSIS — I10 Essential (primary) hypertension: Secondary | ICD-10-CM | POA: Diagnosis present

## 2024-09-24 DIAGNOSIS — R310 Gross hematuria: Secondary | ICD-10-CM | POA: Diagnosis present

## 2024-09-24 DIAGNOSIS — E876 Hypokalemia: Secondary | ICD-10-CM | POA: Diagnosis present

## 2024-09-24 DIAGNOSIS — Z888 Allergy status to other drugs, medicaments and biological substances status: Secondary | ICD-10-CM | POA: Diagnosis not present

## 2024-09-24 DIAGNOSIS — Z6830 Body mass index (BMI) 30.0-30.9, adult: Secondary | ICD-10-CM | POA: Diagnosis not present

## 2024-09-24 LAB — BASIC METABOLIC PANEL WITH GFR
Anion gap: 8 (ref 5–15)
BUN: 22 mg/dL (ref 8–23)
CO2: 25 mmol/L (ref 22–32)
Calcium: 9 mg/dL (ref 8.9–10.3)
Chloride: 107 mmol/L (ref 98–111)
Creatinine, Ser: 1.09 mg/dL (ref 0.61–1.24)
GFR, Estimated: >60 mL/min
Glucose, Bld: 118 mg/dL — ABNORMAL HIGH (ref 70–99)
Potassium: 4.1 mmol/L (ref 3.5–5.1)
Sodium: 140 mmol/L (ref 135–145)

## 2024-09-24 LAB — CBC
HCT: 27.9 % — ABNORMAL LOW (ref 39.0–52.0)
Hemoglobin: 8.5 g/dL — ABNORMAL LOW (ref 13.0–17.0)
MCH: 25.3 pg — ABNORMAL LOW (ref 26.0–34.0)
MCHC: 30.5 g/dL (ref 30.0–36.0)
MCV: 83 fL (ref 80.0–100.0)
Platelets: 186 K/uL (ref 150–400)
RBC: 3.36 MIL/uL — ABNORMAL LOW (ref 4.22–5.81)
RDW: 14.6 % (ref 11.5–15.5)
WBC: 7.6 K/uL (ref 4.0–10.5)
nRBC: 0 % (ref 0.0–0.2)

## 2024-09-24 LAB — URINALYSIS, W/ REFLEX TO CULTURE (INFECTION SUSPECTED)
Bilirubin Urine: NEGATIVE
Glucose, UA: NEGATIVE mg/dL
Ketones, ur: NEGATIVE mg/dL
Nitrite: NEGATIVE
Protein, ur: 30 mg/dL — AB
RBC / HPF: >50 RBC/hpf (ref 0–5)
Specific Gravity, Urine: 1.005 (ref 1.005–1.030)
pH: 6 (ref 5.0–8.0)

## 2024-09-24 MED ORDER — CARBOXYMETHYLCELLULOSE SODIUM 0.5 % OP SOLN: 1.0000 [drp] | Freq: Three times a day (TID) | OPHTHALMIC | Status: DC | PRN

## 2024-09-24 MED ORDER — PSYLLIUM 43 % PO POWD: 4.0000 g | ORAL | Status: DC

## 2024-09-24 MED ORDER — IRON SUCROSE 300 MG IVPB - SIMPLE MED
300.0000 mg | Freq: Once | Status: AC
  Administered 2024-09-24: 300 mg via INTRAVENOUS
  Filled 2024-09-24: qty 300

## 2024-09-24 MED ORDER — POLYVINYL ALCOHOL 1.4 % OP SOLN: 1.0000 [drp] | Freq: Three times a day (TID) | OPHTHALMIC | Status: DC | PRN

## 2024-09-24 MED ORDER — BISOPROLOL FUMARATE 5 MG PO TABS: 10.0000 mg | ORAL_TABLET | Freq: Every day | ORAL | Status: DC | PRN

## 2024-09-24 MED ORDER — PSYLLIUM 95 % PO PACK
1.0000 | PACK | Freq: Every day | ORAL | Status: DC
  Filled 2024-09-24 (×5): qty 1

## 2024-09-24 MED ORDER — BISOPROLOL FUMARATE 5 MG PO TABS
5.0000 mg | ORAL_TABLET | Freq: Every day | ORAL | Status: DC
  Administered 2024-09-24 – 2024-09-26 (×3): 5 mg via ORAL
  Filled 2024-09-24 (×3): qty 1

## 2024-09-24 MED ORDER — PANTOPRAZOLE SODIUM 40 MG PO TBEC
40.0000 mg | DELAYED_RELEASE_TABLET | Freq: Two times a day (BID) | ORAL | Status: DC
  Administered 2024-09-24 – 2024-09-26 (×5): 40 mg via ORAL
  Filled 2024-09-24 (×5): qty 1

## 2024-09-24 MED ORDER — AMLODIPINE BESYLATE 10 MG PO TABS
10.0000 mg | ORAL_TABLET | Freq: Every day | ORAL | Status: DC
  Administered 2024-09-24 – 2024-09-26 (×2): 10 mg via ORAL
  Filled 2024-09-24: qty 1
  Filled 2024-09-24: qty 2
  Filled 2024-09-24: qty 1

## 2024-09-24 NOTE — Telephone Encounter (Signed)
 Auth Submission: NO AUTH NEEDED Site of care: [facility] Payer: [employer] MEDICARE Medication & CPT/J Code(s) submitted: Venofer  (Iron  Sucrose) J1756 Diagnosis Code: D50.9, K92.2, R310. Route of submission (phone, fax, portal):  Phone # Fax # Auth type: Buy/Bill HB Units/visits requested: 300MG  X 2 DOSES Reference number:  Approval from: 09/24/2024 to 11/24/24    Dagoberto Armour, CPhT [facility] Phone: [phone] 09/24/2024

## 2024-09-24 NOTE — Plan of Care (Signed)

## 2024-09-24 NOTE — ED Notes (Signed)
 Emptied foley; 3000 ml

## 2024-09-24 NOTE — Telephone Encounter (Addendum)
 Patient referred to infusion pharmacy team for ambulatory infusion of IV iron .  Insurance - Stage Manager of care - Site of care: [facility] Dx code - R31.0/D50.9 IV Iron  Therapy - Venofer  300mg  x 2 Infusion appointments - Scheduling team will schedule patient as soon as possible.   Venofer  ordered inpatient; planned admin for 09/24/2024  Sherry Pennant, PharmD, MPH, BCPS, CPP Clinical Pharmacist

## 2024-09-24 NOTE — Addendum Note (Signed)
 Addended by: DAYNE SHERRY RAMAN on: 09/24/2024 11:40 AM   Modules accepted: Orders

## 2024-09-24 NOTE — Progress Notes (Signed)
 Pt VS stable, no signs of acute distress, no c/o discomfort.

## 2024-09-24 NOTE — Consult Note (Signed)
 "  Cardiology Consultation   Patient ID: Ruben Rodriguez MRN: [id number]; DOB: 01/25/1946  Admit date: 09/23/2024 Date of Consult: 09/24/2024  PCP:  Loreli Kins, MD   [facility] Providers Cardiologist:  Redell Shallow, MD        Patient Profile: Ruben Rodriguez is a 78 y.o. male with a hx of CAD, DES to LAD in December 2024 who is being seen 09/24/2024 for the evaluation of discontinuation of antiplatelet therapy in the setting of gross hematuria at the request of Dr. Kathrin.  History of Present Illness: Ruben Rodriguez 78 year old male known to Dr. Wonda who placed drug-eluting stent to the LAD in December 2024 who unfortunately arrived today with gross hematuria hemoglobin in the upper sevens.  We were asked to comment on Plavix , safety on discontinuation at this point.  He has not had any chest pain, no anginal symptoms, EKG shows no evidence of ST segment changes or ischemia.  Was last seen in the cardiology clinic on 08/08/2024 doing well.  Prior coronary calcium  score was 429 and demonstrated severe stenosis in the mid LAD that was treated as above.  He performs self catheterizations at home.  Thankfully, urology was able to perform cystoscopy and break up 9 x 9 cm thrombus in his bladder.  Past Medical History:  Diagnosis Date   Arthritis    BPH (benign prostatic hyperplasia)    Chronic back pain    Family history of adverse reaction to anesthesia    father died during carotid artery surgery   GERD (gastroesophageal reflux disease)    Hyperlipidemia    Hypertension    Pneumonia    Spinal stenosis     Past Surgical History:  Procedure Laterality Date   BIOPSY  05/31/2023   Procedure: BIOPSY;  Surgeon: Dianna Specking, MD;  Location: [facility] ENDOSCOPY;  Service: Gastroenterology;;   COLONOSCOPY     CORONARY PRESSURE/FFR WITH 3D MAPPING N/A 08/31/2023   Procedure: Coronary Pressure/FFR w/3D Mapping;  Surgeon: Wonda Sharper, MD;  Location: [facility] INVASIVE CV LAB;   Service: Cardiovascular;  Laterality: N/A;   CORONARY STENT INTERVENTION N/A 08/31/2023   Procedure: CORONARY STENT INTERVENTION;  Surgeon: Wonda Sharper, MD;  Location: [facility] INVASIVE CV LAB;  Service: Cardiovascular;  Laterality: N/A;   ESOPHAGOGASTRODUODENOSCOPY (EGD) WITH PROPOFOL  N/A 05/31/2023   Procedure: ESOPHAGOGASTRODUODENOSCOPY (EGD) WITH PROPOFOL ;  Surgeon: Dianna Specking, MD;  Location: [facility] ENDOSCOPY;  Service: Gastroenterology;  Laterality: N/A;   FINGER SURGERY     to remove a tumor from right little finger   LAMINECTOMY WITH POSTERIOR LATERAL ARTHRODESIS LEVEL 2 N/A 08/01/2018   Procedure: LUMBAR THREE- LUMBAR FOUR, LUMBAR FOUR-LUMBAR FIVE DECOMPRESSION, LUMBAR THREE-LUMBAR FOUR, LUMBAR FOUR-LUMBAR FIVE POSTEROLATERAL ARTHRODESIS;  Surgeon: Alix Charleston, MD;  Location: [facility] OR;  Service: Neurosurgery;  Laterality: N/A;   LEFT HEART CATH AND CORONARY ANGIOGRAPHY N/A 08/27/2021   Procedure: LEFT HEART CATH AND CORONARY ANGIOGRAPHY;  Surgeon: Wonda Sharper, MD;  Location: [facility] INVASIVE CV LAB;  Service: Cardiovascular;  Laterality: N/A;   LEFT HEART CATH AND CORONARY ANGIOGRAPHY N/A 08/31/2023   Procedure: LEFT HEART CATH AND CORONARY ANGIOGRAPHY;  Surgeon: Wonda Sharper, MD;  Location: [facility] INVASIVE CV LAB;  Service: Cardiovascular;  Laterality: N/A;     Home Medications:  Prior to Admission medications  Medication Sig Start Date End Date Taking? Authorizing Provider  acetaminophen  (TYLENOL ) 650 MG CR tablet Take 650-1,300 mg by mouth daily as needed for pain.   Yes [provider]  amLODipine  (NORVASC ) 10 MG tablet  Take 10 mg by mouth daily.   Yes [provider]  bisoprolol  (ZEBETA ) 10 MG tablet Take 10 mg by mouth daily as needed (for an elevated heart rate).   Yes [provider]  clopidogrel  (PLAVIX ) 75 MG tablet TAKE 1 TABLET BY MOUTH EVERY DAY 07/01/24  Yes Crenshaw, Redell RAMAN, MD  diphenhydrAMINE  (BENADRYL ) 25 MG tablet Take 25 mg by mouth daily as  needed for itching.   Yes [provider]  ferrous sulfate  325 (65 FE) MG tablet Take 1 tablet (325 mg total) by mouth daily with breakfast. 09/03/24  Yes Regalado, Belkys A, MD  finasteride  (PROSCAR ) 5 MG tablet Take 5 mg by mouth at bedtime.   Yes [provider]  fluocinonide ointment (LIDEX) 0.05 % Apply 1 Application topically daily as needed (dryness inside ears).   Yes [provider]  METAMUCIL 4 IN 1 FIBER 43 % POWD Take 4-8 g by mouth See admin instructions. Mix 4-8 grams (2 teaspoonsful) into a glass of water and drink by mouth once a day   Yes [provider]  nitroGLYCERIN  (NITROSTAT ) 0.4 MG SL tablet Place 1 tablet (0.4 mg total) under the tongue every 5 (five) minutes as needed. 08/31/23  Yes Henry Manuelita NOVAK, NP  pantoprazole  (PROTONIX ) 40 MG tablet Take 40 mg by mouth in the morning and at bedtime.   Yes [provider]  REFRESH TEARS 0.5 % SOLN Place 1 drop into both eyes 3 (three) times daily as needed (for dryness).   Yes [provider]  tamsulosin  (FLOMAX ) 0.4 MG CAPS capsule Take 1 capsule (0.4 mg total) by mouth daily. 09/03/24  Yes Regalado, Belkys A, MD  pantoprazole  (PROTONIX ) 40 MG tablet Take 1 tablet (40 mg total) by mouth 2 (two) times daily. Patient not taking: Reported on 09/23/2024 06/02/23   Vann, Jessica U, DO  polyethylene glycol (MIRALAX  / GLYCOLAX ) 17 g packet Take 17 g by mouth daily. Patient not taking: Reported on 09/23/2024 09/03/24   Regalado, Belkys A, MD    Scheduled Meds:  amLODipine   10 mg Oral Daily   bisoprolol   5 mg Oral Daily   Chlorhexidine  Gluconate Cloth  6 each Topical Daily   finasteride   5 mg Oral QHS   pantoprazole   40 mg Oral BID   psyllium  1 packet Oral Daily   tamsulosin   0.4 mg Oral Daily   Continuous Infusions:  iron  sucrose     sodium chloride  irrigation     PRN Meds: artificial tears, morphine  injection  Allergies:   Allergies[1]  Social History:   Social History    Socioeconomic History   Marital status: Married    Spouse name: Not on file   Number of children: 2   Years of education: Not on file   Highest education level: Not on file  Occupational History   Not on file  Tobacco Use   Smoking status: Former   Smokeless tobacco: Never  Vaping Use   Vaping status: Never Used  Substance and Sexual Activity   Alcohol use: No    Comment: Occasional   Drug use: No   Sexual activity: Not on file  Other Topics Concern   Not on file  Social History Narrative   Not on file   Social Drivers of Health   Tobacco Use: Medium Risk (09/23/2024)   Patient History    Smoking Tobacco Use: Former    Smokeless Tobacco Use: Never    Passive Exposure: Not on file  Financial Resource Strain: Not on file  Food Insecurity: Unknown (08/29/2024)   Epic    Worried About Programme Researcher, Broadcasting/film/video in the Last Year: Not on file    [employer] of Food in the Last Year: Never true  Transportation Needs: No Transportation Needs (08/29/2024)   Epic    Lack of Transportation (Medical): No    Lack of Transportation (Non-Medical): No  Physical Activity: Not on file  Stress: Not on file  Social Connections: Patient Declined (08/29/2024)   Social Connection and Isolation Panel    Frequency of Communication with Friends and Family: Patient declined    Frequency of Social Gatherings with Friends and Family: Patient declined    Attends Religious Services: Patient declined    Database Administrator or Organizations: Patient declined    Attends Banker Meetings: Patient declined    Marital Status: Patient declined  Intimate Partner Violence: Not At Risk (08/29/2024)   Epic    Fear of Current or Ex-Partner: No    Emotionally Abused: No    Physically Abused: No    Sexually Abused: No  Depression (PHQ2-9): Not on file  Alcohol Screen: Not on file  Housing: Unknown (08/29/2024)   Epic    Unable to Pay for Housing in the Last Year: Patient declined    Number of  Times Moved in the Last Year: 0    Homeless in the Last Year: Patient declined  Utilities: Not At Risk (08/29/2024)   Epic    Threatened with loss of utilities: No  Health Literacy: Not on file    Family History:    Family History  Problem Relation Age of Onset   Hypertension Mother      ROS:  Please see the history of present illness.   All other ROS reviewed and negative.     Physical Exam/Data: Vitals:   09/24/24 0615 09/24/24 0630 09/24/24 0700 09/24/24 1117  BP: (!) 150/92  137/86 100/64  Pulse: 83 77 84 61  Resp: 19  16 18   Temp:    98.3 F (36.8 C)  TempSrc:    Oral  SpO2: 98% 96% 97% 98%  Weight:        Intake/Output Summary (Last 24 hours) at 09/24/2024 1142 Last data filed at 09/24/2024 0724 Gross per 24 hour  Intake --  Output 9300 ml  Net -9300 ml      09/23/2024    8:28 AM 09/01/2024   10:01 PM 08/29/2024   11:34 AM  Last 3 Weights  Weight (lbs) 206 lb 211 lb 210 lb  Weight (kg) 93.441 kg 95.709 kg 95.255 kg     Body mass index is 30.42 kg/m.  General:  Well nourished, well developed, in no acute distress HEENT: normal Neck: no JVD Vascular: No carotid bruits; Distal pulses 2+ bilaterally Cardiac:  normal S1, S2; RRR; no murmur  Lungs:  clear to auscultation bilaterally, no wheezing, rhonchi or rales  Abd: soft, nontender, no hepatomegaly  Ext: no edema Musculoskeletal:  No deformities, BUE and BLE strength normal and equal Skin: warm and dry  Neuro:  CNs 2-12 intact, no focal abnormalities noted Psych:  Normal affect   EKG:  The EKG was personally reviewed and demonstrates: 08/29/2024-sinus bradycardia rate 49 with no ST segment changes  Telemetry:  Telemetry was personally reviewed and demonstrates: No adverse arrhythmias  Relevant CV Studies: Cardiac catheterization Sep 17, 2023  Diagnostic Dominance: Right  Intervention      Laboratory Data: High Sensitivity Troponin:  No results for input(s): TROPONINIHS in the last 720  hours.  Recent Labs  Lab 08/29/24 1255 08/29/24 1501  TRNPT <15 <15      Chemistry Recent Labs  Lab 09/23/24 0849 09/23/24 1959 09/24/24 0410  NA 139  --  140  K 3.3*  --  4.1  CL 104  --  107  CO2 23  --  25  GLUCOSE 180*  --  118*  BUN 14  --  22  CREATININE 1.19  --  1.09  CALCIUM  9.2  --  9.0  MG  --  2.1  --   GFRNONAA >60  --  >60  ANIONGAP 13  --  8    Recent Labs  Lab 09/23/24 0849  PROT 6.3*  ALBUMIN 3.8  AST 13*  ALT 12  ALKPHOS 43  BILITOT 0.7   Lipids No results for input(s): CHOL, TRIG, HDL, LABVLDL, LDLCALC, CHOLHDL in the last 168 hours.  Hematology Recent Labs  Lab 09/23/24 0849 09/23/24 1959 09/24/24 0410  WBC 10.6* 8.5 7.6  RBC 3.75* 3.06*  3.09* 3.36*  HGB 9.5* 7.9* 8.5*  HCT 30.1* 25.5* 27.9*  MCV 80.3 83.3 83.0  MCH 25.3* 25.8* 25.3*  MCHC 31.6 31.0 30.5  RDW 14.4 14.6 14.6  PLT 211 186 186   Thyroid  No results for input(s): TSH, FREET4 in the last 168 hours.  BNPNo results for input(s): BNP, PROBNP in the last 168 hours.  DDimer No results for input(s): DDIMER in the last 168 hours.  Radiology/Studies:  CT PELVIS W CONTRAST Result Date: 09/23/2024 EXAM: CT PELVIS, WITH IV CONTRAST 09/23/2024 12:00:50 PM TECHNIQUE: Axial images were acquired through the pelvis with IV contrast. Reformatted images were reviewed. Automated exposure control, iterative reconstruction, and/or weight based adjustment of the mA/kV was utilized to reduce the radiation dose to as low as reasonably achievable. COMPARISON: Ultrasound pelvis 09/23/24 CLINICAL HISTORY: eval bladder mass eval bladder mass FINDINGS: BONES: Surgical hardware of the lumbar spine partially visualized. No acute fracture or focal osseous lesion. JOINTS: No dislocation. The joint spaces are normal. SOFT TISSUES: Tiny fat-containing umbilical hernia. INTRAPELVIC CONTENTS: Redemonstration of distended urinary bladder lumen with urine as well as heterogeneous hyperdense  mass-like lesion measuring 9.5 x 9.5 x 9.5 cm. Associated foci of gas within this lesion that may represent a mass versus blood clot. No urinary bladder wall thickening. No gas within the urinary bladder wall. Prominent prostate measuring up to 4.7 cm. No hydroureter of the visualized distal ureters. Colonic diverticulosis. No bowel thickening or dilatation of the visualized portions of the small and large bowel. No free fluid or gas within the peritoneum. No organized fluid collection. IMPRESSION: 1. Distended urinary bladder with urine as well as a 9.5 x 9.5 x 9.5 cm heterogeneous hyperdense intraluminal mass-like lesion containing gas, which may represent tumor versus blood clot. 2. No urinary bladder wall thickening or intramural gas. No hydroureter of the visualized distal ureters. Electronically signed by: Morgane Naveau MD 09/23/2024 12:14 PM EST RP Workstation: [id number]   US  Pelvis Limited Result Date: 09/23/2024 EXAM: PELVIC ULTRASOUND TECHNIQUE: Transabdominal pelvic duplex ultrasound using B-mode/gray scaled imaging with Doppler spectral analysis and color flow was obtained. COMPARISON: CTA GI bleed on 08/29/2024, CT abdomen and pelvis on 10/30/2022. CLINICAL HISTORY: Eval for urinary retention, clot burden. FINDINGS: ULTRASOUND FINDINGS: URINARY BLADDER: Prevoid volume 1099 ml. Postvoid volume - unable to void. Mass-like heterogeneous echogenic lesion within the urinary bladder lumen measuring 10 x 7 x 8 cm, consistent with  a volume of 320 ml. PROSTATE: The prostate measures 6 x 4.2 x 6.3 cm with a volume of 83 ml, which is enlarged. FREE FLUID: No free fluid. IMPRESSION: 1. Marked urinary bladder distention with inability to void on postvoid assessment. 2. Mass-like heterogeneous echogenic lesion within the urinary bladder lumen measuring 10 x 7 x 8 cm (approximately 320 mL), compatible with reported clot burden. Electronically signed by: Morgane Naveau MD 09/23/2024 12:08 PM EST RP Workstation:  [id number]     Assessment and Plan:  Coronary artery disease status post drug-eluting stent to the mid LAD on 08/31/2023 currently on Plavix  who comes in with gross hematuria.  CAD post DES 08/31/2023 - I am comfortable at this point discontinuing the Plavix .  Risk of stent thrombosis is very small.  Could consider reinitiation of Plavix  75 mg monotherapy following this hematuria episode if felt to be comfortable from urologic perspective.  Aspirin  previously caused gastric upset.  At this time given the CT of the pelvis showing a 9 x 9 cm intraluminal mass felt to be thrombus, lets hold off on any antiplatelet at this point. -Wife present for discussion.  Anemia - Hemoglobin 7.9 nadir, currently 8.5.  Urine dark.  Hyperlipidemia - Previously did not tolerate statins.  Did not tolerate Zetia, did not tolerate PCSK9 inhibitor.  Continue with diet.  Hypertension - Blood pressure well-controlled.  We will go ahead and sign off.  Please let us  know if we can be of further assistance.    For questions or updates, please contact [facility] Please consult [email or web address] for contact info under      Signed, Oneil Parchment, MD  09/24/2024 11:42 AM     [1]  Allergies Allergen Reactions   Evolocumab  Shortness Of Breath, Rash and Other (See Comments)    Repatha - Pain, body aches, vision problems- also   Fish Allergy Anaphylaxis, Rash and Other (See Comments)    Tolerates salmon and tuna   Penicillins Anaphylaxis, Dermatitis and Other (See Comments)    Hospitalized, per patient- happened during childhood (severe reaction)   Atorvastatin Other (See Comments)    Muscle aches   Ezetimibe Other (See Comments)    Muscle aches   Zocor [Simvastatin] Other (See Comments)    Muscle aches   Amoxicillin Other (See Comments)    MD advised patient to not take this, either   Crestor  [Rosuvastatin  Calcium ] Other (See Comments)    Joint and muscle pain   "

## 2024-09-24 NOTE — Progress Notes (Signed)
 " PROGRESS NOTE  Ruben Rodriguez FMW:[id number] DOB: 01-Oct-1945   PCP: Loreli Kins, MD  Patient is from: Home.  Lives with wife.  Very active at baseline.  DOA: 09/23/2024 LOS: 0  Chief complaints Chief Complaint  Patient presents with   Hematuria     Brief Narrative / Interim history: 78 y.o. male with PMH of CAD s/p DES to mid LAD on 08/31/2023 currently on Plavix , HTN, GI bleed, BPH and urinary tension presented to ED with abdominal discomfort, hematuria and dysuria, admitted with gross hematuria.  Recently hospitalized from 12/4-12/8 for GI bleed.  Did not require procedures since hemoglobin is stable.  He was discharged with Foley catheter due to acute on chronic urinary tension.  Foley catheter was removed at urology office on 12/15, and he has been performing self-catheterization since then.  In ED, initially hypotensive to 83/55 and tachycardic to 129.  Vitals normalized quickly without IV fluid.  Hgb 9.5 (baseline).  WBC 10.6.  K3.3.  Glucose 180.   INR 1.3.  CT pelvis showed 9.5 x 9.5 x 9.5 cm heterogeneous hyperdense intraluminal mass felt to be blood clots.   Patient had cystoscopy ureteroscopy and hand irrigation with normal saline by urology and started on CBI.  Admission requested for gross hematuria.     Subjective: Seen and examined earlier this morning.  No major events overnight or this morning.  No major events overnight of this morning.  No complaints.  Hemoglobin dipped to 7.9 last night but up to 8.5 this morning.  Anemia panel with iron  deficiency.  CBI still dark red.  Patient's wife at bedside.   Assessment and plan: Gross hematuria-patient noted gross hematuria with self catheterization earlier this morning.  He is on Plavix  for CAD.  Last dose the morning of 12/28.  Hgb 9.5 which is about baseline.  Hemodynamically stable. -S/p cystourethroscopy and hand irrigation with normal saline by urology -CBI per urology. -Continue holding Plavix .  Requested  cardiology input. -Recheck CBC in the morning -Follow urine culture.   Acute on chronic blood loss anemia: Hgb relatively stable.  Anemia panel with iron  deficiency. - IV Venofer  300 mg x 1 on 12/30.  Additional IV iron  outpatient. -Continue holding Plavix .  Requested cardiology input. -Recheck CBC in the morning   BPH/chronic urinary tension-does self-catheterization at home.   - Continue CBI - Continue home Flomax  and Proscar    CAD s/p DES to LAD on 08/31/2023: On Plavix .  Last dose the morning of 12/28. -Continue home bisoprolol  -Continue holding Plavix .  Requested cardiology input.   Essential hypertension: BP slightly elevated this morning. Hypotension: Brief hypotension on arrival to ED that has resolved without IV fluid. -Restart home amlodipine  -Restart home bisoprolol  at 5 mg daily   Hypokalemia -Monitor replenish K and Mg as appropriate   Hyperglycemia: No history of diabetes. - Check hemoglobin A1c  Class I obesity Body mass index is 30.42 kg/m.           DVT prophylaxis:  SCDs Start: 09/23/24 1716  Code Status: Full code Family Communication: Updated patient's wife at bedside Level of care: Med-Surg Status is: Observation The patient will require care spanning > 2 midnights and should be moved to inpatient because: Due to gross hematuria, blood loss anemia   Final disposition: Home   55 minutes with more than 50% spent in reviewing records, counseling patient/family and coordinating care.  Consultants:  Urology  Procedures: Continuous bladder irrigation  Microbiology summarized: Urine culture pending  Objective: Vitals:  09/24/24 0604 09/24/24 0615 09/24/24 0630 09/24/24 0700  BP:  (!) 150/92  137/86  Pulse:  83 77 84  Resp:  19  16  Temp: (!) 97.5 F (36.4 C)     TempSrc: Oral     SpO2:  98% 96% 97%  Weight:        Examination:  GENERAL: No apparent distress.  Nontoxic. HEENT: MMM.  Vision and hearing grossly intact.  NECK:  Supple.  No apparent JVD.  RESP:  No IWOB.  Fair aeration bilaterally. CVS:  RRR. Heart sounds normal.  ABD/GI/GU: BS+. Abd soft, NTND.  Foley catheter in place.  CBI dark red. MSK/EXT:  Moves extremities. No apparent deformity. No edema.  SKIN: no apparent skin lesion or wound NEURO: AA.  Oriented appropriately.  No apparent focal neuro deficit. PSYCH: Calm. Normal affect.   Sch Meds:  Scheduled Meds:  amLODipine   10 mg Oral Daily   bisoprolol   5 mg Oral Daily   Chlorhexidine  Gluconate Cloth  6 each Topical Daily   finasteride   5 mg Oral QHS   pantoprazole   40 mg Oral BID   psyllium  1 packet Oral Daily   tamsulosin   0.4 mg Oral Daily   Continuous Infusions:  iron  sucrose     sodium chloride  irrigation     PRN Meds:.artificial tears, morphine  injection  Antimicrobials: Anti-infectives (From admission, onward)    None        I have personally reviewed the following labs and images: CBC: Recent Labs  Lab 09/23/24 0849 09/23/24 1959 09/24/24 0410  WBC 10.6* 8.5 7.6  NEUTROABS 9.2*  --   --   HGB 9.5* 7.9* 8.5*  HCT 30.1* 25.5* 27.9*  MCV 80.3 83.3 83.0  PLT 211 186 186   BMP &GFR Recent Labs  Lab 09/23/24 0849 09/23/24 1959 09/24/24 0410  NA 139  --  140  K 3.3*  --  4.1  CL 104  --  107  CO2 23  --  25  GLUCOSE 180*  --  118*  BUN 14  --  22  CREATININE 1.19  --  1.09  CALCIUM  9.2  --  9.0  MG  --  2.1  --    Estimated Creatinine Clearance: 63 mL/min (by C-G formula based on SCr of 1.09 mg/dL). Liver & Pancreas: Recent Labs  Lab 09/23/24 0849  AST 13*  ALT 12  ALKPHOS 43  BILITOT 0.7  PROT 6.3*  ALBUMIN 3.8   No results for input(s): LIPASE, AMYLASE in the last 168 hours. No results for input(s): AMMONIA in the last 168 hours. Diabetic: No results for input(s): HGBA1C in the last 72 hours. No results for input(s): GLUCAP in the last 168 hours. Cardiac Enzymes: No results for input(s): CKTOTAL, CKMB, CKMBINDEX,  TROPONINI in the last 168 hours. No results for input(s): PROBNP in the last 8760 hours. Coagulation Profile: Recent Labs  Lab 09/23/24 0849  INR 1.0   Thyroid  Function Tests: No results for input(s): TSH, T4TOTAL, FREET4, T3FREE, THYROIDAB in the last 72 hours. Lipid Profile: No results for input(s): CHOL, HDL, LDLCALC, TRIG, CHOLHDL, LDLDIRECT in the last 72 hours. Anemia Panel: Recent Labs    09/23/24 1959  VITAMINB12 254  FOLATE 14.1  FERRITIN 66  TIBC 329  IRON  14*  RETICCTPCT 2.6   Urine analysis:    Component Value Date/Time   COLORURINE RED (A) 09/24/2024 0758   APPEARANCEUR CLOUDY (A) 09/24/2024 0758   LABSPEC 1.005 09/24/2024 0758  PHURINE 6.0 09/24/2024 0758   GLUCOSEU NEGATIVE 09/24/2024 0758   HGBUR MODERATE (A) 09/24/2024 0758   BILIRUBINUR NEGATIVE 09/24/2024 0758   KETONESUR NEGATIVE 09/24/2024 0758   PROTEINUR 30 (A) 09/24/2024 0758   NITRITE NEGATIVE 09/24/2024 0758   LEUKOCYTESUR MODERATE (A) 09/24/2024 0758   Sepsis Labs: Invalid input(s): PROCALCITONIN, LACTICIDVEN  Microbiology: No results found for this or any previous visit (from the past 240 hours).  Radiology Studies: CT PELVIS W CONTRAST Result Date: 09/23/2024 EXAM: CT PELVIS, WITH IV CONTRAST 09/23/2024 12:00:50 PM TECHNIQUE: Axial images were acquired through the pelvis with IV contrast. Reformatted images were reviewed. Automated exposure control, iterative reconstruction, and/or weight based adjustment of the mA/kV was utilized to reduce the radiation dose to as low as reasonably achievable. COMPARISON: Ultrasound pelvis 09/23/24 CLINICAL HISTORY: eval bladder mass eval bladder mass FINDINGS: BONES: Surgical hardware of the lumbar spine partially visualized. No acute fracture or focal osseous lesion. JOINTS: No dislocation. The joint spaces are normal. SOFT TISSUES: Tiny fat-containing umbilical hernia. INTRAPELVIC CONTENTS: Redemonstration of distended  urinary bladder lumen with urine as well as heterogeneous hyperdense mass-like lesion measuring 9.5 x 9.5 x 9.5 cm. Associated foci of gas within this lesion that may represent a mass versus blood clot. No urinary bladder wall thickening. No gas within the urinary bladder wall. Prominent prostate measuring up to 4.7 cm. No hydroureter of the visualized distal ureters. Colonic diverticulosis. No bowel thickening or dilatation of the visualized portions of the small and large bowel. No free fluid or gas within the peritoneum. No organized fluid collection. IMPRESSION: 1. Distended urinary bladder with urine as well as a 9.5 x 9.5 x 9.5 cm heterogeneous hyperdense intraluminal mass-like lesion containing gas, which may represent tumor versus blood clot. 2. No urinary bladder wall thickening or intramural gas. No hydroureter of the visualized distal ureters. Electronically signed by: Morgane Naveau MD 09/23/2024 12:14 PM EST RP Workstation: [id number]      Ujbz T. Kimiyo Carmicheal [facility]  If 7PM-7AM, please contact night-coverage [email or web address] 09/24/2024, 11:09 AM   "

## 2024-09-24 NOTE — Progress Notes (Signed)
 "    Subjective: No acute events overnight.  Patient unfortunately still boarding in the emergency department.  Patient very complementary of his excellent care by ED nursing last night.  Objective: Vital signs in last 24 hours: Temp:  [97.5 F (36.4 C)-98.7 F (37.1 C)] 97.5 F (36.4 C) (12/30 0604) Pulse Rate:  [61-101] 84 (12/30 0700) Resp:  [16-19] 16 (12/30 0700) BP: (106-150)/(57-92) 137/86 (12/30 0700) SpO2:  [91 %-100 %] 97 % (12/30 0700)  Assessment/Plan: # Gross hematuria # Clot obstruction of urine # BPH   Large 4.7cm prostate on CT imaging 12/29   Notable swelling of prostatic tissue but no false passage or injury appreciated on cystoscopy.  Please see separate procedure note.  66f three-way hematuria coud placed.  CBI on low-medium gtt.   Hold Plavix  if medically reasonable.  Last dose Sunday.  Patient understands that it will take several days to washout.  Continue to trend hemoglobin.  Encouraged by interval improvement overnight.  Will make patient n.p.o. at midnight in the event he has a precipitous drop and cystoscopy with fulguration is indicated.   Nursing to hand irrigate as needed   Will continue to monitor  Intake/Output from previous day: 12/29 0701 - 12/30 0700 In: -  Out: 8100 [Urine:8100]  Intake/Output this shift: Total I/O In: -  Out: 1200 [Urine:1200]  Physical Exam:  General: Alert and oriented CV: No cyanosis Lungs: equal chest rise Gu: 53F three-way hematuria coud in place with CBI on low/medium gtt.  Light pink irrigant  Lab Results: Recent Labs    09/23/24 0849 09/23/24 1959 09/24/24 0410  HGB 9.5* 7.9* 8.5*  HCT 30.1* 25.5* 27.9*   BMET Recent Labs    09/23/24 0849 09/23/24 1959 09/24/24 0410  NA 139  --  140  K 3.3*  --  4.1  CL 104  --  107  CO2 23  --  25  GLUCOSE 180*  --  118*  BUN 14  --  22  CREATININE 1.19  --  1.09  CALCIUM  9.2  --  9.0  HGB 9.5* 7.9* 8.5*  WBC 10.6* 8.5 7.6      Studies/Results: CT PELVIS W CONTRAST Result Date: 09/23/2024 EXAM: CT PELVIS, WITH IV CONTRAST 09/23/2024 12:00:50 PM TECHNIQUE: Axial images were acquired through the pelvis with IV contrast. Reformatted images were reviewed. Automated exposure control, iterative reconstruction, and/or weight based adjustment of the mA/kV was utilized to reduce the radiation dose to as low as reasonably achievable. COMPARISON: Ultrasound pelvis 09/23/24 CLINICAL HISTORY: eval bladder mass eval bladder mass FINDINGS: BONES: Surgical hardware of the lumbar spine partially visualized. No acute fracture or focal osseous lesion. JOINTS: No dislocation. The joint spaces are normal. SOFT TISSUES: Tiny fat-containing umbilical hernia. INTRAPELVIC CONTENTS: Redemonstration of distended urinary bladder lumen with urine as well as heterogeneous hyperdense mass-like lesion measuring 9.5 x 9.5 x 9.5 cm. Associated foci of gas within this lesion that may represent a mass versus blood clot. No urinary bladder wall thickening. No gas within the urinary bladder wall. Prominent prostate measuring up to 4.7 cm. No hydroureter of the visualized distal ureters. Colonic diverticulosis. No bowel thickening or dilatation of the visualized portions of the small and large bowel. No free fluid or gas within the peritoneum. No organized fluid collection. IMPRESSION: 1. Distended urinary bladder with urine as well as a 9.5 x 9.5 x 9.5 cm heterogeneous hyperdense intraluminal mass-like lesion containing gas, which may represent tumor versus blood clot. 2. No urinary bladder  wall thickening or intramural gas. No hydroureter of the visualized distal ureters. Electronically signed by: Morgane Naveau MD 09/23/2024 12:14 PM EST RP Workstation: [id number]   US  Pelvis Limited Result Date: 09/23/2024 EXAM: PELVIC ULTRASOUND TECHNIQUE: Transabdominal pelvic duplex ultrasound using B-mode/gray scaled imaging with Doppler spectral analysis and color flow  was obtained. COMPARISON: CTA GI bleed on 08/29/2024, CT abdomen and pelvis on 10/30/2022. CLINICAL HISTORY: Eval for urinary retention, clot burden. FINDINGS: ULTRASOUND FINDINGS: URINARY BLADDER: Prevoid volume 1099 ml. Postvoid volume - unable to void. Mass-like heterogeneous echogenic lesion within the urinary bladder lumen measuring 10 x 7 x 8 cm, consistent with a volume of 320 ml. PROSTATE: The prostate measures 6 x 4.2 x 6.3 cm with a volume of 83 ml, which is enlarged. FREE FLUID: No free fluid. IMPRESSION: 1. Marked urinary bladder distention with inability to void on postvoid assessment. 2. Mass-like heterogeneous echogenic lesion within the urinary bladder lumen measuring 10 x 7 x 8 cm (approximately 320 mL), compatible with reported clot burden. Electronically signed by: Morgane Naveau MD 09/23/2024 12:08 PM EST RP Workstation: [id number]      LOS: 0 days   Ole Bourdon, NP [facility] Pager: [phone]  09/24/2024, 10:26 AM   "

## 2024-09-25 ENCOUNTER — Inpatient Hospital Stay (HOSPITAL_COMMUNITY): Admitting: Certified Registered Nurse Anesthetist

## 2024-09-25 ENCOUNTER — Encounter (HOSPITAL_COMMUNITY): Payer: Self-pay | Admitting: Student

## 2024-09-25 ENCOUNTER — Encounter (HOSPITAL_COMMUNITY)
Admission: EM | Disposition: A | Discharge status: Home / Self Care | Payer: Self-pay | Source: Home / Self Care | Attending: Student

## 2024-09-25 ENCOUNTER — Inpatient Hospital Stay (HOSPITAL_COMMUNITY)

## 2024-09-25 DIAGNOSIS — R310 Gross hematuria: Secondary | ICD-10-CM | POA: Diagnosis not present

## 2024-09-25 DIAGNOSIS — R338 Other retention of urine: Secondary | ICD-10-CM | POA: Diagnosis not present

## 2024-09-25 DIAGNOSIS — I10 Essential (primary) hypertension: Secondary | ICD-10-CM | POA: Diagnosis not present

## 2024-09-25 DIAGNOSIS — Z87891 Personal history of nicotine dependence: Secondary | ICD-10-CM | POA: Diagnosis not present

## 2024-09-25 DIAGNOSIS — N3289 Other specified disorders of bladder: Secondary | ICD-10-CM | POA: Diagnosis not present

## 2024-09-25 HISTORY — PX: CYSTOSCOPY WITH FULGERATION: SHX6638

## 2024-09-25 LAB — CBC
HCT: 27.7 % — ABNORMAL LOW (ref 39.0–52.0)
Hemoglobin: 8.8 g/dL — ABNORMAL LOW (ref 13.0–17.0)
MCH: 25.5 pg — ABNORMAL LOW (ref 26.0–34.0)
MCHC: 31.8 g/dL (ref 30.0–36.0)
MCV: 80.3 fL (ref 80.0–100.0)
Platelets: 208 K/uL (ref 150–400)
RBC: 3.45 MIL/uL — ABNORMAL LOW (ref 4.22–5.81)
RDW: 14.4 % (ref 11.5–15.5)
WBC: 5.1 K/uL (ref 4.0–10.5)
nRBC: 0 % (ref 0.0–0.2)

## 2024-09-25 LAB — RENAL FUNCTION PANEL
Albumin: 3.4 g/dL — ABNORMAL LOW (ref 3.5–5.0)
Anion gap: 8 (ref 5–15)
BUN: 15 mg/dL (ref 8–23)
CO2: 26 mmol/L (ref 22–32)
Calcium: 9.1 mg/dL (ref 8.9–10.3)
Chloride: 108 mmol/L (ref 98–111)
Creatinine, Ser: 0.93 mg/dL (ref 0.61–1.24)
GFR, Estimated: >60 mL/min
Glucose, Bld: 107 mg/dL — ABNORMAL HIGH (ref 70–99)
Phosphorus: 3.1 mg/dL (ref 2.5–4.6)
Potassium: 3.8 mmol/L (ref 3.5–5.1)
Sodium: 141 mmol/L (ref 135–145)

## 2024-09-25 LAB — HEMOGLOBIN A1C
Hgb A1c MFr Bld: 5.3 % (ref 4.8–5.6)
Mean Plasma Glucose: 105.41 mg/dL

## 2024-09-25 LAB — MAGNESIUM: Magnesium: 2.1 mg/dL (ref 1.7–2.4)

## 2024-09-25 SURGERY — CYSTOSCOPY, WITH BLADDER FULGURATION
Anesthesia: General

## 2024-09-25 MED ORDER — SODIUM CHLORIDE 0.9 % IR SOLN
Status: DC | PRN
  Administered 2024-09-25: 1000 mL via INTRAVESICAL

## 2024-09-25 MED ORDER — PROPOFOL 10 MG/ML IV BOLUS
INTRAVENOUS | Status: AC
  Filled 2024-09-25: qty 20

## 2024-09-25 MED ORDER — ONDANSETRON HCL 4 MG/2ML IJ SOLN: 4.0000 mg | Freq: Once | INTRAMUSCULAR | Status: DC | PRN

## 2024-09-25 MED ORDER — GLYCOPYRROLATE 0.2 MG/ML IJ SOLN
INTRAMUSCULAR | Status: DC | PRN
  Administered 2024-09-25: .2 mg via INTRAVENOUS

## 2024-09-25 MED ORDER — SODIUM CHLORIDE 0.9 % IR SOLN
3000.0000 mL | Status: DC
  Administered 2024-09-25: 3000 mL

## 2024-09-25 MED ORDER — DEXAMETHASONE SOD PHOSPHATE PF 10 MG/ML IJ SOLN
INTRAMUSCULAR | Status: DC | PRN
  Administered 2024-09-25: 5 mg via INTRAVENOUS

## 2024-09-25 MED ORDER — FENTANYL CITRATE (PF) 100 MCG/2ML IJ SOLN
INTRAMUSCULAR | Status: AC
  Filled 2024-09-25: qty 2

## 2024-09-25 MED ORDER — LIDOCAINE HCL (PF) 2 % IJ SOLN
INTRAMUSCULAR | Status: AC
  Filled 2024-09-25: qty 5

## 2024-09-25 MED ORDER — EPHEDRINE 5 MG/ML INJ
INTRAVENOUS | Status: AC
  Filled 2024-09-25: qty 5

## 2024-09-25 MED ORDER — OXYCODONE HCL 5 MG/5ML PO SOLN: 5.0000 mg | Freq: Once | ORAL | Status: DC | PRN

## 2024-09-25 MED ORDER — LIDOCAINE HCL (PF) 2 % IJ SOLN
INTRAMUSCULAR | Status: DC | PRN
  Administered 2024-09-25: 80 mg via INTRADERMAL

## 2024-09-25 MED ORDER — PHENYLEPHRINE 80 MCG/ML (10ML) SYRINGE FOR IV PUSH (FOR BLOOD PRESSURE SUPPORT)
PREFILLED_SYRINGE | INTRAVENOUS | Status: AC
  Filled 2024-09-25: qty 10

## 2024-09-25 MED ORDER — ORAL CARE MOUTH RINSE: 15.0000 mL | Freq: Once | OROMUCOSAL | Status: AC

## 2024-09-25 MED ORDER — PROPOFOL 10 MG/ML IV BOLUS
INTRAVENOUS | Status: DC | PRN
  Administered 2024-09-25: 140 mg via INTRAVENOUS
  Administered 2024-09-25: 20 mg via INTRAVENOUS

## 2024-09-25 MED ORDER — FENTANYL CITRATE (PF) 50 MCG/ML IJ SOSY
PREFILLED_SYRINGE | INTRAMUSCULAR | Status: AC
  Filled 2024-09-25: qty 1

## 2024-09-25 MED ORDER — FENTANYL CITRATE (PF) 50 MCG/ML IJ SOSY
25.0000 ug | PREFILLED_SYRINGE | INTRAMUSCULAR | Status: DC | PRN
  Administered 2024-09-25: 50 ug via INTRAVENOUS

## 2024-09-25 MED ORDER — CIPROFLOXACIN IN D5W 400 MG/200ML IV SOLN
400.0000 mg | Freq: Once | INTRAVENOUS | Status: AC
  Administered 2024-09-25: 400 mg via INTRAVENOUS
  Filled 2024-09-25 (×2): qty 200

## 2024-09-25 MED ORDER — GLYCOPYRROLATE 0.2 MG/ML IJ SOLN
INTRAMUSCULAR | Status: AC
  Filled 2024-09-25: qty 1

## 2024-09-25 MED ORDER — ACETAMINOPHEN 10 MG/ML IV SOLN
1000.0000 mg | Freq: Once | INTRAVENOUS | Status: DC | PRN
  Administered 2024-09-25: 1000 mg via INTRAVENOUS

## 2024-09-25 MED ORDER — FENTANYL CITRATE (PF) 100 MCG/2ML IJ SOLN
INTRAMUSCULAR | Status: DC | PRN
  Administered 2024-09-25 (×2): 50 ug via INTRAVENOUS

## 2024-09-25 MED ORDER — ONDANSETRON HCL 4 MG/2ML IJ SOLN
INTRAMUSCULAR | Status: DC | PRN
  Administered 2024-09-25: 4 mg via INTRAVENOUS

## 2024-09-25 MED ORDER — OXYCODONE HCL 5 MG PO TABS: 5.0000 mg | ORAL_TABLET | Freq: Once | ORAL | Status: DC | PRN

## 2024-09-25 MED ORDER — EPHEDRINE SULFATE (PRESSORS) 25 MG/5ML IV SOSY
PREFILLED_SYRINGE | INTRAVENOUS | Status: DC | PRN
  Administered 2024-09-25: 15 mg via INTRAVENOUS
  Administered 2024-09-25: 10 mg via INTRAVENOUS

## 2024-09-25 MED ORDER — LACTATED RINGERS IV SOLN: INTRAVENOUS | Status: DC

## 2024-09-25 MED ORDER — CHLORHEXIDINE GLUCONATE 0.12 % MT SOLN
15.0000 mL | Freq: Once | OROMUCOSAL | Status: AC
  Administered 2024-09-25: 15 mL via OROMUCOSAL

## 2024-09-25 MED ORDER — ONDANSETRON HCL 4 MG/2ML IJ SOLN
INTRAMUSCULAR | Status: AC
  Filled 2024-09-25: qty 2

## 2024-09-25 MED ORDER — ACETAMINOPHEN 10 MG/ML IV SOLN
INTRAVENOUS | Status: AC
  Filled 2024-09-25: qty 100

## 2024-09-25 MED ORDER — PHENYLEPHRINE 80 MCG/ML (10ML) SYRINGE FOR IV PUSH (FOR BLOOD PRESSURE SUPPORT)
PREFILLED_SYRINGE | INTRAVENOUS | Status: DC | PRN
  Administered 2024-09-25: 160 ug via INTRAVENOUS
  Administered 2024-09-25: 80 ug via INTRAVENOUS

## 2024-09-25 SURGICAL SUPPLY — 15 items
BAG URINE DRAIN 2000ML AR STRL (UROLOGICAL SUPPLIES) ×1 IMPLANT
BAG URO CATCHER STRL LF (MISCELLANEOUS) ×1 IMPLANT
CATH FOLEY 3WAY 30CC 22FR (CATHETERS) IMPLANT
DRAPE FOOT SWITCH (DRAPES) ×1 IMPLANT
GLOVE SURG LX STRL 8.0 MICRO (GLOVE) ×1 IMPLANT
GOWN STRL SURGICAL XL XLNG (GOWN DISPOSABLE) ×1 IMPLANT
HOLDER FOLEY CATH W/STRAP (MISCELLANEOUS) IMPLANT
KIT TURNOVER KIT A (KITS) ×1 IMPLANT
LOOP CUT BIPOLAR 24F LRG (ELECTROSURGICAL) ×1 IMPLANT
MANIFOLD NEPTUNE II (INSTRUMENTS) ×1 IMPLANT
PACK CYSTO (CUSTOM PROCEDURE TRAY) ×1 IMPLANT
PAD PREP 24X48 CUFFED NSTRL (MISCELLANEOUS) ×1 IMPLANT
SYRINGE TOOMEY IRRIG 70ML (MISCELLANEOUS) ×1 IMPLANT
TUBING CONNECTING 10 (TUBING) ×1 IMPLANT
TUBING UROLOGY SET (TUBING) ×1 IMPLANT

## 2024-09-25 NOTE — Progress Notes (Signed)
 "   Subjective: He had clot obstruction requiring irrigation yesterday and the urine is variably bloody on CBI now but his Hgb is up a bit suggesting no active bleeding and probable retained clot.   ROS:  Review of Systems  All other systems reviewed and are negative.   Anti-infectives: Anti-infectives (From admission, onward)    None       Current Facility-Administered Medications  Medication Dose Route Frequency Provider Last Rate Last Admin   amLODipine  (NORVASC ) tablet 10 mg  10 mg Oral Daily Gonfa, Taye T, MD   10 mg at 09/24/24 1055   artificial tears ophthalmic solution 1 drop  1 drop Both Eyes TID PRN Gonfa, Taye T, MD       bisoprolol  (ZEBETA ) tablet 5 mg  5 mg Oral Daily Gonfa, Taye T, MD   5 mg at 09/24/24 9056   Chlorhexidine  Gluconate Cloth 2 % PADS 6 each  6 each Topical Daily Delia Ole ORN, NP   6 each at 09/23/24 1649   finasteride  (PROSCAR ) tablet 5 mg  5 mg Oral QHS Gonfa, Taye T, MD   5 mg at 09/24/24 2226   morphine  (PF) 2 MG/ML injection 2 mg  2 mg Intravenous Q2H PRN Rogelia Jerilynn RAMAN, MD   2 mg at 09/23/24 1811   pantoprazole  (PROTONIX ) EC tablet 40 mg  40 mg Oral BID Gonfa, Taye T, MD   40 mg at 09/24/24 2226   psyllium (HYDROCIL/METAMUCIL) 1 packet  1 packet Oral Daily Kathrin Mignon DASEN, MD       sodium chloride  irrigation 0.9 % 3,000 mL  3,000 mL Irrigation Continuous Sattenfield, Cameron W, NP   3,000 mL at 09/24/24 9385   tamsulosin  (FLOMAX ) capsule 0.4 mg  0.4 mg Oral Daily Kathrin Mignon T, MD   0.4 mg at 09/24/24 1055     Objective: Vital signs in last 24 hours: Temp:  [98.1 F (36.7 C)-99 F (37.2 C)] 98.3 F (36.8 C) (12/31 0318) Pulse Rate:  [61-74] 72 (12/31 0318) Resp:  [16-18] 16 (12/31 0318) BP: (100-142)/(64-84) 130/79 (12/31 0318) SpO2:  [95 %-100 %] 95 % (12/31 0318) Weight:  [91.6 kg] 91.6 kg (12/30 1512)  Intake/Output from previous day: 12/30 0701 - 12/31 0700 In: 75899  Out: 57874 [Urine:42125] Intake/Output this  shift: Total I/O In: -  Out: 4800 [Urine:4800]   Physical Exam Vitals reviewed.  Constitutional:      Appearance: Normal appearance.  Genitourinary:    Comments: Urine moderately red in the bag but clear in the tube on moderate irrigation.  Neurological:     Mental Status: He is alert.     Lab Results:  Recent Labs    09/24/24 0410 09/25/24 0507  WBC 7.6 5.1  HGB 8.5* 8.8*  HCT 27.9* 27.7*  PLT 186 208   BMET Recent Labs    09/24/24 0410 09/25/24 0507  NA 140 141  K 4.1 3.8  CL 107 108  CO2 25 26  GLUCOSE 118* 107*  BUN 22 15  CREATININE 1.09 0.93  CALCIUM  9.0 9.1   PT/INR Recent Labs    09/23/24 0849  LABPROT 13.3  INR 1.0   ABG No results for input(s): PHART, HCO3 in the last 72 hours.  Invalid input(s): PCO2, PO2  Studies/Results: CT PELVIS W CONTRAST Result Date: 09/23/2024 EXAM: CT PELVIS, WITH IV CONTRAST 09/23/2024 12:00:50 PM TECHNIQUE: Axial images were acquired through the pelvis with IV contrast. Reformatted images were reviewed. Automated exposure control, iterative reconstruction,  and/or weight based adjustment of the mA/kV was utilized to reduce the radiation dose to as low as reasonably achievable. COMPARISON: Ultrasound pelvis 09/23/24 CLINICAL HISTORY: eval bladder mass eval bladder mass FINDINGS: BONES: Surgical hardware of the lumbar spine partially visualized. No acute fracture or focal osseous lesion. JOINTS: No dislocation. The joint spaces are normal. SOFT TISSUES: Tiny fat-containing umbilical hernia. INTRAPELVIC CONTENTS: Redemonstration of distended urinary bladder lumen with urine as well as heterogeneous hyperdense mass-like lesion measuring 9.5 x 9.5 x 9.5 cm. Associated foci of gas within this lesion that may represent a mass versus blood clot. No urinary bladder wall thickening. No gas within the urinary bladder wall. Prominent prostate measuring up to 4.7 cm. No hydroureter of the visualized distal ureters. Colonic  diverticulosis. No bowel thickening or dilatation of the visualized portions of the small and large bowel. No free fluid or gas within the peritoneum. No organized fluid collection. IMPRESSION: 1. Distended urinary bladder with urine as well as a 9.5 x 9.5 x 9.5 cm heterogeneous hyperdense intraluminal mass-like lesion containing gas, which may represent tumor versus blood clot. 2. No urinary bladder wall thickening or intramural gas. No hydroureter of the visualized distal ureters. Electronically signed by: Morgane Naveau MD 09/23/2024 12:14 PM EST RP Workstation: [id number]   US  Pelvis Limited Result Date: 09/23/2024 EXAM: PELVIC ULTRASOUND TECHNIQUE: Transabdominal pelvic duplex ultrasound using B-mode/gray scaled imaging with Doppler spectral analysis and color flow was obtained. COMPARISON: CTA GI bleed on 08/29/2024, CT abdomen and pelvis on 10/30/2022. CLINICAL HISTORY: Eval for urinary retention, clot burden. FINDINGS: ULTRASOUND FINDINGS: URINARY BLADDER: Prevoid volume 1099 ml. Postvoid volume - unable to void. Mass-like heterogeneous echogenic lesion within the urinary bladder lumen measuring 10 x 7 x 8 cm, consistent with a volume of 320 ml. PROSTATE: The prostate measures 6 x 4.2 x 6.3 cm with a volume of 83 ml, which is enlarged. FREE FLUID: No free fluid. IMPRESSION: 1. Marked urinary bladder distention with inability to void on postvoid assessment. 2. Mass-like heterogeneous echogenic lesion within the urinary bladder lumen measuring 10 x 7 x 8 cm (approximately 320 mL), compatible with reported clot burden. Electronically signed by: Morgane Naveau MD 09/23/2024 12:08 PM EST RP Workstation: [id number]     Assessment and Plan: Gross hematuria.   He had a few more clots yesterday and his UA is intermittently bloody with a stable Hgb.  He probably still has clot in his bladder.    I will get a pelvic US  to assess and will plan for additional hand irrigation as needed.   Best to keep NPO until  we have the US  results.       LOS: 1 day    Alizay Bronkema 09/25/2024 "

## 2024-09-25 NOTE — Anesthesia Preprocedure Evaluation (Addendum)
 "                                  Anesthesia Evaluation  Patient identified by MRN, date of birth, ID band Patient awake    Reviewed: Allergy & Precautions, NPO status , Patient's Chart, lab work & pertinent test results  History of Anesthesia Complications Negative for: history of anesthetic complications  Airway Mallampati: II  TM Distance: >3 FB Neck ROM: Full    Dental no notable dental hx. (+) Missing,    Pulmonary former smoker   Pulmonary exam normal breath sounds clear to auscultation       Cardiovascular hypertension, (-) angina + Cardiac Stents  (-) Past MI Normal cardiovascular exam Rhythm:Regular Rate:Normal     Neuro/Psych    GI/Hepatic ,GERD  ,,  Endo/Other  neg diabetes    Renal/GU Renal diseaseLab Results      Component                Value               Date                      NA                       141                 09/25/2024                CL                       108                 09/25/2024                K                        3.8                 09/25/2024                CO2                      26                  09/25/2024                BUN                      15                  09/25/2024                CREATININE               0.93                09/25/2024                GFRNONAA                 >60                 09/25/2024                CALCIUM   9.1                 09/25/2024                PHOS                     3.1                 09/25/2024                ALBUMIN                  3.4 (L)             09/25/2024                GLUCOSE                  107 (H)             09/25/2024                Musculoskeletal  (+) Arthritis ,    Abdominal   Peds  Hematology Lab Results      Component                Value               Date                      WBC                      5.1                 09/25/2024                HGB                      8.8 (L)             09/25/2024                 HCT                      27.7 (L)            09/25/2024                MCV                      80.3                09/25/2024                PLT                      208                 09/25/2024              Anesthesia Other Findings   Reproductive/Obstetrics                              Anesthesia Physical Anesthesia Plan  ASA: 3  Anesthesia Plan: General   Post-op Pain Management: Tylenol  PO (pre-op)* and Precedex   Induction: Intravenous  PONV Risk Score and Plan: 4 or greater and Treatment may vary due to age or  medical condition, Ondansetron  and Dexamethasone   Airway Management Planned: LMA  Additional Equipment: None  Intra-op Plan:   Post-operative Plan: Extubation in OR  Informed Consent: I have reviewed the patients History and Physical, chart, labs and discussed the procedure including the risks, benefits and alternatives for the proposed anesthesia with the patient or authorized representative who has indicated his/her understanding and acceptance.     Dental advisory given  Plan Discussed with: CRNA and Surgeon  Anesthesia Plan Comments:          Anesthesia Quick Evaluation  "

## 2024-09-25 NOTE — Assessment & Plan Note (Addendum)
 Gross hematuria with self catheterization on 12/29 He is on Plavix  for CAD, last dose the morning of 12/28 Hgb 9.5, which is about his baseline Hemodynamically stable S/p cystourethroscopy and hand irrigation with normal saline by urology CBI per urology Given ongoing clot retention, he underwent cystoscopy with clot evacuation and fulguration on 12/31 Plan for ongoing monitoring for another 24 hours If urine remains clear, he was dc home with Foley on 1/1 He has urodynamic study scheduled for 1/9

## 2024-09-25 NOTE — Progress Notes (Signed)
 The risk, benefits and alternatives of cystoscopy with clot evacuation was discussed with the patient and his wife.  They voiced understanding and wished to proceed.

## 2024-09-25 NOTE — Assessment & Plan Note (Addendum)
 Resumed amlodipine , bisoprolol  Current BP is 111/69

## 2024-09-25 NOTE — Transfer of Care (Signed)
 Immediate Anesthesia Transfer of Care Note  Patient: Ruben Rodriguez [facility]  Procedure(s) Performed: CYSTOSCOPY, WITH BLADDER FULGURATION  Patient Location: PACU  Anesthesia Type:General  Level of Consciousness: drowsy  Airway & Oxygen Therapy: Patient Spontanous Breathing and Patient connected to face mask oxygen  Post-op Assessment: Report given to RN and Post -op Vital signs reviewed and stable  Post vital signs: Reviewed and stable  Last Vitals:  Vitals Value Taken Time  BP 122/81 09/25/24 13:26  Temp    Pulse 79 09/25/24 13:28  Resp 16 09/25/24 13:28  SpO2 100 % 09/25/24 13:28  Vitals shown include unfiled device data.  Last Pain:  Vitals:   09/25/24 1153  TempSrc: Oral  PainSc: 5       Patients Stated Pain Goal: 4 (09/25/24 1153)  Complications: No notable events documented.

## 2024-09-25 NOTE — Plan of Care (Signed)

## 2024-09-25 NOTE — Assessment & Plan Note (Addendum)
 Acute on chronic blood loss anemia Hgb relatively stable Anemia panel with iron  deficiency Given IV Venofer  300 mg x 1 on 12/30 Can follow up for additional IV iron  outpatient

## 2024-09-25 NOTE — Assessment & Plan Note (Addendum)
 On Plavix  on presentation Cardiology consulted, has stopped Plavix  due to low risk of stent thrombosis Could restart Plavix  in the future if desired No ASA due to prior h/o GI upset Continue bisoprolol 

## 2024-09-25 NOTE — Op Note (Signed)
 Operative Note  Preoperative diagnosis:  1.  Clot urinary retention  Postoperative diagnosis: 1.  Clot urinary retention  Procedure(s): 1.  Cystoscopy with clot evacuation and fulguration  Surgeon: Lonni Han, MD  Assistants:  None  Anesthesia:  General  Complications:  None  EBL: Approximately 600 mL of well-formed clot was evacuated from the bladder  Specimens: 1.  None  Drains/Catheters: 1.  22 French three-way Foley catheter with 10 mL of sterile water in the balloon  Intraoperative findings:   Nonobstructing prostatic urethra with small area of trauma involving the bladder neck from previous Foley catheterizations.  No intravesical abnormalities were seen  Indication:  Ruben Rodriguez is a 78 y.o. male with gross hematuria with clot urinary retention following traumatic Foley insertion while performing clean intermittent catheterization at home.  Despite hand irrigation of his bladder, he still has a residual of approximately 600 mL of clot within the bladder.  He has been consented for the above procedures, voices understanding and wishes to proceed.  Description of procedure:  After informed consent was obtained, the patient was brought to the operating room and general LMA anesthesia was administered. The patient was then placed in the dorsolithotomy position and prepped and draped in the usual sterile fashion. A timeout was performed. A 21 French rigid cystoscope was then inserted into the urethral meatus and advanced into the bladder under direct vision. A complete bladder survey revealed a large amount of old, well-formed clot with no other intravesical pathology.  A Toomey syringe was then used to evacuate all clot from the lumen of the bladder, which measured approximately 600 mL.  There was a small area of trauma involving the bladder neck that was slowly bleeding that was fulgurated until hemostasis was achieved.  A 22 French three-way Foley catheter was then  reinserted into the bladder and placed to gravity drainage.  He tolerated the procedure well and was transferred to the postanesthesia in stable condition.  Plan: Continue CBI overnight.  If his urine remains clear over the next 24 hours, then he will need to be discharged home with his Foley catheter.  He has a urodynamic study scheduled for 10/04/2024 in [facility].

## 2024-09-25 NOTE — Anesthesia Procedure Notes (Signed)
 Procedure Name: LMA Insertion Date/Time: 09/25/2024 12:48 PM  Performed by: Franchot Delon RAMAN, CRNAPre-anesthesia Checklist: Patient identified, Emergency Drugs available, Suction available and Patient being monitored Patient Re-evaluated:Patient Re-evaluated prior to induction Oxygen Delivery Method: Circle System Utilized Preoxygenation: Pre-oxygenation with 100% oxygen Induction Type: IV induction Ventilation: Mask ventilation without difficulty LMA: LMA inserted LMA Size: 5.0 Number of attempts: 1 Airway Equipment and Method: Bite block Placement Confirmation: positive ETCO2 Tube secured with: Tape Dental Injury: Teeth and Oropharynx as per pre-operative assessment

## 2024-09-25 NOTE — Anesthesia Postprocedure Evaluation (Signed)
"   Anesthesia Post Note  Patient: Ruben Rodriguez [facility]  Procedure(s) Performed: CYSTOSCOPY, WITH BLADDER FULGURATION     Patient location during evaluation: PACU Anesthesia Type: General Level of consciousness: awake and alert Pain management: pain level controlled Vital Signs Assessment: post-procedure vital signs reviewed and stable Respiratory status: spontaneous breathing, nonlabored ventilation, respiratory function stable and patient connected to nasal cannula oxygen Cardiovascular status: blood pressure returned to baseline and stable Postop Assessment: no apparent nausea or vomiting Anesthetic complications: no   No notable events documented.  Last Vitals:  Vitals:   09/25/24 1415 09/25/24 1428  BP: 114/66 111/69  Pulse: (!) 57 65  Resp: 10 14  Temp: 36.6 C 36.6 C  SpO2: 95% 97%    Last Pain:  Vitals:   09/25/24 1431  TempSrc:   PainSc: 0-No pain                 Garnette LABOR Elias Bordner      "

## 2024-09-25 NOTE — Assessment & Plan Note (Addendum)
 Does self-catheterization at home Continue CBI Continue Flomax  and Proscar  Will dc with foley following this hospitalization

## 2024-09-25 NOTE — Hospital Course (Addendum)
 78yo with h/o CAD on Plavix , HTN, and BPH who presented on 12/29 with gross hematuria.  He was last hospitalized 12/4-8 for GI bleeding, and was discharged with a foley for urinary retention which was subsequently removed at urology on 12/15; he is performing self-caths at home.  CT with intraluminal mass that was thought to be clot.  Underwent cystoscopy and started on CBI.  Ongoing clots on 12/31 imaging so he underwent repeat cystoscopy with bladder fulguration.

## 2024-09-25 NOTE — Assessment & Plan Note (Addendum)
 Previously did not tolerate statins, Zetia, or PCSK9 inhibitor Diet controlled

## 2024-09-25 NOTE — Progress Notes (Signed)
 " Progress Note   Patient: Ruben Rodriguez FMW:[id number] DOB: 07-06-1946 DOA: 09/23/2024     1 DOS: the patient was seen and examined on 09/25/2024   Brief hospital course: 78yo with h/o CAD on Plavix , HTN, and BPH who presented on 12/29 with gross hematuria.  He was last hospitalized 12/4-8 for GI bleeding, and was discharged with a foley for urinary retention which was subsequently removed at urology on 12/15; he is performing self-caths at home.  CT with intraluminal mass that was thought to be clot.  Underwent cystoscopy and started on CBI.  Ongoing clots on 12/31 imaging so he underwent repeat cystoscopy with bladder fulguration.   Assessment & Plan Gross hematuria Blood clot in bladder Gross hematuria with self catheterization on 12/29 He is on Plavix  for CAD, last dose the morning of 12/28 Hgb 9.5, which is about his baseline Hemodynamically stable S/p cystourethroscopy and hand irrigation with normal saline by urology CBI per urology Given ongoing clot retention, he underwent cystoscopy with clot evacuation and fulguration on 12/31 Plan for ongoing monitoring for another 24 hours If urine remains clear, he was dc home with Foley on 1/1 He has urodynamic study scheduled for 1/9 Iron  deficiency anemia, unspecified Acute on chronic blood loss anemia Hgb relatively stable Anemia panel with iron  deficiency Given IV Venofer  300 mg x 1 on 12/30 Can follow up for additional IV iron  outpatient History of CAD (coronary artery disease) On Plavix  on presentation Cardiology consulted, has stopped Plavix  due to low risk of stent thrombosis Could restart Plavix  in the future if desired No ASA due to prior h/o GI upset Continue bisoprolol  Hyperlipidemia Previously did not tolerate statins, Zetia, or PCSK9 inhibitor Diet controlled Essential hypertension Resumed amlodipine , bisoprolol  Current BP is 111/69 BPH (benign prostatic hyperplasia) Does self-catheterization at home Continue  CBI Continue Flomax  and Proscar  Will dc with foley following this hospitalization      Consultants: Urology Cardiology  Procedures: Cystourethroscopy 12/29 Cystoscopy with clot evacuation and fulguration 12/31  Antibiotics: Ciprofloxacin  x 1  30 Day Unplanned Readmission Risk Score    Flowsheet Row ED to Hosp-Admission (Current) from [facility]23/2024 [facility]  30 Day Unplanned Readmission Risk Score (%) 12.71 Filed at 09/25/2024 0801    This score is the patient's risk of an unplanned readmission within 30 days of being discharged (0 -100%). The score is based on dignosis, age, lab data, medications, orders, and past utilization.   Low:  0-14.9   Medium: 15-21.9   High: 22-29.9   Extreme: 30 and above           Subjective: Anxious about need for another surgery today.  Otherwise without significant concern.   Objective: Vitals:   09/25/24 1415 09/25/24 1428  BP: 114/66 111/69  Pulse: (!) 57 65  Resp: 10 14  Temp: 97.9 F (36.6 C) 97.8 F (36.6 C)  SpO2: 95% 97%    Intake/Output Summary (Last 24 hours) at 09/25/2024 1840 Last data filed at 09/25/2024 1610 Gross per 24 hour  Intake 75699 ml  Output 40575 ml  Net -16275 ml   Filed Weights   09/23/24 0828 09/24/24 1512  Weight: 93.4 kg 91.6 kg    Exam:  General:  Appears calm and comfortable and is in NAD Eyes:  normal lids, iris ENT:  grossly normal hearing, lips & tongue, mmm Cardiovascular:  RRR. No LE edema.  Respiratory:   CTA bilaterally with no wheezes/rales/rhonchi.  Normal respiratory effort. Abdomen:  soft, NT, ND Skin:  no rash or induration seen on limited exam Musculoskeletal:  grossly normal tone BUE/BLE, good ROM, no bony abnormality Psychiatric:  grossly normal mood and affect, speech fluent and appropriate, AOx3 Neurologic:  CN 2-12 grossly intact, moves all extremities in coordinated fashion  Data Reviewed: I have reviewed the patient's  lab results since admission.  Pertinent labs for today include:   Glucose 107 WBC 5.1 Hgb 8.8, stable A1c 5.3 Urine culture pending     Family Communication: Wife was present     Code Status: Full Code  Disposition: Status is: Inpatient Remains inpatient appropriate because: ongoing management     Time spent: 50 minutes  Unresulted Labs (From admission, onward)     Start     Ordered   09/26/24 0500  CBC  Tomorrow morning,   R        09/25/24 1840   09/26/24 0500  Basic metabolic panel with GFR  Tomorrow morning,   R        09/25/24 1840             Author: Delon Herald, MD 09/25/2024 6:40 PM  For on call review [email or web address].            "

## 2024-09-26 ENCOUNTER — Encounter (HOSPITAL_COMMUNITY): Payer: Self-pay | Admitting: Urology

## 2024-09-26 DIAGNOSIS — R310 Gross hematuria: Secondary | ICD-10-CM | POA: Diagnosis not present

## 2024-09-26 DIAGNOSIS — B9689 Other specified bacterial agents as the cause of diseases classified elsewhere: Secondary | ICD-10-CM | POA: Insufficient documentation

## 2024-09-26 LAB — BASIC METABOLIC PANEL WITH GFR
Anion gap: 11 (ref 5–15)
BUN: 19 mg/dL (ref 8–23)
CO2: 23 mmol/L (ref 22–32)
Calcium: 9.1 mg/dL (ref 8.9–10.3)
Chloride: 104 mmol/L (ref 98–111)
Creatinine, Ser: 1.05 mg/dL (ref 0.61–1.24)
GFR, Estimated: >60 mL/min
Glucose, Bld: 126 mg/dL — ABNORMAL HIGH (ref 70–99)
Potassium: 3.8 mmol/L (ref 3.5–5.1)
Sodium: 137 mmol/L (ref 135–145)

## 2024-09-26 LAB — URINE CULTURE: Culture: >=100000 — AB

## 2024-09-26 LAB — CBC
HCT: 26.5 % — ABNORMAL LOW (ref 39.0–52.0)
Hemoglobin: 8.1 g/dL — ABNORMAL LOW (ref 13.0–17.0)
MCH: 24.8 pg — ABNORMAL LOW (ref 26.0–34.0)
MCHC: 30.6 g/dL (ref 30.0–36.0)
MCV: 81 fL (ref 80.0–100.0)
Platelets: 249 K/uL (ref 150–400)
RBC: 3.27 MIL/uL — ABNORMAL LOW (ref 4.22–5.81)
RDW: 14.4 % (ref 11.5–15.5)
WBC: 8.1 K/uL (ref 4.0–10.5)
nRBC: 0 % (ref 0.0–0.2)

## 2024-09-26 MED ORDER — BISOPROLOL FUMARATE 5 MG PO TABS: 5.0000 mg | ORAL_TABLET | Freq: Every day | ORAL | 0 refills | Status: AC

## 2024-09-26 MED ORDER — CIPROFLOXACIN HCL 500 MG PO TABS: 500.0000 mg | ORAL_TABLET | Freq: Two times a day (BID) | ORAL | 0 refills | Status: AC

## 2024-09-26 NOTE — Assessment & Plan Note (Addendum)
 Previously did not tolerate statins, Zetia, or PCSK9 inhibitor Diet controlled

## 2024-09-26 NOTE — Assessment & Plan Note (Addendum)
 Gross hematuria with self catheterization on 12/29 He is on Plavix  for CAD, last dose the morning of 12/28 Hgb 9.5, which is about his baseline Hemodynamically stable S/p cystourethroscopy and hand irrigation with normal saline by urology CBI per urology Given ongoing clot retention, he underwent cystoscopy with clot evacuation and fulguration on 12/31 Plan for ongoing monitoring for another 24 hours If urine remains clear, he was dc home with Foley on 1/1 He has urodynamic study scheduled for 1/9

## 2024-09-26 NOTE — Progress Notes (Signed)
 Urology Inpatient Progress Report  Intv/Subj: Patient underwent cysto, clot evacuation and fulguration yesterday.  Urine has remained clear on slow CBI.  Principal Problem:   Gross hematuria Active Problems:   BPH (benign prostatic hyperplasia)   Essential hypertension   Hyperlipidemia   History of CAD (coronary artery disease)   Iron  deficiency anemia, unspecified   Blood clot in bladder  Current Facility-Administered Medications  Medication Dose Route Frequency Provider Last Rate Last Admin   amLODipine  (NORVASC ) tablet 10 mg  10 mg Oral Daily Winter, Christopher Aaron, MD   10 mg at 09/24/24 1055   artificial tears ophthalmic solution 1 drop  1 drop Both Eyes TID PRN Devere Lonni Righter, MD       bisoprolol  (ZEBETA ) tablet 5 mg  5 mg Oral Daily Winter, Christopher Aaron, MD   5 mg at 09/25/24 1134   Chlorhexidine  Gluconate Cloth 2 % PADS 6 each  6 each Topical Daily Winter, Christopher Aaron, MD   6 each at 09/25/24 1135   finasteride  (PROSCAR ) tablet 5 mg  5 mg Oral QHS Devere Lonni Righter, MD   5 mg at 09/25/24 2151   morphine  (PF) 2 MG/ML injection 2 mg  2 mg Intravenous Q2H PRN Devere Lonni Righter, MD   2 mg at 09/23/24 1811   pantoprazole  (PROTONIX ) EC tablet 40 mg  40 mg Oral BID Winter, Christopher Aaron, MD   40 mg at 09/25/24 2151   psyllium (HYDROCIL/METAMUCIL) 1 packet  1 packet Oral Daily Winter, Christopher Aaron, MD       sodium chloride  irrigation 0.9 % 3,000 mL  3,000 mL Irrigation Continuous Winter, Christopher Aaron, MD   3,000 mL at 09/24/24 9385   sodium chloride  irrigation 0.9 % 3,000 mL  3,000 mL Irrigation Continuous Winter, Christopher Aaron, MD   3,000 mL at 09/25/24 1430   tamsulosin  (FLOMAX ) capsule 0.4 mg  0.4 mg Oral Daily Devere Lonni Righter, MD   0.4 mg at 09/24/24 1055     Objective: Vital: Vitals:   09/25/24 1415 09/25/24 1428 09/25/24 1931 09/26/24 0452  BP: 114/66 111/69 (!) 104/56 134/75  Pulse: (!) 57 65 71 74  Resp:  10 14 19 17   Temp: 97.9 F (36.6 C) 97.8 F (36.6 C) 97.6 F (36.4 C) 97.9 F (36.6 C)  TempSrc:  Oral Oral Oral  SpO2: 95% 97% 96% 94%  Weight:      Height:       I/Os: I/O last 3 completed shifts: In: 75699 [I.V.:600; Nuyzm:76599; IV Piggyback:300] Out: 56824 [Urine:43175]  Physical Exam:  General: Patient is in no apparent distress Lungs: Normal respiratory effort, chest expands symmetrically. Foley: 3-way foley with slow CBI, clear urine no clots  Ext: lower extremities symmetric  Lab Results: Recent Labs    09/24/24 0410 09/25/24 0507 09/26/24 0502  WBC 7.6 5.1 8.1  HGB 8.5* 8.8* 8.1*  HCT 27.9* 27.7* 26.5*   Recent Labs    09/24/24 0410 09/25/24 0507 09/26/24 0502  NA 140 141 137  K 4.1 3.8 3.8  CL 107 108 104  CO2 25 26 23   GLUCOSE 118* 107* 126*  BUN 22 15 19   CREATININE 1.09 0.93 1.05  CALCIUM  9.0 9.1 9.1   Recent Labs    09/23/24 0849  INR 1.0   No results for input(s): LABURIN in the last 72 hours. Results for orders placed or performed during the hospital encounter of 09/23/24  Urine Culture     Status: Abnormal (Preliminary result)   Collection  Time: 09/24/24  7:58 AM   Specimen: Urine, Random  Result Value Ref Range Status   Specimen Description   Final    URINE, RANDOM Performed at [facility], [address] [place], [place] [address]    Special Requests   Final    NONE Reflexed from [id number] Performed at [facility], [address] [place], [place] [address]    Culture (A)  Final    >=100,000 COLONIES/mL KLEBSIELLA PNEUMONIAE SUSCEPTIBILITIES TO FOLLOW Performed at [facility], [address], [place], [place] [address]    Report Status PENDING  Incomplete    Studies/Results: US  PELVIS LIMITED (TRANSABDOMINAL ONLY) Result Date: 09/25/2024 EXAM: PELVIC ULTRASOUND TECHNIQUE: Transabdominal pelvic duplex ultrasound using B-mode/gray scaled imaging with Doppler spectral analysis  and color flow was obtained. COMPARISON: Ultrasound of the pelvis dated 09/23/2024. CLINICAL HISTORY: [id number] Gross hematuria 769855 Gross hematuria. FINDINGS: ULTRASOUND FINDINGS: URINARY BLADDER: Since the previous study, a foley catheter has been placed within the urinary bladder. There is an ovoid, mixed echogenic mass again demonstrated within the bladder lumen, measuring approximately 7.1 x 5.0 x 5.7 cm. There is no definite blood flow evident within the mass. There is now less urine within the urinary bladder secondary to the presence of the foley catheter. FREE FLUID: No free fluid. IMPRESSION: 1. Ovoid, mixed echogenic mass within the bladder lumen measuring approximately 7.1 x 5.0 x 5.7 cm, without definite internal blood flow. This is compatible with a blood clot versus hypovascular tumor. 2. Foley catheter in place. Electronically signed by: Evalene Coho MD 09/25/2024 09:13 AM EST RP Workstation: [id number]    Assessment/Plan: 59 male with gross hematuria and clot urinary retention following traumatic Foley insertion while performing CIC POD1 s/p cysto, clot evac, fulguration.  - CBI clamped on morning rounds.  If remains clear patient can be discharged home with Foley catheter in place. - He is scheduled for urodynamic study next week at [facility]rology and then will have follow-up with Dr. Shane. [place] for discharge from urology perspective    Valli Shank, MD Urology 09/26/2024, 7:46 AM

## 2024-09-26 NOTE — Assessment & Plan Note (Addendum)
 Discussed with pharmacy Has anaphylactic PCN allergy Never trialed on cephalosporins Will dc on Cipro  x 7 days

## 2024-09-26 NOTE — Assessment & Plan Note (Addendum)
 Acute on chronic blood loss anemia Hgb relatively stable Anemia panel with iron  deficiency Given IV Venofer  300 mg x 1 on 12/30 Can follow up for additional IV iron  outpatient

## 2024-09-26 NOTE — Plan of Care (Signed)

## 2024-09-26 NOTE — Assessment & Plan Note (Addendum)
 Does self-catheterization at home No longer on CBI Continue Flomax  and Proscar  Will dc with foley following this hospitalization

## 2024-09-26 NOTE — Progress Notes (Signed)
" °   09/26/24 1001  TOC Brief Assessment  Insurance and Status Reviewed  Patient has primary care physician Yes  Home environment has been reviewed Resides in single family home with spouse  Prior level of function: Independent with ADLs at baseline  Prior/Current Home Services No current home services  Social Drivers of Health Review SDOH reviewed no interventions necessary  Readmission risk has been reviewed Yes  Transition of care needs no transition of care needs at this time    "

## 2024-09-26 NOTE — Assessment & Plan Note (Addendum)
 On Plavix  on presentation Cardiology consulted, has stopped Plavix  due to low risk of stent thrombosis Could restart Plavix  in the future if desired No ASA due to prior h/o GI upset Continue bisoprolol 

## 2024-09-26 NOTE — Discharge Summary (Addendum)
 " Physician Discharge Summary   Patient: Ruben Rodriguez MRN: [id number] DOB: 1946-06-16  Admit date:     09/23/2024  Discharge date: 09/26/2024  Discharge Physician: Delon Herald   PCP: Loreli Kins, MD   Recommendations at discharge:   Keep foley in place Complete antibiotics (Ciprofloxacin  500 mg twice daily for 7 days) Follow up with urology for urodynamic study as scheduled on 1/9 You are being referred for outpatient iron  infusion Follow up with Dr. Loreli in 1-2 weeks Stop Plavix  for now Decrease bisoprolol  dose to 5 mg daily  Discharge Diagnoses: Principal Problem:   Gross hematuria Active Problems:   Essential hypertension   BPH (benign prostatic hyperplasia)   Hyperlipidemia   History of CAD (coronary artery disease)   Iron  deficiency anemia, unspecified   Blood clot in bladder   Urinary tract infection due to Klebsiella species    Hospital Course: 79yo with h/o CAD on Plavix , HTN, and BPH who presented on 12/29 with gross hematuria.  He was last hospitalized 12/4-8 for GI bleeding, and was discharged with a foley for urinary retention which was subsequently removed at urology on 12/15; he is performing self-caths at home.  CT with intraluminal mass that was thought to be clot.  Underwent cystoscopy and started on CBI.  Ongoing clots on 12/31 imaging so he underwent repeat cystoscopy with bladder fulguration.  Assessment and Plan:  Assessment & Plan Gross hematuria Blood clot in bladder Gross hematuria with self catheterization on 12/29 He is on Plavix  for CAD, last dose the morning of 12/28 Hgb 9.5, which is about his baseline Hemodynamically stable S/p cystourethroscopy and hand irrigation with normal saline by urology CBI per urology Given ongoing clot retention, he underwent cystoscopy with clot evacuation and fulguration on 12/31 Plan for ongoing monitoring for another 24 hours If urine remains clear, he was dc home with Foley on 1/1 He has urodynamic  study scheduled for 1/9 Iron  deficiency anemia, unspecified Acute on chronic blood loss anemia Hgb relatively stable Anemia panel with iron  deficiency Given IV Venofer  300 mg x 1 on 12/30 Can follow up for additional IV iron  outpatient History of CAD (coronary artery disease) On Plavix  on presentation Cardiology consulted, has stopped Plavix  due to low risk of stent thrombosis Could restart Plavix  in the future if desired No ASA due to prior h/o GI upset Continue bisoprolol  Hyperlipidemia Previously did not tolerate statins, Zetia, or PCSK9 inhibitor Diet controlled Essential hypertension Resumed amlodipine , bisoprolol  Current BP is 111/69 BPH (benign prostatic hyperplasia) Does self-catheterization at home No longer on CBI Continue Flomax  and Proscar  Will dc with foley following this hospitalization Urinary tract infection due to Klebsiella species Discussed with pharmacy Has anaphylactic PCN allergy Never trialed on cephalosporins Will dc on Cipro  x 7 days      Consultants: Urology Cardiology   Procedures: Cystourethroscopy 12/29 Cystoscopy with clot evacuation and fulguration 12/31   Antibiotics: Ciprofloxacin  x 1   Pain control - [place]  Controlled Substance Reporting System database was reviewed. and patient was instructed, not to drive, operate heavy machinery, perform activities at heights, swimming or participation in water activities or provide baby-sitting services while on Pain, Sleep and Anxiety Medications; until their outpatient Physician has advised to do so again. Also recommended to not to take more than prescribed Pain, Sleep and Anxiety Medications.   Disposition: Home Diet recommendation:  Regular diet DISCHARGE MEDICATION: Allergies as of 09/26/2024       Reactions   Evolocumab  Shortness Of Breath, Rash, Other (  See Comments)   Repatha - Pain, body aches, vision problems- also   Fish Allergy Anaphylaxis, Rash, Other (See Comments)    Tolerates salmon and tuna   Penicillins Anaphylaxis, Dermatitis, Other (See Comments)   Hospitalized, per patient- happened during childhood (severe reaction)   Atorvastatin Other (See Comments)   Muscle aches   Ezetimibe Other (See Comments)   Muscle aches   Zocor [simvastatin] Other (See Comments)   Muscle aches   Amoxicillin Other (See Comments)   MD advised patient to not take this, either   Crestor  [rosuvastatin  Calcium ] Other (See Comments)   Joint and muscle pain        Medication List     STOP taking these medications    clopidogrel  75 MG tablet Commonly known as: PLAVIX    polyethylene glycol 17 g packet Commonly known as: MIRALAX  / GLYCOLAX        TAKE these medications    acetaminophen  650 MG CR tablet Commonly known as: TYLENOL  Take 650-1,300 mg by mouth daily as needed for pain.   amLODipine  10 MG tablet Commonly known as: NORVASC  Take 10 mg by mouth daily.   bisoprolol  5 MG tablet Commonly known as: ZEBETA  Take 1 tablet (5 mg total) by mouth daily. What changed:  medication strength how much to take when to take this reasons to take this   ciprofloxacin  500 MG tablet Commonly known as: Cipro  Take 1 tablet (500 mg total) by mouth 2 (two) times daily for 7 days.   diphenhydrAMINE  25 MG tablet Commonly known as: BENADRYL  Take 25 mg by mouth daily as needed for itching.   ferrous sulfate  325 (65 FE) MG tablet Take 1 tablet (325 mg total) by mouth daily with breakfast.   finasteride  5 MG tablet Commonly known as: PROSCAR  Take 5 mg by mouth at bedtime.   fluocinonide ointment 0.05 % Commonly known as: LIDEX Apply 1 Application topically daily as needed (dryness inside ears).   Metamucil 4 in 1 Fiber 43 % Powd Generic drug: Psyllium Take 4-8 g by mouth See admin instructions. Mix 4-8 grams (2 teaspoonsful) into a glass of water and drink by mouth once a day   nitroGLYCERIN  0.4 MG SL tablet Commonly known as: Nitrostat  Place 1 tablet  (0.4 mg total) under the tongue every 5 (five) minutes as needed.   pantoprazole  40 MG tablet Commonly known as: PROTONIX  Take 40 mg by mouth in the morning and at bedtime. What changed: Another medication with the same name was removed. Continue taking this medication, and follow the directions you see here.   Refresh Tears 0.5 % Soln Generic drug: carboxymethylcellulose Place 1 drop into both eyes 3 (three) times daily as needed (for dryness).   tamsulosin  0.4 MG Caps capsule Commonly known as: FLOMAX  Take 1 capsule (0.4 mg total) by mouth daily.        Follow-up Information     [facility] Follow up on 10/04/2024.   Why: Urodynamic study at 7:45 AM Contact information: [address] [place] [address] [place]  [address] [phone]        Loreli Kins, MD. Schedule an appointment as soon as possible for a visit in 1 week(s).   Specialty: Family Medicine Contact information: [address] [place] [address] [phone]                Discharge Exam:   Subjective: Urine in foley has been clear, seen by urology, feels ready for dc today.  Objective: Vitals:   09/25/24 1931 09/26/24 0452  BP: (!) 104/56 134/75  Pulse: 71 74  Resp: 19 17  Temp: 97.6 F (36.4 C) 97.9 F (36.6 C)  SpO2: 96% 94%    Intake/Output Summary (Last 24 hours) at 09/26/2024 1106 Last data filed at 09/26/2024 0900 Gross per 24 hour  Intake 1300 ml  Output 6400 ml  Net -5100 ml   Filed Weights   09/23/24 0828 09/24/24 1512  Weight: 93.4 kg 91.6 kg    Exam:  General:  Appears calm and comfortable and is in NAD Eyes:  normal lids, iris ENT:  grossly normal hearing, lips & tongue, mmm Cardiovascular:  RRR. No LE edema.  Respiratory:   CTA bilaterally with no wheezes/rales/rhonchi.  Normal respiratory effort. Abdomen:  soft, NT, ND Skin:  no rash or induration seen on limited exam Musculoskeletal:  grossly normal tone BUE/BLE, good  ROM, no bony abnormality Psychiatric:  grossly normal mood and affect, speech fluent and appropriate, AOx3 Neurologic:  CN 2-12 grossly intact, moves all extremities in coordinated fashion  Data Reviewed: I have reviewed the patient's lab results since admission.  Pertinent labs for today include:   Glucose 126 WBC 8.1 Hgb 8.1 Urine culture >100k colonies Klebsiella pneumoniae    Condition at discharge: stable  The results of significant diagnostics from this hospitalization (including imaging, microbiology, ancillary and laboratory) are listed below for reference.   Imaging Studies: US  PELVIS LIMITED (TRANSABDOMINAL ONLY) Result Date: 09/25/2024 EXAM: PELVIC ULTRASOUND TECHNIQUE: Transabdominal pelvic duplex ultrasound using B-mode/gray scaled imaging with Doppler spectral analysis and color flow was obtained. COMPARISON: Ultrasound of the pelvis dated 09/23/2024. CLINICAL HISTORY: [id number] Gross hematuria [id number] Gross hematuria. FINDINGS: ULTRASOUND FINDINGS: URINARY BLADDER: Since the previous study, a foley catheter has been placed within the urinary bladder. There is an ovoid, mixed echogenic mass again demonstrated within the bladder lumen, measuring approximately 7.1 x 5.0 x 5.7 cm. There is no definite blood flow evident within the mass. There is now less urine within the urinary bladder secondary to the presence of the foley catheter. FREE FLUID: No free fluid. IMPRESSION: 1. Ovoid, mixed echogenic mass within the bladder lumen measuring approximately 7.1 x 5.0 x 5.7 cm, without definite internal blood flow. This is compatible with a blood clot versus hypovascular tumor. 2. Foley catheter in place. Electronically signed by: Evalene Coho MD 09/25/2024 09:13 AM EST RP Workstation: [id number]   CT PELVIS W CONTRAST Result Date: 09/23/2024 EXAM: CT PELVIS, WITH IV CONTRAST 09/23/2024 12:00:50 PM TECHNIQUE: Axial images were acquired through the pelvis with IV contrast. Reformatted  images were reviewed. Automated exposure control, iterative reconstruction, and/or weight based adjustment of the mA/kV was utilized to reduce the radiation dose to as low as reasonably achievable. COMPARISON: Ultrasound pelvis 09/23/24 CLINICAL HISTORY: eval bladder mass eval bladder mass FINDINGS: BONES: Surgical hardware of the lumbar spine partially visualized. No acute fracture or focal osseous lesion. JOINTS: No dislocation. The joint spaces are normal. SOFT TISSUES: Tiny fat-containing umbilical hernia. INTRAPELVIC CONTENTS: Redemonstration of distended urinary bladder lumen with urine as well as heterogeneous hyperdense mass-like lesion measuring 9.5 x 9.5 x 9.5 cm. Associated foci of gas within this lesion that may represent a mass versus blood clot. No urinary bladder wall thickening. No gas within the urinary bladder wall. Prominent prostate measuring up to 4.7 cm. No hydroureter of the visualized distal ureters. Colonic diverticulosis. No bowel thickening or dilatation of the visualized portions of the small and large bowel. No free fluid  or gas within the peritoneum. No organized fluid collection. IMPRESSION: 1. Distended urinary bladder with urine as well as a 9.5 x 9.5 x 9.5 cm heterogeneous hyperdense intraluminal mass-like lesion containing gas, which may represent tumor versus blood clot. 2. No urinary bladder wall thickening or intramural gas. No hydroureter of the visualized distal ureters. Electronically signed by: Morgane Naveau MD 09/23/2024 12:14 PM EST RP Workstation: [id number]   US  Pelvis Limited Result Date: 09/23/2024 EXAM: PELVIC ULTRASOUND TECHNIQUE: Transabdominal pelvic duplex ultrasound using B-mode/gray scaled imaging with Doppler spectral analysis and color flow was obtained. COMPARISON: CTA GI bleed on 08/29/2024, CT abdomen and pelvis on 10/30/2022. CLINICAL HISTORY: Eval for urinary retention, clot burden. FINDINGS: ULTRASOUND FINDINGS: URINARY BLADDER: Prevoid volume 1099  ml. Postvoid volume - unable to void. Mass-like heterogeneous echogenic lesion within the urinary bladder lumen measuring 10 x 7 x 8 cm, consistent with a volume of 320 ml. PROSTATE: The prostate measures 6 x 4.2 x 6.3 cm with a volume of 83 ml, which is enlarged. FREE FLUID: No free fluid. IMPRESSION: 1. Marked urinary bladder distention with inability to void on postvoid assessment. 2. Mass-like heterogeneous echogenic lesion within the urinary bladder lumen measuring 10 x 7 x 8 cm (approximately 320 mL), compatible with reported clot burden. Electronically signed by: Morgane Naveau MD 09/23/2024 12:08 PM EST RP Workstation: [id number]   CT ANGIO GI BLEED Result Date: 08/29/2024 CLINICAL DATA:  Blood in stool.  Abdominal pain. EXAM: CTA ABDOMEN AND PELVIS WITHOUT AND WITH CONTRAST TECHNIQUE: Multidetector CT imaging of the abdomen and pelvis was performed using the standard protocol during bolus administration of intravenous contrast. Multiplanar reconstructed images and MIPs were obtained and reviewed to evaluate the vascular anatomy. RADIATION DOSE REDUCTION: This exam was performed according to the departmental dose-optimization program which includes automated exposure control, adjustment of the mA and/or kV according to patient size and/or use of iterative reconstruction technique. CONTRAST:  OMNIPAQUE  IOHEXOL  350 MG/ML SOLN COMPARISON:  CT 05/29/2023 FINDINGS: VASCULAR Aorta: Normal caliber aorta without aneurysm, dissection, vasculitis or significant stenosis. Moderate atherosclerosis. Celiac: Patent without evidence of aneurysm, dissection, vasculitis or significant stenosis. SMA: Plaque at the origin of the SMA causes less than 50% luminal stenosis. No aneurysm, dissection, or vasculitis. Renals: Both renal arteries are patent without evidence of aneurysm, dissection, vasculitis, fibromuscular dysplasia or significant stenosis. IMA: Patent without evidence of aneurysm, dissection, vasculitis or  significant stenosis. Inflow: Patent without evidence of aneurysm, dissection, vasculitis or significant stenosis. Proximal Outflow: Bilateral common femoral and visualized portions of the superficial and profunda femoral arteries are patent without evidence of aneurysm, dissection, vasculitis or significant stenosis. Veins: Venous phase imaging demonstrates patency of the IVC and iliac veins. Portal, splenic, and mesenteric veins are patent. No portal venous or mesenteric gas. Review of the MIP images confirms the above findings. NON-VASCULAR Lower chest: The heart is mildly enlarged. No basilar airspace disease or pleural effusion. Hepatobiliary: Stable cyst in the left lobe of the liver. No further follow-up imaging is recommended. No suspicious liver lesion. Noncalcified gallstone in the gallbladder. No pericholecystic inflammation. No biliary dilatation. Pancreas: No ductal dilatation or inflammation. Spleen: Normal in size without focal abnormality. Adrenals/Urinary Tract: No adrenal nodule. No hydronephrosis. No evidence of renal inflammation. Bilateral renal cysts. No further follow-up imaging is recommended. The urinary bladder is prominently distended. Bladder dome diverticula again seen. No bladder wall thickening. Stomach/Bowel: Findings suspicious for active bleed with contrast accumulation within redundant sigmoid colon, series 5, image 119. This increases on  venous phase imaging, series 11 images 47 through 52. There are multiple colonic diverticula in this region as well as throughout the colon, but no discretely inflamed diverticula. Moderate volume of colonic stool in the proximal colon. No small bowel obstruction or inflammation. Areas of hyperdensity within the mid small bowel are present on precontrast exam and consistent with ingested material. The stomach is nondistended. Lymphatic: No lymphadenopathy. Reproductive: Enlarged prostate gland causes mass effect on the bladder base. Other: No free  air or ascites. No abdominopelvic collection. Small fat containing bilateral inguinal hernias, left greater than right. Small fat containing umbilical hernia. Musculoskeletal: Postsurgical change L3 through L5. Intact hardware. 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 There are no acute or suspicious osseous abnormalities. IMPRESSION: VASCULAR 1. Findings suspicious for active bleed within redundant sigmoid colon. 2.  Aortic Atherosclerosis (ICD10-I70.0). NON-VASCULAR 1. Diffuse colonic diverticulosis without focal diverticulitis. 2. Gallstone without cholecystitis. 3. Enlarged prostate gland causing mass effect on the bladder base. Prominently distended urinary bladder with bladder diverticula, query chronic bladder outlet obstruction. Electronically Signed   By: Andrea Gasman M.D.   On: 08/29/2024 15:25    Microbiology: Results for orders placed or performed during the hospital encounter of 09/23/24  Urine Culture     Status: Abnormal   Collection Time: 09/24/24  7:58 AM   Specimen: Urine, Random  Result Value Ref Range Status   Specimen Description   Final    URINE, RANDOM Performed at [facility], [address], [place], [place] [address]    Special Requests   Final    NONE Reflexed from [id number] Performed at [facility], [address], [place], [place] [address]    Culture >=100,000 COLONIES/mL KLEBSIELLA PNEUMONIAE (A)  Final   Report Status 09/26/2024 FINAL  Final   Organism ID, Bacteria KLEBSIELLA PNEUMONIAE (A)  Final      Susceptibility   Klebsiella pneumoniae - MIC*    AMPICILLIN >=32 RESISTANT Resistant     CEFAZOLIN (URINE) Value in next row Sensitive      2 SENSITIVEThis is a modified FDA-approved test that has been validated and its performance characteristics determined by the reporting laboratory.  This laboratory is certified under the Clinical Laboratory Improvement Amendments CLIA as qualified to perform high complexity  clinical laboratory testing.    CEFEPIME Value in next row Sensitive      2 SENSITIVEThis is a modified FDA-approved test that has been validated and its performance characteristics determined by the reporting laboratory.  This laboratory is certified under the Clinical Laboratory Improvement Amendments CLIA as qualified to perform high complexity clinical laboratory testing.    ERTAPENEM Value in next row Sensitive      2 SENSITIVEThis is a modified FDA-approved test that has been validated and its performance characteristics determined by the reporting laboratory.  This laboratory is certified under the Clinical Laboratory Improvement Amendments CLIA as qualified to perform high complexity clinical laboratory testing.    CEFTRIAXONE  Value in next row Sensitive      2 SENSITIVEThis is a modified FDA-approved test that has been validated and its performance characteristics determined by the reporting laboratory.  This laboratory is certified under the Clinical Laboratory Improvement Amendments CLIA as qualified to perform high complexity clinical laboratory testing.    CIPROFLOXACIN  Value in next row Sensitive      2 SENSITIVEThis is a modified FDA-approved test that has been validated and its performance characteristics determined by the reporting laboratory.  This laboratory is certified  under the Clinical Laboratory Improvement Amendments CLIA as qualified to perform high complexity clinical laboratory testing.    GENTAMICIN  Value in next row Sensitive      2 SENSITIVEThis is a modified FDA-approved test that has been validated and its performance characteristics determined by the reporting laboratory.  This laboratory is certified under the Clinical Laboratory Improvement Amendments CLIA as qualified to perform high complexity clinical laboratory testing.    NITROFURANTOIN Value in next row Intermediate      2 SENSITIVEThis is a modified FDA-approved test that has been validated and its performance  characteristics determined by the reporting laboratory.  This laboratory is certified under the Clinical Laboratory Improvement Amendments CLIA as qualified to perform high complexity clinical laboratory testing.    TRIMETH/SULFA Value in next row Sensitive      2 SENSITIVEThis is a modified FDA-approved test that has been validated and its performance characteristics determined by the reporting laboratory.  This laboratory is certified under the Clinical Laboratory Improvement Amendments CLIA as qualified to perform high complexity clinical laboratory testing.    AMPICILLIN/SULBACTAM Value in next row Sensitive      2 SENSITIVEThis is a modified FDA-approved test that has been validated and its performance characteristics determined by the reporting laboratory.  This laboratory is certified under the Clinical Laboratory Improvement Amendments CLIA as qualified to perform high complexity clinical laboratory testing.    PIP/TAZO Value in next row Sensitive      <=4 SENSITIVEThis is a modified FDA-approved test that has been validated and its performance characteristics determined by the reporting laboratory.  This laboratory is certified under the Clinical Laboratory Improvement Amendments CLIA as qualified to perform high complexity clinical laboratory testing.    MEROPENEM Value in next row Sensitive      <=4 SENSITIVEThis is a modified FDA-approved test that has been validated and its performance characteristics determined by the reporting laboratory.  This laboratory is certified under the Clinical Laboratory Improvement Amendments CLIA as qualified to perform high complexity clinical laboratory testing.    * >=100,000 COLONIES/mL KLEBSIELLA PNEUMONIAE    Labs: CBC: Recent Labs  Lab 09/23/24 0849 09/23/24 1959 09/24/24 0410 09/25/24 0507 09/26/24 0502  WBC 10.6* 8.5 7.6 5.1 8.1  NEUTROABS 9.2*  --   --   --   --   HGB 9.5* 7.9* 8.5* 8.8* 8.1*  HCT 30.1* 25.5* 27.9* 27.7* 26.5*  MCV 80.3  83.3 83.0 80.3 81.0  PLT 211 186 186 208 249   Basic Metabolic Panel: Recent Labs  Lab 09/23/24 0849 09/23/24 1959 09/24/24 0410 09/25/24 0507 09/26/24 0502  NA 139  --  140 141 137  K 3.3*  --  4.1 3.8 3.8  CL 104  --  107 108 104  CO2 23  --  25 26 23   GLUCOSE 180*  --  118* 107* 126*  BUN 14  --  22 15 19   CREATININE 1.19  --  1.09 0.93 1.05  CALCIUM  9.2  --  9.0 9.1 9.1  MG  --  2.1  --  2.1  --   PHOS  --   --   --  3.1  --    Liver Function Tests: Recent Labs  Lab 09/23/24 0849 09/25/24 0507  AST 13*  --   ALT 12  --   ALKPHOS 43  --   BILITOT 0.7  --   PROT 6.3*  --   ALBUMIN 3.8 3.4*   CBG: No results for input(s): GLUCAP in  the last 168 hours.  Discharge time spent: greater than 30 minutes.  Signed: Delon Herald, MD [facility] 09/26/2024 "

## 2024-09-26 NOTE — Assessment & Plan Note (Addendum)
 Resumed amlodipine , bisoprolol  Current BP is 111/69

## 2024-09-27 ENCOUNTER — Other Ambulatory Visit: Payer: Self-pay | Admitting: Cardiology

## 2024-10-07 LAB — LAB REPORT - SCANNED: EGFR: 57

## 2024-10-21 NOTE — Progress Notes (Unsigned)
 "    HPI: FU CAD. Renal Dopplers November 2018 showed no renal artery stenosis. Cardiac CTA (08/16/21); Calcium  score 429, severe stenosis in the mid LAD, mild to moderate mid right coronary artery disease at 40 to 50%, moderate disease in the posterior lateral at 50 to 69%.  LAD lesion significant by FFR.  Abdominal ultrasound December 2022 showed no aneurysm. Carotid Doppler September 2023 showed no significant stenosis of the internal carotid arteries.  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  Patient had follow-up catheterization December 2024 demonstrating 90% mid LAD, 60% distal LAD and 40% PDA.  Patient had PCI of the mid LAD lesion with drug-eluting stent.  Since last seen, patient denies dyspnea, chest pain or syncope.  He was recently discharged following admission for GI bleed felt secondary to diverticulosis by his report and also hematuria.  This has resolved after holding his Plavix .  Current Outpatient Medications  Medication Sig Dispense Refill   acetaminophen  (TYLENOL ) 650 MG CR tablet Take 650-1,300 mg by mouth daily as needed for pain.     amLODipine  (NORVASC ) 10 MG tablet Take 10 mg by mouth daily.     bisoprolol  (ZEBETA ) 5 MG tablet Take 1 tablet (5 mg total) by mouth daily. 30 tablet 0   diphenhydrAMINE  (BENADRYL ) 25 MG tablet Take 25 mg by mouth daily as needed for itching.     ferrous sulfate  325 (65 FE) MG tablet Take 1 tablet (325 mg total) by mouth daily with breakfast. 30 tablet 3   finasteride  (PROSCAR ) 5 MG tablet Take 5 mg by mouth at bedtime.     fluocinonide ointment (LIDEX) 0.05 % Apply 1 Application topically daily as needed (dryness inside ears).     METAMUCIL 4 IN 1 FIBER 43 % POWD Take 4-8 g by mouth See admin instructions. Mix 4-8 grams (2 teaspoonsful) into a glass of water and drink by mouth once a day     nitroGLYCERIN  (NITROSTAT ) 0.4 MG SL tablet Place 1 tablet (0.4 mg total) under the tongue every 5 (five)  minutes as needed. 25 tablet 2   pantoprazole  (PROTONIX ) 40 MG tablet Take 40 mg by mouth in the morning and at bedtime.     REFRESH TEARS 0.5 % SOLN Place 1 drop into both eyes 3 (three) times daily as needed (for dryness).     tamsulosin  (FLOMAX ) 0.4 MG CAPS capsule Take 1 capsule (0.4 mg total) by mouth daily. 30 capsule 0   No current facility-administered medications for this visit.     Past Medical History:  Diagnosis Date   Arthritis    BPH (benign prostatic hyperplasia)    Chronic back pain    Family history of adverse reaction to anesthesia    father died during carotid artery surgery   GERD (gastroesophageal reflux disease)    Hyperlipidemia    Hypertension    Pneumonia    Spinal stenosis     Past Surgical History:  Procedure Laterality Date   BIOPSY  05/31/2023   Procedure: BIOPSY;  Surgeon: Dianna Specking, MD;  Location: [facility] ENDOSCOPY;  Service: Gastroenterology;;   COLONOSCOPY     CORONARY PRESSURE/FFR WITH 3D MAPPING N/A 08/31/2023   Procedure: Coronary Pressure/FFR w/3D Mapping;  Surgeon: Wonda Sharper, MD;  Location: [facility] INVASIVE CV LAB;  Service: Cardiovascular;  Laterality: N/A;   CORONARY STENT INTERVENTION N/A 08/31/2023   Procedure: CORONARY STENT INTERVENTION;  Surgeon: Wonda Sharper, MD;  Location: [facility] INVASIVE CV LAB;  Service: Cardiovascular;  Laterality: N/A;   CYSTOSCOPY WITH FULGERATION N/A 09/25/2024   Procedure: CYSTOSCOPY, WITH BLADDER FULGURATION;  Surgeon: Devere Lonni Righter, MD;  Location: [facility] ORS;  Service: Urology;  Laterality: N/A;  CYSTOSCOPY WITH EVACATION CLOTS AND FULGURATION OF BLEEDERS   ESOPHAGOGASTRODUODENOSCOPY (EGD) WITH PROPOFOL  N/A 05/31/2023   Procedure: ESOPHAGOGASTRODUODENOSCOPY (EGD) WITH PROPOFOL ;  Surgeon: Dianna Specking, MD;  Location: [facility] ENDOSCOPY;  Service: Gastroenterology;  Laterality: N/A;   FINGER SURGERY     to remove a tumor from right little finger   LAMINECTOMY WITH POSTERIOR LATERAL ARTHRODESIS LEVEL 2 N/A  08/01/2018   Procedure: LUMBAR THREE- LUMBAR FOUR, LUMBAR FOUR-LUMBAR FIVE DECOMPRESSION, LUMBAR THREE-LUMBAR FOUR, LUMBAR FOUR-LUMBAR FIVE POSTEROLATERAL ARTHRODESIS;  Surgeon: Alix Charleston, MD;  Location: [facility] OR;  Service: Neurosurgery;  Laterality: N/A;   LEFT HEART CATH AND CORONARY ANGIOGRAPHY N/A 08/27/2021   Procedure: LEFT HEART CATH AND CORONARY ANGIOGRAPHY;  Surgeon: Wonda Sharper, MD;  Location: [facility] INVASIVE CV LAB;  Service: Cardiovascular;  Laterality: N/A;   LEFT HEART CATH AND CORONARY ANGIOGRAPHY N/A 08/31/2023   Procedure: LEFT HEART CATH AND CORONARY ANGIOGRAPHY;  Surgeon: Wonda Sharper, MD;  Location: [facility] INVASIVE CV LAB;  Service: Cardiovascular;  Laterality: N/A;    Social History   Socioeconomic History   Marital status: Married    Spouse name: Not on file   Number of children: 2   Years of education: Not on file   Highest education level: Not on file  Occupational History   Not on file  Tobacco Use   Smoking status: Former   Smokeless tobacco: Never  Vaping Use   Vaping status: Never Used  Substance and Sexual Activity   Alcohol  use: No    Comment: Occasional   Drug use: No   Sexual activity: Not on file  Other Topics Concern   Not on file  Social History Narrative   Not on file   Social Drivers of Health   Tobacco Use: Medium Risk (10/23/2024)   Patient History    Smoking Tobacco Use: Former    Smokeless Tobacco Use: Never    Passive Exposure: Not on Actuary Strain: Not on file  Food Insecurity: No Food Insecurity (09/24/2024)   Epic    Worried About Programme Researcher, Broadcasting/film/video in the Last Year: Never true    Ran Out of Food in the Last Year: Never true  Transportation Needs: No Transportation Needs (09/24/2024)   Epic    Lack of Transportation (Medical): No    Lack of Transportation (Non-Medical): No  Physical Activity: Not on file  Stress: Not on file  Social Connections: Socially Integrated (09/24/2024)   Social Connection and  Isolation Panel    Frequency of Communication with Friends and Family: More than three times a week    Frequency of Social Gatherings with Friends and Family: More than three times a week    Attends Religious Services: More than 4 times per year    Active Member of [employer] or Organizations: Yes    Attends Banker Meetings: More than 4 times per year    Marital Status: Married  Catering Manager Violence: Not At Risk (09/24/2024)   Epic    Fear of Current or Ex-Partner: No    Emotionally Abused: No    Physically Abused: No    Sexually Abused: No  Depression (PHQ2-9): Not on file  Alcohol  Screen: Not on file  Housing: Low Risk (09/24/2024)   Epic    Unable to Pay for Housing  in the Last Year: No    Number of Times Moved in the Last Year: 0    Homeless in the Last Year: No  Utilities: Not At Risk (09/24/2024)   Epic    Threatened with loss of utilities: No  Health Literacy: Not on file    Family History  Problem Relation Age of Onset   Hypertension Mother     ROS: no fevers or chills, productive cough, hemoptysis, dysphasia, odynophagia, melena, hematochezia, dysuria, hematuria, rash, seizure activity, orthopnea, PND, pedal edema, claudication. Remaining systems are negative.  Physical Exam: Well-developed well-nourished in no acute distress.  Skin is warm and dry.  HEENT is normal.  Neck is supple.  Chest is clear to auscultation with normal expansion.  Cardiovascular exam is regular rate and rhythm.  Abdominal exam nontender or distended. No masses palpated. Extremities show no edema. neuro grossly intact   A/P  1 coronary artery disease-patient has had previous PCI of his LAD.  Patient is intolerant to statins.  Patient had a recent GI bleed and hematuria.  His Plavix  was discontinued.  Aspirin  previously discontinued secondary to ulcer.  Will resume aspirin  81 mg daily to see if he tolerates.  2 hypertension-blood pressure borderline; he is to resume  bisoprolol  at 2.5 mg daily and follow.  3 hyperlipidemia-intolerant to statins, Zetia or PCSK9 inhibitor.  Continue diet.  Redell Shallow, MD    "

## 2024-10-23 ENCOUNTER — Ambulatory Visit: Attending: Internal Medicine | Admitting: Cardiology

## 2024-10-23 ENCOUNTER — Encounter: Payer: Self-pay | Admitting: Cardiology

## 2024-10-23 VITALS — BP 138/90 | HR 100 | Ht 69.0 in | Wt 211.0 lb

## 2024-10-23 DIAGNOSIS — I25118 Atherosclerotic heart disease of native coronary artery with other forms of angina pectoris: Secondary | ICD-10-CM | POA: Diagnosis not present

## 2024-10-23 DIAGNOSIS — E785 Hyperlipidemia, unspecified: Secondary | ICD-10-CM

## 2024-10-23 DIAGNOSIS — I10 Essential (primary) hypertension: Secondary | ICD-10-CM

## 2024-10-23 MED ORDER — ASPIRIN 81 MG PO TBEC: 81.0000 mg | DELAYED_RELEASE_TABLET | Freq: Every day | ORAL | Status: AC

## 2024-10-23 NOTE — Patient Instructions (Signed)
 Medication Instructions:   START ASPIRIN  81 MG ONCE DAILY  *If you need a refill on your cardiac medications before your next appointment, please call your pharmacy*  Follow-Up: At [facility], you and your health needs are our priority.  As part of our continuing mission to provide you with exceptional heart care, our providers are all part of one team.  This team includes your primary Cardiologist (physician) and Advanced Practice Providers or APPs (Physician Assistants and Nurse Practitioners) who all work together to provide you with the care you need, when you need it.  Your next appointment:   6 month(s)  Provider:   Redell Shallow, MD
